# Patient Record
Sex: Female | Born: 1955 | Race: White | Hispanic: No | Marital: Married | Smoking: Never smoker
Health system: Southern US, Community
[De-identification: ages and names within clinical notes are randomized; demographics above are authoritative.]

## PROBLEM LIST (undated history)

## (undated) DIAGNOSIS — I209 Angina pectoris, unspecified: Secondary | ICD-10-CM

## (undated) DIAGNOSIS — K529 Noninfective gastroenteritis and colitis, unspecified: Secondary | ICD-10-CM

## (undated) DIAGNOSIS — R609 Edema, unspecified: Secondary | ICD-10-CM

## (undated) DIAGNOSIS — N390 Urinary tract infection, site not specified: Secondary | ICD-10-CM

## (undated) DIAGNOSIS — N3020 Other chronic cystitis without hematuria: Secondary | ICD-10-CM

## (undated) DIAGNOSIS — D649 Anemia, unspecified: Secondary | ICD-10-CM

## (undated) DIAGNOSIS — I639 Cerebral infarction, unspecified: Secondary | ICD-10-CM

## (undated) DIAGNOSIS — L631 Alopecia universalis: Secondary | ICD-10-CM

## (undated) DIAGNOSIS — J069 Acute upper respiratory infection, unspecified: Secondary | ICD-10-CM

## (undated) DIAGNOSIS — K219 Gastro-esophageal reflux disease without esophagitis: Secondary | ICD-10-CM

## (undated) DIAGNOSIS — F419 Anxiety disorder, unspecified: Secondary | ICD-10-CM

## (undated) DIAGNOSIS — Z5189 Encounter for other specified aftercare: Secondary | ICD-10-CM

## (undated) DIAGNOSIS — I8390 Asymptomatic varicose veins of unspecified lower extremity: Secondary | ICD-10-CM

## (undated) DIAGNOSIS — F32A Depression, unspecified: Secondary | ICD-10-CM

## (undated) DIAGNOSIS — M1990 Unspecified osteoarthritis, unspecified site: Secondary | ICD-10-CM

## (undated) DIAGNOSIS — Z9889 Other specified postprocedural states: Secondary | ICD-10-CM

## (undated) DIAGNOSIS — R51 Headache: Secondary | ICD-10-CM

## (undated) DIAGNOSIS — G459 Transient cerebral ischemic attack, unspecified: Secondary | ICD-10-CM

## (undated) DIAGNOSIS — R112 Nausea with vomiting, unspecified: Secondary | ICD-10-CM

## (undated) DIAGNOSIS — E7800 Pure hypercholesterolemia, unspecified: Secondary | ICD-10-CM

## (undated) DIAGNOSIS — F329 Major depressive disorder, single episode, unspecified: Secondary | ICD-10-CM

## (undated) HISTORY — PX: ABDOMINAL HYSTERECTOMY: SHX81

---

## 1977-07-25 DIAGNOSIS — IMO0001 Reserved for inherently not codable concepts without codable children: Secondary | ICD-10-CM

## 1977-07-25 DIAGNOSIS — Z5189 Encounter for other specified aftercare: Secondary | ICD-10-CM

## 1977-07-25 HISTORY — DX: Encounter for other specified aftercare: Z51.89

## 1977-07-25 HISTORY — DX: Reserved for inherently not codable concepts without codable children: IMO0001

## 2003-08-22 ENCOUNTER — Inpatient Hospital Stay (HOSPITAL_COMMUNITY): Admission: RE | Admit: 2003-08-22 | Discharge: 2003-08-26 | Payer: Self-pay | Admitting: Orthopedic Surgery

## 2003-09-01 ENCOUNTER — Encounter: Admission: RE | Admit: 2003-09-01 | Discharge: 2003-09-01 | Payer: Self-pay | Admitting: Family Medicine

## 2003-10-02 ENCOUNTER — Inpatient Hospital Stay (HOSPITAL_COMMUNITY): Admission: RE | Admit: 2003-10-02 | Discharge: 2003-10-04 | Payer: Self-pay | Admitting: Orthopedic Surgery

## 2003-11-14 ENCOUNTER — Ambulatory Visit (HOSPITAL_BASED_OUTPATIENT_CLINIC_OR_DEPARTMENT_OTHER): Admission: RE | Admit: 2003-11-14 | Discharge: 2003-11-14 | Payer: Self-pay | Admitting: Orthopedic Surgery

## 2003-11-14 ENCOUNTER — Ambulatory Visit: Admission: RE | Admit: 2003-11-14 | Discharge: 2003-11-14 | Payer: Self-pay | Admitting: Orthopedic Surgery

## 2003-12-08 ENCOUNTER — Ambulatory Visit (HOSPITAL_BASED_OUTPATIENT_CLINIC_OR_DEPARTMENT_OTHER): Admission: RE | Admit: 2003-12-08 | Discharge: 2003-12-08 | Payer: Self-pay | Admitting: Orthopedic Surgery

## 2004-07-12 ENCOUNTER — Ambulatory Visit: Payer: Self-pay | Admitting: Family Medicine

## 2004-07-25 HISTORY — PX: JOINT REPLACEMENT: SHX530

## 2006-04-19 ENCOUNTER — Inpatient Hospital Stay (HOSPITAL_COMMUNITY): Admission: RE | Admit: 2006-04-19 | Discharge: 2006-04-22 | Payer: Self-pay | Admitting: Orthopedic Surgery

## 2006-07-07 ENCOUNTER — Ambulatory Visit: Payer: Self-pay | Admitting: Family Medicine

## 2007-02-15 ENCOUNTER — Inpatient Hospital Stay: Payer: Self-pay | Admitting: Internal Medicine

## 2007-03-13 ENCOUNTER — Ambulatory Visit: Payer: Self-pay | Admitting: Family Medicine

## 2008-02-21 ENCOUNTER — Ambulatory Visit: Payer: Self-pay | Admitting: Family Medicine

## 2008-05-27 ENCOUNTER — Emergency Department: Payer: Self-pay | Admitting: Emergency Medicine

## 2009-04-17 ENCOUNTER — Ambulatory Visit: Payer: Self-pay | Admitting: General Practice

## 2009-04-23 ENCOUNTER — Ambulatory Visit: Payer: Self-pay | Admitting: General Surgery

## 2009-04-30 ENCOUNTER — Inpatient Hospital Stay: Payer: Self-pay | Admitting: General Surgery

## 2009-07-25 HISTORY — PX: CHOLECYSTECTOMY: SHX55

## 2010-04-02 ENCOUNTER — Ambulatory Visit
Admission: RE | Admit: 2010-04-02 | Discharge: 2010-04-02 | Payer: Self-pay | Source: Home / Self Care | Admitting: Orthopedic Surgery

## 2010-07-25 HISTORY — PX: JOINT REPLACEMENT: SHX530

## 2010-07-29 LAB — TYPE AND SCREEN
ABO/RH(D): A NEG
Antibody Screen: NEGATIVE

## 2010-07-29 LAB — DIFFERENTIAL
Basophils Absolute: 0 10*3/uL (ref 0.0–0.1)
Basophils Relative: 0 % (ref 0–1)
Eosinophils Absolute: 0.2 10*3/uL (ref 0.0–0.7)
Eosinophils Relative: 2 % (ref 0–5)
Lymphocytes Relative: 36 % (ref 12–46)
Lymphs Abs: 3.1 10*3/uL (ref 0.7–4.0)
Monocytes Absolute: 0.6 10*3/uL (ref 0.1–1.0)
Monocytes Relative: 7 % (ref 3–12)
Neutro Abs: 4.6 10*3/uL (ref 1.7–7.7)
Neutrophils Relative %: 54 % (ref 43–77)

## 2010-07-29 LAB — URINALYSIS, ROUTINE W REFLEX MICROSCOPIC
Bilirubin Urine: NEGATIVE
Hemoglobin, Urine: NEGATIVE
Ketones, ur: NEGATIVE mg/dL
Leukocytes, UA: NEGATIVE
Nitrite: POSITIVE — AB
Protein, ur: NEGATIVE mg/dL
Specific Gravity, Urine: 1.021 (ref 1.005–1.030)
Urine Glucose, Fasting: NEGATIVE mg/dL
Urobilinogen, UA: 0.2 mg/dL (ref 0.0–1.0)
pH: 7.5 (ref 5.0–8.0)

## 2010-07-29 LAB — COMPREHENSIVE METABOLIC PANEL
ALT: 16 U/L (ref 0–35)
AST: 20 U/L (ref 0–37)
Albumin: 4 g/dL (ref 3.5–5.2)
Alkaline Phosphatase: 104 U/L (ref 39–117)
BUN: 16 mg/dL (ref 6–23)
CO2: 27 mEq/L (ref 19–32)
Calcium: 9.3 mg/dL (ref 8.4–10.5)
Chloride: 105 mEq/L (ref 96–112)
Creatinine, Ser: 0.88 mg/dL (ref 0.4–1.2)
GFR calc Af Amer: >60 mL/min (ref >60)
GFR calc non Af Amer: >60 mL/min (ref >60)
Glucose, Bld: 77 mg/dL (ref 70–99)
Potassium: 3.1 mEq/L — ABNORMAL LOW (ref 3.5–5.1)
Sodium: 141 mEq/L (ref 135–145)
Total Bilirubin: 0.4 mg/dL (ref 0.3–1.2)
Total Protein: 7.3 g/dL (ref 6.0–8.3)

## 2010-07-29 LAB — CBC
HCT: 39.5 % (ref 36.0–46.0)
Hemoglobin: 13 g/dL (ref 12.0–15.0)
MCH: 30.9 pg (ref 26.0–34.0)
MCHC: 32.9 g/dL (ref 30.0–36.0)
MCV: 93.8 fL (ref 78.0–100.0)
Platelets: 304 10*3/uL (ref 150–400)
RBC: 4.21 MIL/uL (ref 3.87–5.11)
RDW: 14 % (ref 11.5–15.5)
WBC: 8.5 10*3/uL (ref 4.0–10.5)

## 2010-07-29 LAB — APTT: aPTT: 28 seconds (ref 24–37)

## 2010-07-29 LAB — URINE MICROSCOPIC-ADD ON

## 2010-07-29 LAB — PROTIME-INR
INR: 0.9 (ref 0.00–1.49)
Prothrombin Time: 12.4 seconds (ref 11.6–15.2)

## 2010-08-02 ENCOUNTER — Inpatient Hospital Stay (HOSPITAL_COMMUNITY)
Admission: RE | Admit: 2010-08-02 | Discharge: 2010-08-06 | Payer: Self-pay | Source: Home / Self Care | Attending: Orthopedic Surgery | Admitting: Orthopedic Surgery

## 2010-08-09 LAB — CBC
HCT: 34.1 % — ABNORMAL LOW (ref 36.0–46.0)
HCT: 29.8 % — ABNORMAL LOW (ref 36.0–46.0)
Hemoglobin: 11 g/dL — ABNORMAL LOW (ref 12.0–15.0)
Hemoglobin: 9.8 g/dL — ABNORMAL LOW (ref 12.0–15.0)
MCH: 30.2 pg (ref 26.0–34.0)
MCH: 31.1 pg (ref 26.0–34.0)
MCHC: 32.3 g/dL (ref 30.0–36.0)
MCHC: 32.9 g/dL (ref 30.0–36.0)
MCV: 93.7 fL (ref 78.0–100.0)
MCV: 94.6 fL (ref 78.0–100.0)
Platelets: 314 10*3/uL (ref 150–400)
Platelets: 261 10*3/uL (ref 150–400)
RBC: 3.64 MIL/uL — ABNORMAL LOW (ref 3.87–5.11)
RBC: 3.15 MIL/uL — ABNORMAL LOW (ref 3.87–5.11)
RDW: 13.7 % (ref 11.5–15.5)
RDW: 14 % (ref 11.5–15.5)
WBC: 11.3 10*3/uL — ABNORMAL HIGH (ref 4.0–10.5)
WBC: 9.2 10*3/uL (ref 4.0–10.5)

## 2010-08-09 LAB — BASIC METABOLIC PANEL
BUN: 9 mg/dL (ref 6–23)
BUN: 9 mg/dL (ref 6–23)
CO2: 32 mEq/L (ref 19–32)
CO2: 30 mEq/L (ref 19–32)
Calcium: 8.8 mg/dL (ref 8.4–10.5)
Calcium: 8.2 mg/dL — ABNORMAL LOW (ref 8.4–10.5)
Chloride: 98 mEq/L (ref 96–112)
Chloride: 101 mEq/L (ref 96–112)
Creatinine, Ser: 0.9 mg/dL (ref 0.4–1.2)
Creatinine, Ser: 1.07 mg/dL (ref 0.4–1.2)
GFR calc Af Amer: >60 mL/min (ref >60)
GFR calc Af Amer: >60 mL/min (ref >60)
GFR calc non Af Amer: >60 mL/min (ref >60)
GFR calc non Af Amer: 53 mL/min — ABNORMAL LOW (ref >60)
Glucose, Bld: 122 mg/dL — ABNORMAL HIGH (ref 70–99)
Glucose, Bld: 121 mg/dL — ABNORMAL HIGH (ref 70–99)
Potassium: 3.1 mEq/L — ABNORMAL LOW (ref 3.5–5.1)
Potassium: 3.2 mEq/L — ABNORMAL LOW (ref 3.5–5.1)
Sodium: 140 mEq/L (ref 135–145)
Sodium: 139 mEq/L (ref 135–145)

## 2010-08-09 LAB — PROTIME-INR
INR: 0.96 (ref 0.00–1.49)
INR: 1.46 (ref 0.00–1.49)
INR: 3.09 — ABNORMAL HIGH (ref 0.00–1.49)
INR: 3.45 — ABNORMAL HIGH (ref 0.00–1.49)
Prothrombin Time: 13 seconds (ref 11.6–15.2)
Prothrombin Time: 17.9 seconds — ABNORMAL HIGH (ref 11.6–15.2)
Prothrombin Time: 31.9 seconds — ABNORMAL HIGH (ref 11.6–15.2)
Prothrombin Time: 34.7 seconds — ABNORMAL HIGH (ref 11.6–15.2)

## 2010-08-13 NOTE — Op Note (Signed)
Donna Marsh, Donna Marsh           ACCOUNT NO.:  [id number]  MEDICAL RECORD NO.:  [id number]          PATIENT TYPE:  INP  LOCATION:  5032                         FACILITY:  [facility]  PHYSICIAN:  Harvie Junior, M.D.   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  PROCEDURES: 1. Right total knee replacement with a sigma system size 2 femur, size     2 tibia, 10-mm bridging bearing and a 32-mm all polyethylene     patella. 2. Computer-assisted total knee replacement, right knee.  SURGEON:  Harvie Junior, MD.  ASSISTANT:  [name], PA.  ANESTHESIA:  General.  BRIEF HISTORY:  Ms. Jester is a 55 year old female with a long history of having a significant complaints of pain in her right knee. She underwent knee arthroscopy which showed that she had sort of a severe grade 4 change on the medial femoral condyle.  We treated postoperative, but really not able to get a reasonable pain relief and because of continued complaints of pain in the right knee and failure of all conservative care including arthroscopy, activity modification, injection therapy, the patient was also taken to the operating room for total knee replacement.  Because of her young age and need for excellent alignment, the computer-assisted total knee replacement was considered and this was chosen to be used preoperatively.  PROCEDURE IN DETAILS:  The patient was brought to the operating room. After adequate anesthesia was obtained using general anesthetic, the patient was placed supine on the operating room table.  The right leg was then prepped and draped in the usual sterile fashion.  Following this, the leg was exsanguinated, the blood pressure tourniquet was inflated to 350  mmHg.  Following this, a midline incision was made, subcutaneous tissue  down to the level of the extensor mechanism and a medial parapatellar arthrotomy was undertaken.  Once this was done, the mediolateral meniscus were excised, anterior and posterior cruciates, retropatellar fat pad and the synovium off the anterior femur.  Once this was done, attention was turned towards placement with computer, 2 passes of the tibia, 2 pins in the femur.  This was followed by the registration process which adds about 30 minutes to the surgical procedure.  Once this was completed, the tibia was cut perpendicular to its long axis.  The femur was then cut perpendicular to the anatomic axis and spacer block was put in place.  Once this was completed, attention was turned towards placement of sizing the femur size to a 2- 1/2.  We went through this with a 2-1/2 block, sized the tibia size to a 2 and  10-mm bridging bearing was placed with a little bit tight in flexion I thought.  At that point I felt like I really could certainly downsize to a 2, so we kind a put 2 block back on the femur, cut it posteriorly, cut it on the chamfers and once that was completed, we size  trialed with a 2 which was a much better fit mediolateral, but also anterior and posterior gave [place] a little bit better around.  Once this was completed, the knee was put through a range of motion, felt to be stable in all directions.  The patella was then cut and the computer showed [place] perfect neutral long alignment.  Attention was turned to the patella where that was cut down to a level of 13 mm and a 32 poly was chosen.  We got to overhung just slightly and then the patella was a very narrow on one area and it was that the hole could only be drilled to about 10 mm down.  So at this point we only opened the final poly, we went ahead and took a couple of millimeters off of that 1 peg.  Once this was completed, attention was then turned  towards the removal of trials.  The knee was copiously and thoroughly lavaged and suctioned dry.  The tibia was then cemented into place, size 2 tibia, size 2 femur, 10-mm bridging bearing and a 32-mm all poly patella was cemented in place and following this a clamp was placed.  Cement was allowed to harden.  A trial 10 spacer was used.  We allowed the cement to harden while it was held in full extension.  The computer was checked at this point, perfect neutral long alignment, gap balance.  Computer module was removed at this point.  Cement was allowed to harden.  The tourniquet was let down.  All bleeding was controlled with electrocautery.  Trial poly was removed and the final poly was placed.  Range of motion, excellent stability range of motion.  At this point attention was turned towards the closure.  It was closed with 1 Vicryl running up and down, skin with 2-0 and 3-0 Monocryl subcuticular and skin stapler.  Medium Hemovac drain was placed.  Sterile compressive dressing was applied and the patient was taken to the recovery and was noted to be in satisfactory condition.  Estimated blood loss from procedure was less than 25 mL.   Harvie Junior, M.D.     Ranae Plumber  D:  08/02/2010  T:  08/03/2010  Job:  [id number]  Electronically Signed by Jodi Geralds M.D. on 08/12/2010 08:59:42 PM

## 2010-08-19 LAB — SURGICAL PCR SCREEN
MRSA, PCR: NEGATIVE
Staphylococcus aureus: POSITIVE — AB

## 2010-09-09 NOTE — Discharge Summary (Signed)
NAMENOBIE, ALLEYNE           ACCOUNT NO.:  [id number]  MEDICAL RECORD NO.:  [id number]          PATIENT TYPE:  INP  LOCATION:  5032                         FACILITY:  [facility]  PHYSICIAN:  Harvie Junior, M.D.   DATE OF BIRTH:  [birth year]  DATE OF ADMISSION:  08/02/2010 DATE OF DISCHARGE:  08/06/2010                              DISCHARGE SUMMARY   ADMITTING DIAGNOSES: 1. End-stage degenerative joint disease, right knee. 2. Hypertension. 3. History of chronic kidney infections. 4. History of depression and anxiety.  DISCHARGE DIAGNOSES: 1. End-stage degenerative joint disease, right knee. 2. Hypertension. 3. History of chronic kidney infections. 4. History of depression and anxiety. 5. Hypokalemia.  PROCEDURES IN HOSPITAL:  Right total knee arthroplasty, computer- assisted by Jodi Geralds, MD, August 02, 2010.  BRIEF HISTORY:  Cris is a 55 year old female who has a history of work injury to her right knee.  She had a right knee arthroscopy in September 2011, which showed marked severe degenerative changes, grade 4 particularly in the patellofemoral joint as well in the medial compartment.  She postoperatively had continued pain in her right knee with night pain and pain with ambulation.  She did not improve with exhaustive conservative treatment including injection therapy, modification activity, and oral medication.  Plain radiographs, weightbearing showed that she had bone-on-bone changes in the right knee consistent with end-stage degenerative joint disease.  Based upon her clinical and radiographic findings, she is felt to be a candidate for a right total knee replacement and is admitted for this.  PERTINENT LABORATORY STUDIES:  Chest x-ray on July 29, 2010, showed no acute cardiopulmonary disease.  The patient's hemoglobin on admission was 13.0, hematocrit 39.5, WBC 8.5.  on postop day #1, her hemoglobin was 11.0, postop day #2 hemoglobin was 9.8.  Platelet  count was in normal range at 304.  Indices were within normal range on admission. Protime on admission was 12.4 seconds and INR of 0.90 with a PTT of 28. On the date of discharge, her protime was 34.7 seconds with an INR of 3.45 on Coumadin therapy.  CMET on admission showed a slightly decreased potassium of 3.1 otherwise within normal range.  Her BMET was followed x2 days postop and was in normal range other than slightly decreased potassium of 3.1 and 3.2 on August 03, 2010, and August 04, 2010.  The GFR was greater than 60.  Urinalysis on admission was positive for nitrites and had just few bacteria.  HOSPITAL COURSE:  The patient was admitted to [facility] on August 02, 2010.  Preop, was given gentamicin 80 mg IV and Ancef 1g. She was brought to the operating room where she underwent right total knee replacement, computer-assisted, as well described in Dr. Luiz Blare' operative note.  Postoperatively, she was put on the PCA morphine pump for pain control.  IV fluids were instituted.  She was on a regular diet, was started on Coumadin for DVT prophylaxis.  CPM machine was used for right knee range of motion and physical therapy was ordered for walker ambulation, weightbearing as tolerated on the right.  On postoperative day #1, she had moderate right knee pain.  She was taking fluids and voiding without difficulty.  The Foley catheter, which was placed at time of surgery was removed.  She is out of bed to the chair. Vital signs were stable.  Her hemoglobin was 11.  Her potassium was 3.1. She was felt to need 20 mEq of potassium supplementation orally b.i.d. This was ordered.  We continued to follow her BMET while she was in the hospital.  She is continued on the IV fluids and the PCA morphine as well as Coumadin.  On postop day #2, she was overall doing relatively well.  She complained of right knee pain.  She was taking p.o. without difficulty and voiding without difficulty.   Her potassium was 3.2.  Her INR was 1.46.  Her right knee dressing was changed and her Hemovac drain was pulled.  Her PCA morphine pump was discontinued.  Her IV was converted to saline lock.  She on postop day #3 was overall making good progress, but still needed additional therapy for safety issues prior to going home.  Vital signs are stable.  She is afebrile.  She was continued on the oral potassium.  On postop day #4, she was doing well. She was without complaints.  She made good progress with physical therapy.  Her right knee wound was benign.  Her calf was soft, nontender.  INR was 3.45.  Her saline lock was discontinued.  She was discharged home in improved condition.  She was on a regular diet.  She will need home health physical therapy and a home health RN with a home CPM.  Her activity status to be weightbearing as tolerated on the right with a walker.  She also need a home CPM machine.  MEDICATIONS AT DISCHARGE: 1. Xanax 1 mg t.i.d. 2. Oxaprozin 600 mg b.i.d. 3. Venlafaxine XR 150 mg daily. 4. Triamterene/hydrochlorothiazide 37.5/25 mg daily. 5. Estrace 1 mg daily. 6. Over-the-counter potassium 1 tablet daily. 7. Percocet 5 mg 1-2 q.6 h. p.r.n. pain. 8. Robaxin 750 mg 1 every 8 hours as needed for spasm. 9. Coumadin as directed per pharmacy x1 month postop for DVT     prophylaxis.  She will follow up with Dr. Luiz Blare in 10-14 days in the office, sooner if any problems occur.     [name], P.A.   ______________________________ Harvie Junior, M.D.    JB/MEDQ  D:  09/01/2010  T:  09/02/2010  Job:  [id number]  Electronically Signed by [name] P.A. on 09/03/2010 04:22:49 PM Electronically Signed by Jodi Geralds M.D. on 09/09/2010 03:43:32 PM

## 2010-10-07 LAB — BASIC METABOLIC PANEL
BUN: 15 mg/dL (ref 6–23)
CO2: 31 mEq/L (ref 19–32)
Calcium: 9.3 mg/dL (ref 8.4–10.5)
Chloride: 99 mEq/L (ref 96–112)
Creatinine, Ser: 0.95 mg/dL (ref 0.4–1.2)
GFR calc Af Amer: >60 mL/min (ref >60)
GFR calc non Af Amer: >60 mL/min (ref >60)
Glucose, Bld: 86 mg/dL (ref 70–99)
Potassium: 3 mEq/L — ABNORMAL LOW (ref 3.5–5.1)
Sodium: 139 mEq/L (ref 135–145)

## 2010-12-10 NOTE — Discharge Summary (Signed)
Donna Marsh, Donna Marsh           ACCOUNT NO.:  [id number]   MEDICAL RECORD NO.:  [id number]          PATIENT TYPE:  INP   LOCATION:  5034                         FACILITY:  [facility]   PHYSICIAN:  [name], P.A.    DATE OF BIRTH:  1955/11/05   DATE OF ADMISSION:  04/19/2006  DATE OF DISCHARGE:  04/22/2006                                 DISCHARGE SUMMARY   ADMITTING DIAGNOSES:  1. Painful left knee status post total knee replacement January 2005.  2. 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  3. Hypertension.  4. Depression/anxiety.   DISCHARGE DIAGNOSES:  1. Painful left knee status post total knee replacement January 2007.  2. 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  3. Hypertension.  4. Depression/anxiety.  5. Hypokalemia.   PROCEDURES IN HOSPITAL:  Left total knee revision with revision of patellar  component, lateral retinacular release, and replacement of tibial  polyethylene component with extensive synovectomy left knee, Jodi Geralds,  April 19, 2006.   BRIEF HISTORY:  Mr. Divelbiss is a 55 year old patient who had a work injury  to her left knee.  She ultimately had a left total knee replacement done in  January 2005.  She did well after extensive physical therapy and recently  had onset of left knee pain and grinding.  She had a tilted patella on her  sunrise view and continued complaints of pain and swelling in her knee.  We  aspirated her knee which showed no sign of infection.  It was felt that she  had some patellar malalignment and possibly a loose prosthesis, and based  upon her clinical radiographic findings she was admitted for left total knee  revision.   PERTINENT LABORATORY STUDIES:  EKG on April 13, 2006 showed normal sinus  rhythm.  Postoperative x-ray of the left knee showed well-seated components  of a total knee prosthesis.  Hemoglobin on admission was 14.8, hematocrit  48.0, indices with normal limits.   On postop day number 1, her hemoglobin  was 11.6, on postoperative day number 2 was 10.1, postoperative day 3 was  10.6.  Her pro time was 12.5.  __________ on admission with INR 0.9.  On the  date of discharge, her INR was 2.4 on Coumadin.  On postop day number 1, her  potassium was 3.1, postop day 2 was 3.0, postop day 3 was 3.1, postop day 4  was 3.8.  Urinalysis on admission showed no abnormalities.  Cultures  intraoperatively from the left knee showed no growth at 3 days and no  anaerobes isolated.   HOSPITAL COURSE:  The patient underwent left total knee revision as well  described in Dr. Luiz Blare' operative note on April 19, 2006.  On postop  day number one, she had moderate knee pain, she was taking fluids without  difficulty, her Foley catheter postop surgery was intact.  She was on a PCA  morphine pump for pain control and IV fluids along with IV Ancef.  Her  hemoglobin was 11.6, potassium was decreased at 3.0, INR was 1.0.  She was  felt to have hypokalemia and oral potassium was added.  Her blood pressure  medications were held.   On postop day 2, she had still low potassium at 3.1, and she was given oral  potassium.  She had moderate knee pain.  Her hemoglobin was 10.1, her INR  was 1.6.  Her left knee wound was benign.  Her range of motion was 0 degrees  to 70 degrees.  Her PCA morphine pump was discontinued.  Her potassium was a  bit low, so we gave her a 500 mL bolus of IV fluid.  Foley catheter was  discontinued.  The Hemovac drain was pulled.  She was discontinued home on  postop day number three.  She was stating, I am ready to go home.  She was  taking p.o. fluid without difficulty, ambulating well.  Her vital signs, her  temperature was afebrile.  Her left knee dressing was clean and dry.  Hemoglobin was stable, and her potassium was stable at 3.8.  Her INR was  therapeutic at 2.4.  The IV was discontinued, and she was discharged home in  improved condition.  She  was given a prescription for Coumadin x1 month  postop for DVT prophylaxis, Percocet 5 mg p.r.n. for pain, and she was  discharged to ambulate weightbearing as tolerated on the left foot with  walker.  She will need home health physical therapy three times a week and  home health RN for pro times and Coumadin management, and home CPM x10 days.  She will follow up with Dr. Luiz Blare in the office in ten days.  We really  encouraged range of motion.  She can stop her oral potassium at this point.      [name], P.A.     JB/MEDQ  D:  05/31/2006  T:  06/01/2006  Job:  [id number]

## 2010-12-10 NOTE — Op Note (Signed)
NAMETYSHIA, FENTER           ACCOUNT NO.:  [id number]   MEDICAL RECORD NO.:  [id number]          PATIENT TYPE:  INP   LOCATION:  2550                         FACILITY:  [facility]   PHYSICIAN:  Harvie Junior, M.D.   DATE OF BIRTH:  06/01/1956   DATE OF PROCEDURE:  04/19/2006  DATE OF DISCHARGE:                                 OPERATIVE REPORT   PREOPERATIVE DIAGNOSIS:  Painful left total knee, status post total knee  replacement.   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   PRINCIPLE PROCEDURES:  1. Revision of patellar component.  2. Lateral retinacular release, open.  3. Exchange of the polyethylene liner.   SURGEON:  Harvie Junior, M.D.   ASSISTANT:  [name], P.A.   ANESTHESIA:  General.   BRIEF HISTORY:  Miss Burnstein is a 55 year old female with a long history  of having had a total knee several years ago.  She had a tremendous  difficulty getting any moving early on, with difficulty with flexion and  extension.  She had to be stress casted.  She had to be manipulated.  When  all these things were completed, the patient ultimately had improved motion  of about 5 to 90 degrees, and over a couple of years, she actually improved  that range of motion.  She did reasonably well up until about a year ago,  when she began having increasing pain.  It was unclear exactly why, because   Dictation ended at this point.      Harvie Junior, M.D.  Electronically Signed     JLG/MEDQ  D:  04/19/2006  T:  04/19/2006  Job:  [id number]

## 2010-12-10 NOTE — Op Note (Signed)
Donna Marsh           ACCOUNT NO.:  [id number]   MEDICAL RECORD NO.:  [id number]          PATIENT TYPE:  INP   LOCATION:  5284                         FACILITY:  [facility]   PHYSICIAN:  Harvie Junior, M.D.   DATE OF BIRTH:  April 28, 1956   DATE OF PROCEDURE:  04/19/2006  DATE OF DISCHARGE:                                 OPERATIVE REPORT   PREOPERATIVE DIAGNOSIS:  Painful left total knee, status post left total  knee replacement.   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   PRINCIPLE PROCEDURES:  1. Revision of patellar component.  2. Lateral retinacular release, open.  3. Revision of tibial component by way of polyethylene exchange.   SURGEON:  Harvie Junior, M.D.   ASSISTANT:  [name], P.A.   NOTE:  This is a repeat dictation; date of the repeat dictation would be  04/20/2006; date of the original dictation was 04/19/2006.   ANESTHESIA:  General.   BRIEF HISTORY:  Donna Marsh is a 55 year old female with a long history  of having had a total knee replacement several years ago.  She had  tremendous difficulty getting any movement early on, with difficulty in  flexion and extension.  She had to be stress casted, as well as manipulated.  When all these things were completed, the patient ultimately had improved  motion of about 5 to 90 degrees.  There were a couple of years she actually  improved her range of motion.  She did reasonably well up until about a year  ago, when she began having increasing pain.  It is unclear why.  We worked  her up thoroughly, including bone scan, which suggested there was a  possibility of loosening of her components.  At any rate, we talked about  treatment options and ultimately felt that an open synovectomy and revision  of patellar component may be appropriate, and she is brought to the  operating room for this procedure.  If we had found a loose  tibial  component, we felt like we may or may not revise the patella, depending on  its tracking situation intraoperatively.  At any rate, she is brought to the  operating room for this procedure.   PROCEDURE:  The patient was brought to the operating room, and after  adequate anesthesia was obtained with a general anesthetic, the patient was  placed supine on the operating table.  The left leg was then prepped and  draped in the usual sterile fashion.  Fluoro imaging had been used  preoperatively and we looked for any rock of the tibia; did not see any.  We  looked at the tracking of the patellar and it showed that it was certainly  tight on the lateral side.  At this point, the patient was prepped and  draped after fluoro imaging, and the leg was exsanguinated.  Blood pressure  tourniquet was inflated to 350 mmHg.  Following this, a midline incision was  made.  Subcutaneous tissues were dissected down to the level of the old  incision.  A  medial parapatellar arthrotomy was undertaken and the exposure  was made around the medial side.  We did a massive and aggressive  synovectomy, including what was an unbelievable amount of rind and scar  tissue around the patellar button, in particular, on the lateral side, where  she was very, very tight on that lateral side.  At any rate, once this was  completed, the components were identified, the tibia and the femur.  I put a  drift punch on them, looked at the interfaces, tried to wiggle them with the  hammers.  Nothing seemed to move these. We think they were really very well  aligned.  At that point, we turned to the patella and looked at its  tracking.  We actually closed the retinaculum with some towel clips, and I  could sneak my finger in there.  It was clear this was tracking in a lateral  direction and was losing contact on the medial side, and I think that is  where a lot of her pain was coming from.  At that point, we felt that   revision of the patellar button, to go to a bigger size, would be of  benefit; and we took out a 32-mm button, redrilled the holes, and then  ultimately did  clean-up cut and cemented a 35-mm all-poly patella in place.  The tibial component was revised by way of exchanging the poly, and an  aggressive synovectomy was performed around it.  At this point, we did begin  with the range of motion with some towel clips, and there still was liftoff  with no medial contact of the patellar button.  So, at this point, what we  thought was appropriate was to do a lateral retinacular release.  This was  performed within the knee, and once this was accomplished, now excellent  dead-on midline patellar tracking was accomplished.  At this point, there  was really minimal pain to range of motion.  There was no instability.  We  at this point did a closure of the medial retinaculum with a 1 Vicryl  running, locking suture, and the skin was closed with 0 and 2-0 Vicryl, and  a 3-0 Maxon pull-out suture.  Benzoin and Steri-Strips were applied.  A  sterile compressive dressing was applied, and the patient was taken to the  recovery room, where she was noted to be in satisfactory condition.  Estimated blood loss for the procedure was none.  The tourniquet was let  down during the case, and all bleeding was controlled with electrocautery.      Harvie Junior, M.D.  Electronically Signed     JLG/MEDQ  D:  04/20/2006  T:  04/20/2006  Job:  [id number]

## 2010-12-10 NOTE — Op Note (Signed)
NAME:  Donna Marsh, Donna (ROBIN)               ACCOUNT NO.:  [id number]   MEDICAL RECORD NO.:  [id number]                   PATIENT TYPE:  AMB   LOCATION:  DSC                                  FACILITY:  [facility]   PHYSICIAN:  Harvie Junior, M.D.                DATE OF BIRTH:  February 02, 1956   DATE OF PROCEDURE:  12/08/2003  DATE OF DISCHARGE:                                 OPERATIVE REPORT   PREOPERATIVE DIAGNOSES:  Stiff right knee, status post total knee  replacement, status post manipulation, status post manipulation and neck  casting.   POSTOPERATIVE DIAGNOSES:  Stiff right knee, status post total knee  replacement, status post manipulation, status post manipulation and neck  casting.   PROCEDURE:  1. Removal of cast, long-leg cylinder.  2. Manipulation of the knee under anesthesia.  3. Application of a JAS splint.   SURGEON:  Harvie Junior, M.D.   ASSISTANT:  [name], P.A.   ANESTHESIA:  General.   INDICATIONS FOR PROCEDURE:  The patient is a 55 year old female with a long  history of having a painful left knee.  She ultimately underwent a knee  replacement and had significant issues with mobility postoperatively because  of problems with pain and pain control.  She was not able to get her knee  bending.  Ultimately she became stiff and underwent a manipulation where she  was initially able to maintain her motion, but then was not able to.  She  had a range of motion of about 30-80, and we ultimately felt that we needed  to get her straight.  We casted her in a cylinder cast straight after  manipulation for three weeks and a few days, and brought her back today for  removal of her cast and manipulation.   DESCRIPTION OF PROCEDURE:  The patient is brought to the operating room.  After adequate anesthesia was obtained with general anesthetic, the patient  was placed supine on the operating room table.  The left leg was then  removed from the cylinder cast.  It easily  stayed straight.  There was no  need to manipulate straight.  Flexed to about 30, and then with gentle  manipulation were able to go to about 110.  There was no significant  breakage up of scar tissue.  It was just kind of a progressive range of  motion issue, and once this was done the knee could easily go through a  range of motion.  There was no tearing towards coming to full extension of  the knee, and really liked to stay in full extension quite well.  At that  point, the JAS splint was then pulled out of the bag and put on the leg, and  placed the leg into full extension, and this held it easily in extension.  She will be discharged home, to begin aggressive CPM range of motion, and we  will see her back in the office in one  week for a check of her situation.                                               Harvie Junior, M.D.   Ranae Plumber  D:  12/08/2003  T:  12/09/2003  Job:  [id number]

## 2010-12-10 NOTE — Op Note (Signed)
NAME:  Donna Marsh, Donna (ROBIN)               ACCOUNT NO.:  [id number]   MEDICAL RECORD NO.:  [id number]                   PATIENT TYPE:  INP   LOCATION:  5013                                 FACILITY:  [facility]   PHYSICIAN:  Harvie Junior, M.D.                DATE OF BIRTH:  Mar 11, 1956   DATE OF PROCEDURE:  10/01/2003  DATE OF DISCHARGE:  10/04/2003                                 OPERATIVE REPORT   PREOPERATIVE DIAGNOSIS:  Stiff total knee, status post total knee  replacement.   POSTOPERATIVE DIAGNOSIS:  Stiff total knee, status post total knee  replacement.   PRINCIPAL PROCEDURE:  Manipulation of left total knee under anesthesia.   SURGEON:  Harvie Junior, M.D.   SURGEON:  [name], P.A.   ANESTHESIA:  General.   BRIEF HISTORY:  A 55 year old female with a long history of having had  significant knee pain.  She had ultimately undergone knee arthroscopy,  followed by physical therapy and other issues.  She ended up with a stiff,  painful knee.  We ultimately underwent total knee replacement.  She did well  initially but began having significant stiffening following that.  Because  of continued complaints of stiffening, we ultimately brought her to the  operating room for manipulation under anesthesia.   PROCEDURE:  The patient was brought to the operating room and after an  adequate level of anesthesia was obtained with a general anesthetic, the  patient was positioned prone upon the operating table.  The left leg was  prepped and draped in the usual sterile fashion.  Following this the knee  was manipulated under anesthesia, easily coming into about 5 degrees shy of  full extension.  She then manipulated up into flexion and was able to get  120 degrees of flexion without significant problems.  There was breakage of  scar tissue in both extension and flexion, and we then put her into a full-  leg splint and take her to the recovery room to start CPM and be monitored  overnight.                                               Harvie Junior, M.D.    Ranae Plumber  D:  12/01/2003  T:  12/02/2003  Job:  [id number]

## 2010-12-10 NOTE — Op Note (Signed)
NAME:  Donna Marsh, Donna (ROBIN)               ACCOUNT NO.:  [id number]   MEDICAL RECORD NO.:  [id number]                   PATIENT TYPE:  INP   LOCATION:  5017                                 FACILITY:  [facility]   PHYSICIAN:  Harvie Junior, M.D.                DATE OF BIRTH:  Mar 06, 1956   DATE OF PROCEDURE:  08/22/2003  DATE OF DISCHARGE:                                 OPERATIVE REPORT   PREOPERATIVE DIAGNOSIS:  End stage degenerative joint disease, left knee.   POSTOPERATIVE DIAGNOSIS:  End stage degenerative joint disease, left knee.   PROCEDURE:  Left total knee replacement with Laural Benes and Laural Benes Sigma total  knee replacement system, 2.5 mm femur, size 2 tibia, a 12.5 mm bridging  bearing, and a 32 mm all poly patella.   SURGEON:  Harvie Junior, M.D.   ASSISTANT:  [name], P.A.   ANESTHESIA:  General.   BRIEF HISTORY:  She is a  55 year old female with a long history of having  had an injury at work ultimately going onto knee arthroscopy.  At the time  of arthroscopy, she was noted to have significant degenerative change on the  end of the femur and tibia.  She had a second arthroscopy and none of this  seemed to be satisfactory for controlling her pain.  Because of continued  complaints of pain and exposed bone, we talked about treatment options  including unicompartmental knee versus total knee replacement.  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  At this point, the patient was taken to  the operating room for total knee replacement.   DESCRIPTION OF PROCEDURE:  The patient was brought to the operating room and  after adequate anesthesia was obtained with a general anesthetic, the  patient was placed supine on the operating table.  The left knee was prepped  and draped in the usual sterile fashion.  Following Esmarch exsanguination,  the tourniquet inflated to 350 mmHg.  A midline  incision was made and the  subcutaneous tissue were dissected down to the level of the extensor  mechanism which was clearly identified and the medial parapatellar  arthrotomy was undertaken.  At this point, the patella was everted.  Medial  and lateral meniscectomies were performed as well as anterior and posterior  cruciate excisions and the tibia was cut perpendicular to its long axis with  a 0 degree slope.   The attention was turned to the distal femur where a pilot hole was drilled  and the 5 degree distal valgus cut was made and the spacer blocks were then  put in place.  An additional 2 mm were taken off the femur, at this point,  as well as 2 off the tibia, to allow the 12 mm shim to be put in for the  patient to come to full extension.  At this point, attention was turned to  the femoral size where the femur was sized and the anterior and posterior  cuts were made as well as the chamfers and then the box cut was made on the  femoral side.  The trial femur was put into place.  At this point, the  attention was turned to the tibia which was sized and then keeled and  drilled.  A size 2 was put in place and the trial component was put in  place.  Attention was turned to the patella which was originally sized for a  32, 8 mm of patella was taken off, and drilled for a 32 mm patella.  At this  point, the knee was put through a range of motion, excellent fit and fill  and range of motion was achieved.   Attention was turned towards the removal of the trial components.  This was  undertaken.  The knee was copiously irrigated with pulsatile lavage  irrigation and the knee was suctioned dry.  The final components were then  cemented into place.  A size 2.5 femur, size 2 tibia, 12.5 mm bridging  bearing, and 32 mm all poly patella.  The cement was allowed to dry and the  components were in place, the knee was put through a range of motion with  excellent range of motion and stability was  achieved.  A medium Hemovac  drain was put in place.  The medial parapatellar arthrotomy was closed with  a #1 Vicryl running suture.  The subcu was closed with 0 and 3-0 Vicryl and  the skin with 3-0 Maxon pull out suture.  Benzoin and Steri-Strips were  applied.  A sterile compression dressing was applied.  The patient was taken  to the recovery room where she was noted to be in satisfactory condition.  Estimated blood loss for the procedure was none.                                               Harvie Junior, M.D.    Ranae Plumber  D:  08/22/2003  T:  08/22/2003  Job:  [id number]

## 2010-12-10 NOTE — Discharge Summary (Signed)
NAME:  Donna Marsh, Donna Athene (ROBIN)               ACCOUNT NO.:  [id number]   MEDICAL RECORD NO.:  [id number]                   PATIENT TYPE:  INP   LOCATION:  5017                                 FACILITY:  [facility]   PHYSICIAN:  Harvie Junior, M.D.                DATE OF BIRTH:  04/06/1956   DATE OF ADMISSION:  08/22/2003  DATE OF DISCHARGE:  08/26/2003                                 DISCHARGE SUMMARY   ADMISSION DIAGNOSES:  1. End-stage degenerative joint disease, left knee.  2. History of chronic migraine headaches.  3. History of multiple kidney infections, chronic.  4. Alopecia.   DISCHARGE DIAGNOSES:  1. End-stage degenerative joint disease, left knee.  2. History of chronic migraine headaches.  3. History of multiple kidney infections, chronic.  4. Alopecia.  5. Hypokalemia.   PROCEDURES IN HOSPITAL:  Left total knee arthroplasty - Jodi Geralds, M.D. -  August 22, 2003.   BRIEF HISTORY:  Donna Marsh is a 55 year old female who injured her left  knee at work.  She ultimately saw an orthopedist in [place] who did a  left knee arthroscopy on her, which showed grade 4 degenerative joint  disease.  These areas were debrided, but she continued to have pain with  ambulation and night pain and was unable to get relief from her pain.  She  had some difficulty with range of motion after her arthroscopy.  X-rays of  the left knee of the weight-bearing variety showed bone-on-bone radiographic  changes.  Despite injection therapy, physical therapy, use of anti-  inflammatories and modification of her activities, she continued to have  night pain and difficulty ambulating secondary to her knee, and was felt to  be a candidate for a left total knee arthroplasty.  She was admitted for  this.   PERTINENT LABORATORY STUDIES:  EKG on admission showed normal sinus rhythm,  nonspecific ST and T-wave abnormality.  Hemoglobin on admission was 13.8,  hematocrit 39.8.  On postop day number  one, hemoglobin was 10.7, on postop  day number two, it was 10.7.  Protime on admission was 12.6 seconds with INR  of .9.  PTT was 29.  On the day of discharge her protime was 25.5 seconds  with INR of 3.4.  CMET on admission showed slightly decreased potassium at  3.4.  On postop day number one, her potassium was 2.6, postop day number two  it was 2.6, postop day number three it was 3.9.  Liver panel was within  normal limits.  Urinalysis showed no abnormalities.  Cultures from left knee  showed no growth in two days, no anaerobes were isolated.   HOSPITAL COURSE:  The patient underwent left total knee arthroplasty as well  described in Dr. Luiz Blare' operative note.  Surgical procedure went well  without any untoward complications.  Postoperatively the patient was placed  on a PCA morphine pump for pain control.  She was also put on Ancef 1 gram  IV  q.8h x5 doses, and she was started on Coumadin antithrombolytic therapy  per prophylaxis protocol.  CPM machine was also used for knee range of  motion.  On postop day number one she was without significant complaints.  Overall she seemed to have good relief of her pain.  She did have some  nausea.  She was taking fluids without difficulty.  Her vital signs were  stable and she was afebrile.  Her dressing was clean and dry.  Her Hemovac  was in place.  Her potassium was deceased and she was given some rounds of  IV potassium.  Her Foley catheter was discontinued.  On postop day number  two she had moderate knee pain.  Her T-Max was 101.5.  Her potassium  remained at 2.6.  She did have some urinary retention, which required cath.  Her PCA pump was discontinued.  Her IV was converted to a Hep-lock and some  more potassium was given through the IV.  She then was ultimately able to  void without difficulty, but a family practice teaching service consult was  obtained due to her hypokalemia and some alkalosis.  She was started on oral  potassium at  that point.  The patient was noted on postop day number three  to have 3rd degree flexion contracture and was somewhat hesitant to move her  knee fully straight because of the pain.  The patient was stable.  Her CPM  machine was increased and physical therapy was instructed to work  aggressively with knee range of motion and with good maintenance of pain  control.  Her potassium came up to a certainly acceptable level and  suggested continued monitoring of her potassium.  The patient continued to  progress without any significant complications.  On postop day number four  she was doing well, she stated she was ready to go home.  She is taking  fluids and voiding without difficulty.  Her vital signs are stable and she  is afebrile.  Her vascular status is intact distally.  Her knee range of  motion was 1 to 15 degrees and 85 degrees flexion.  Her INR was 3.4, with a  protime of 25.5.  Potassium was 3.5 and sodium was 136.   CONDITION ON DISCHARGE:  She was discharged home in improved condition.   DIET:  Regular.   FOLLOW UP:  She will be started on home health physical therapy three times  a week for knee range of motion.  She will also have a home CPM machine.  Her Coumadin will be used times one month postop for DVT prophylaxis.  She  will start ambulating and weight-bearing as tolerated on the left with a  walker.  She was given RX for [employer] p.r.n. pain, and will continue  on Coumadin times one month postop.  She will follow up with her medical  doctor in about three to four weeks to check her potassium.  She will follow  up with Dr. Luiz Blare in two weeks.  Call if any problems occur.      Donna Marsh, P.A.                       Harvie Junior, M.D.    Cordelia Pen  D:  10/31/2003  T:  11/01/2003  Job:  [id number]   cc:   Dortha Kern, Dr.  Dan Humphreys, [place]

## 2010-12-10 NOTE — Op Note (Signed)
NAME:  Donna Marsh, Donna (ROBIN)               ACCOUNT NO.:  [id number]   MEDICAL RECORD NO.:  [id number]                   PATIENT TYPE:  AMB   LOCATION:  DSC                                  FACILITY:  [facility]   PHYSICIAN:  Harvie Junior, M.D.                DATE OF BIRTH:  [birth year]   DATE OF PROCEDURE:  11/14/2003  DATE OF DISCHARGE:                                 OPERATIVE REPORT   PREOPERATIVE DIAGNOSIS:  Stiff total knee status post failed manipulation  with conservative care, status failed sympathetic blocks, status post failed  aggressive physical therapy with a motion of 30 to 70 degrees.   POSTOPERATIVE DIAGNOSIS:  Stiff total knee status post failed manipulation  with conservative care, status failed sympathetic blocks, status post failed  aggressive physical therapy with a motion of 30 to 70 degrees.   PROCEDURE:  1. Manipulation under anesthesia.  2. Application of long leg cylinder cast.   SURGEON:  Harvie Junior, M.D.   ASSISTANT:  [name], P.A.   ANESTHESIA:  General.   BRIEF HISTORY:  The patient is a 55 year old female with a long history of  having had knee replacement.  She ultimately has had significant trouble  with range of motion.  We ultimately did an admission with an epidural for  manipulation and got her easily from about 5 to 130 degrees.  She was not  able to maintain this after just a short period of time out of the hospital  and because of this, she was ultimately tried for sympathetic blocks.  This  did not work.  Ultimately, she reverted to a range of motion of 20 to 75  degrees and we felt that this was not satisfactory, particularly with  extension, so we brought her to the operating room for manipulation and  extension casting.   DESCRIPTION OF PROCEDURE:  The patient was brought to the operating room and  after adequate anesthesia was obtained with a general anesthetic, the  patient was placed on the operating table.  The left  leg was manipulated  into extension and was able to get to within 5 degrees of full extension.  The leg was manipulated into flexion and easily went to gravity to 90  without any hard end point and then with some slight manipulation was able  to get her to about 125 degrees.  At this point, the leg was then held in  extension and the cylinder cast was applied with an anterior splint with  molding around the femoral condyles and the malleolar area.  The leg was  held in extension and the cast nicely held the leg in extension.  The knee  was casted and the patient was taken to the recovery room where she was  noted to be in satisfactory condition.  Estimated blood loss was none.  Harvie Junior, M.D.    Ranae Plumber  D:  11/14/2003  T:  11/14/2003  Job:  [id number]

## 2010-12-10 NOTE — Discharge Summary (Signed)
NAME:  Donna Marsh, Donna Marsh (ROBIN)               ACCOUNT NO.:  [id number]   MEDICAL RECORD NO.:  [id number]                   PATIENT TYPE:  INP   LOCATION:  5013                                 FACILITY:  [facility]   PHYSICIAN:  Harvie Junior, M.D.                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   PROCEDURES IN HOSPITAL:  1. Closed manipulation, left knee - Jodi Geralds, M.D. - October 01, 2003.  2. Application of epidural Fentanyl catheter - anesthesia department.   BRIEF HISTORY:  Donna Marsh is a 55 year old female who has a history of a  left total knee replacement done on August 22, 2003, after a work injury to  her left knee.  Postoperatively she had decreased range of motion of her  knee despite aggressive physical therapy, use of a continuous passive motion  machine, dynamic splinting and good pain reduction measures using oral  analgesics.  Despite aggressive therapy, which was instituted immediately  after her knee replacement surgery, her range of motion was noted to be only  35 degrees to 90 degrees of flexion.  We felt that this was unacceptable and  the patient was admitted for manipulation of the left knee under general  anesthesia and placement of an epidural Fentanyl catheter postoperatively  for pain control so that we can maintain the range of motion gained at the  time of surgery.  She was admitted for this.  It is of note that plain  radiographs were unremarkable.   PERTINENT LABORATORY STUDIES:  Hemoglobin on admission was 12.2, hematocrit  36.6, indices within normal limits.  Protime was 12.9 seconds  and INR of  1.0.  CMET was entirely normal.   HOSPITAL COURSE:  The patient underwent left knee manipulation.  We were  able to get her knee to zero degrees extension and flexed her back to 120  degrees of flexion.  She was placed in a CPM machine immediately  postoperatively and an epidural Fentanyl catheter was placed at the time of  surgery.  On postop day number one the patient seemed to have good pain  relief.  She was able to wear her JAS splint, which is an dynamic extensive  splint and was easily getting her knee out to zero degrees.  Physical  therapy and occupational therapy was ordered for range of motion of the  knee.  On the afternoon of postop day number one the patient was sitting up  bed and working with physical therapy, started complaining of a headache and  decreased right lower extremity sensation and the patient wished to  discontinue her epidural.  Dr.  Edwards, the anesthesiologist went ahead and  discontinued the epidural.  The patient was then changed over to PCA  morphine pump.  On postop day number two she complained of knee pain.  She  stated her epidurals had been discontinued because of the headache which  resolved without further problems.  She was using the CPM and the JAS  splint.  She continued on the PCA morphine pump and we talked with her  physical therapist about pushing range of motion as much as the patient  could tolerate.  She was to wear the dynamic splint at all times except when  on the CPM and when walking.  Her Foley catheter that was placed at time of  surgery was discontinued.  On postop day number three the patient was doing  well regarding her pain.  She was still taking a significant amount of IV  morphine.  Her knee range of motion at this time was zero degrees to 90  degrees.  She was discharged home.  Her IV was discontinued.  She was given  RX for OxyContin 40 mg b.i.d., Percocet 5 mg for breakthrough pain.  She  will start physical  therapy in one day on outpatient basis.  She will use  home CPM and JAS splint to hold her knee into full extension.   CONDITION ON DISCHARGE:  Improved.   DIET ON DISCHARGE:  Regular diet.   ACTIVITY:  Status ambulating with weight-bearing as tolerated on the left  with a walker.   FOLLOW UP:  We will see her in five days in the office.      [name], P.A.                       Harvie Junior, M.D.    Cordelia Pen  D:  10/31/2003  T:  11/01/2003  Job:  [id number]

## 2011-06-29 ENCOUNTER — Encounter (HOSPITAL_COMMUNITY): Payer: Self-pay | Admitting: Pharmacy Technician

## 2011-06-30 ENCOUNTER — Encounter (HOSPITAL_COMMUNITY)
Admission: RE | Admit: 2011-06-30 | Discharge: 2011-06-30 | Disposition: A | Discharge status: Home / Self Care | Payer: Worker's Compensation | Source: Ambulatory Visit | Attending: Orthopedic Surgery | Admitting: Orthopedic Surgery

## 2011-06-30 ENCOUNTER — Encounter (HOSPITAL_COMMUNITY): Payer: Self-pay

## 2011-06-30 HISTORY — DX: Acute upper respiratory infection, unspecified: J06.9

## 2011-06-30 HISTORY — DX: Anxiety disorder, unspecified: F41.9

## 2011-06-30 HISTORY — DX: Major depressive disorder, single episode, unspecified: F32.9

## 2011-06-30 HISTORY — DX: Nausea with vomiting, unspecified: Z98.890

## 2011-06-30 HISTORY — DX: Alopecia universalis: L63.1

## 2011-06-30 HISTORY — DX: Other specified postprocedural states: R11.2

## 2011-06-30 HISTORY — DX: Gastro-esophageal reflux disease without esophagitis: K21.9

## 2011-06-30 HISTORY — DX: Asymptomatic varicose veins of unspecified lower extremity: I83.90

## 2011-06-30 HISTORY — DX: Unspecified osteoarthritis, unspecified site: M19.90

## 2011-06-30 HISTORY — DX: Edema, unspecified: R60.9

## 2011-06-30 HISTORY — DX: Encounter for other specified aftercare: Z51.89

## 2011-06-30 HISTORY — DX: Depression, unspecified: F32.A

## 2011-06-30 HISTORY — DX: Urinary tract infection, site not specified: N39.0

## 2011-06-30 HISTORY — DX: Pure hypercholesterolemia, unspecified: E78.00

## 2011-06-30 HISTORY — DX: Headache: R51

## 2011-06-30 LAB — URINALYSIS, ROUTINE W REFLEX MICROSCOPIC
Bilirubin Urine: NEGATIVE
Hgb urine dipstick: NEGATIVE
Ketones, ur: NEGATIVE mg/dL
Nitrite: NEGATIVE
Specific Gravity, Urine: 1.013 (ref 1.005–1.030)
pH: 6.5 (ref 5.0–8.0)

## 2011-06-30 LAB — BASIC METABOLIC PANEL WITH GFR
BUN: 16 mg/dL (ref 6–23)
CO2: 31 meq/L (ref 19–32)
Calcium: 10.1 mg/dL (ref 8.4–10.5)
Chloride: 100 meq/L (ref 96–112)
Creatinine, Ser: 1.16 mg/dL — ABNORMAL HIGH (ref 0.50–1.10)
GFR calc Af Amer: 60 mL/min — ABNORMAL LOW
GFR calc non Af Amer: 52 mL/min — ABNORMAL LOW
Glucose, Bld: 83 mg/dL (ref 70–99)
Potassium: 3.6 meq/L (ref 3.5–5.1)
Sodium: 138 meq/L (ref 135–145)

## 2011-06-30 LAB — CBC
Platelets: 338 10*3/uL (ref 150–400)
RBC: 4.2 MIL/uL (ref 3.87–5.11)
RDW: 14 % (ref 11.5–15.5)
WBC: 5.6 10*3/uL (ref 4.0–10.5)

## 2011-06-30 LAB — SURGICAL PCR SCREEN
MRSA, PCR: NEGATIVE
Staphylococcus aureus: NEGATIVE

## 2011-06-30 LAB — PROTIME-INR
INR: 0.98 (ref 0.00–1.49)
Prothrombin Time: 13.2 seconds (ref 11.6–15.2)

## 2011-06-30 LAB — APTT: aPTT: 38 seconds — ABNORMAL HIGH (ref 24–37)

## 2011-06-30 LAB — URINE MICROSCOPIC-ADD ON

## 2011-06-30 LAB — DIFFERENTIAL
Basophils Absolute: 0 10*3/uL (ref 0.0–0.1)
Eosinophils Absolute: 0.2 10*3/uL (ref 0.0–0.7)
Eosinophils Relative: 4 % (ref 0–5)
Lymphocytes Relative: 41 % (ref 12–46)
Lymphs Abs: 2.3 10*3/uL (ref 0.7–4.0)
Neutrophils Relative %: 49 % (ref 43–77)

## 2011-06-30 LAB — ABO/RH: ABO/RH(D): A NEG

## 2011-06-30 NOTE — Patient Instructions (Signed)
20 Donna Marsh  06/30/2011   Your procedure is scheduled on:  Monday 12/10 AT 2:30 PM  Report to [facility] at 12:30 PM.  Call this number if you have problems the morning of surgery: [phone]   Remember:   Do not eat food:After Midnight.  May have clear liquids:FROM MIDNIGHT UNTIL 8:30 AM THE DAY OF YOUR SURGERY--NOTHING TO DRINK AFTER 8:30 AM  Clear liquids include soda, tea, black coffee, apple or grape juice.  Take these medicines the morning of surgery with A SIP OF WATER: VENLAFAXINE, ALPRAZOLAM AND TAKE TRAMADOL IF NEEDED FOR PAIN   Do not wear jewelry, make-up or nail polish.  Do not wear lotions, powders, or perfumes. You may wear deodorant.  Do not shave 48 hours prior to surgery.  Do not bring valuables to the hospital.  Contacts, dentures or bridgework may not be worn into surgery.  Leave suitcase in the car. After surgery it may be brought to your room.  For patients admitted to the hospital, checkout time is 11:00 AM the day of discharge.   Patients discharged the day of surgery will not be allowed to drive home.    Special Instructions: CHG Shower Use Special Wash: 1/2 bottle night before surgery and 1/2 bottle morning of surgery.   Please read over the following fact sheets that you were given: Blood Transfusion Information and MRSA Information AND INCENTIVE SPIROMETER INSTRUCTIONS

## 2011-07-04 ENCOUNTER — Encounter (HOSPITAL_COMMUNITY): Payer: Self-pay | Admitting: Anesthesiology

## 2011-07-04 ENCOUNTER — Inpatient Hospital Stay (HOSPITAL_COMMUNITY)
Admission: RE | Admit: 2011-07-04 | Discharge: 2011-07-06 | DRG: 489 | Disposition: A | Discharge status: Home Health | Payer: Worker's Compensation | Source: Ambulatory Visit | Attending: Orthopedic Surgery | Admitting: Orthopedic Surgery

## 2011-07-04 ENCOUNTER — Inpatient Hospital Stay (HOSPITAL_COMMUNITY): Payer: Worker's Compensation | Admitting: Anesthesiology

## 2011-07-04 ENCOUNTER — Encounter (HOSPITAL_COMMUNITY): Payer: Self-pay

## 2011-07-04 ENCOUNTER — Encounter (HOSPITAL_COMMUNITY)
Admission: RE | Disposition: A | Discharge status: Home Health | Payer: Self-pay | Source: Ambulatory Visit | Attending: Orthopedic Surgery

## 2011-07-04 DIAGNOSIS — T8489XA Other specified complication of internal orthopedic prosthetic devices, implants and grafts, initial encounter: Principal | ICD-10-CM | POA: Diagnosis present

## 2011-07-04 DIAGNOSIS — M23302 Other meniscus derangements, unspecified lateral meniscus, unspecified knee: Secondary | ICD-10-CM | POA: Diagnosis present

## 2011-07-04 DIAGNOSIS — Y831 Surgical operation with implant of artificial internal device as the cause of abnormal reaction of the patient, or of later complication, without mention of misadventure at the time of the procedure: Secondary | ICD-10-CM | POA: Diagnosis present

## 2011-07-04 DIAGNOSIS — Z01812 Encounter for preprocedural laboratory examination: Secondary | ICD-10-CM

## 2011-07-04 DIAGNOSIS — Z96659 Presence of unspecified artificial knee joint: Secondary | ICD-10-CM

## 2011-07-04 DIAGNOSIS — M25569 Pain in unspecified knee: Secondary | ICD-10-CM | POA: Diagnosis present

## 2011-07-04 HISTORY — PX: TOTAL KNEE REVISION: SHX996

## 2011-07-04 LAB — TYPE AND SCREEN: Antibody Screen: NEGATIVE

## 2011-07-04 SURGERY — TOTAL KNEE REVISION
Anesthesia: General | Site: Knee | Laterality: Right | Wound class: Clean

## 2011-07-04 MED ORDER — HYDROCODONE-ACETAMINOPHEN 7.5-325 MG PO TABS
1.0000 | ORAL_TABLET | ORAL | Status: DC
  Administered 2011-07-04 – 2011-07-06 (×11): 2 via ORAL
  Filled 2011-07-04 (×11): qty 2

## 2011-07-04 MED ORDER — ACETAMINOPHEN 10 MG/ML IV SOLN
INTRAVENOUS | Status: DC | PRN
  Administered 2011-07-04: 1000 mg via INTRAVENOUS

## 2011-07-04 MED ORDER — AMITRIPTYLINE HCL 25 MG PO TABS
25.0000 mg | ORAL_TABLET | Freq: Every day | ORAL | Status: DC
  Administered 2011-07-04 – 2011-07-05 (×2): 25 mg via ORAL
  Filled 2011-07-04 (×3): qty 1

## 2011-07-04 MED ORDER — DIPHENHYDRAMINE HCL 25 MG PO CAPS: 25.0000 mg | ORAL_CAPSULE | Freq: Four times a day (QID) | ORAL | Status: DC | PRN

## 2011-07-04 MED ORDER — KETOROLAC TROMETHAMINE 30 MG/ML IJ SOLN
INTRAMUSCULAR | Status: DC | PRN
  Administered 2011-07-04: 30 mg

## 2011-07-04 MED ORDER — METOCLOPRAMIDE HCL 10 MG PO TABS: 5.0000 mg | ORAL_TABLET | Freq: Three times a day (TID) | ORAL | Status: DC | PRN

## 2011-07-04 MED ORDER — LACTATED RINGERS IV SOLN
INTRAVENOUS | Status: DC
  Administered 2011-07-04: 1000 mL via INTRAVENOUS
  Administered 2011-07-04 (×2): via INTRAVENOUS

## 2011-07-04 MED ORDER — CHLORHEXIDINE GLUCONATE 4 % EX LIQD: 60.0000 mL | Freq: Once | CUTANEOUS | Status: DC

## 2011-07-04 MED ORDER — METOCLOPRAMIDE HCL 5 MG/ML IJ SOLN: 5.0000 mg | Freq: Three times a day (TID) | INTRAMUSCULAR | Status: DC | PRN

## 2011-07-04 MED ORDER — LIDOCAINE HCL (CARDIAC) 20 MG/ML IV SOLN
INTRAVENOUS | Status: DC | PRN
  Administered 2011-07-04: 100 mg via INTRAVENOUS

## 2011-07-04 MED ORDER — VENLAFAXINE HCL ER 75 MG PO CP24
75.0000 mg | ORAL_CAPSULE | Freq: Every day | ORAL | Status: DC
  Administered 2011-07-05 – 2011-07-06 (×2): 75 mg via ORAL
  Filled 2011-07-04 (×2): qty 1

## 2011-07-04 MED ORDER — METHOCARBAMOL 100 MG/ML IJ SOLN
500.0000 mg | Freq: Four times a day (QID) | INTRAMUSCULAR | Status: DC | PRN
  Filled 2011-07-04: qty 5

## 2011-07-04 MED ORDER — HYDROMORPHONE HCL PF 1 MG/ML IJ SOLN
INTRAMUSCULAR | Status: DC | PRN
  Administered 2011-07-04 (×2): 1 mg via INTRAVENOUS

## 2011-07-04 MED ORDER — FENTANYL CITRATE 0.05 MG/ML IJ SOLN
INTRAMUSCULAR | Status: DC | PRN
  Administered 2011-07-04: 100 ug via INTRAVENOUS
  Administered 2011-07-04: 50 ug via INTRAVENOUS
  Administered 2011-07-04: 100 ug via INTRAVENOUS

## 2011-07-04 MED ORDER — RIVAROXABAN 10 MG PO TABS
10.0000 mg | ORAL_TABLET | ORAL | Status: DC
  Administered 2011-07-05 – 2011-07-06 (×2): 10 mg via ORAL
  Filled 2011-07-04 (×2): qty 1

## 2011-07-04 MED ORDER — BUPIVACAINE-EPINEPHRINE 0.25% -1:200000 IJ SOLN
INTRAMUSCULAR | Status: AC
  Filled 2011-07-04: qty 1

## 2011-07-04 MED ORDER — HYDROMORPHONE HCL PF 1 MG/ML IJ SOLN
INTRAMUSCULAR | Status: AC
  Filled 2011-07-04: qty 1

## 2011-07-04 MED ORDER — BISACODYL 5 MG PO TBEC: 5.0000 mg | DELAYED_RELEASE_TABLET | Freq: Every day | ORAL | Status: DC | PRN

## 2011-07-04 MED ORDER — PHENOL 1.4 % MT LIQD: 1.0000 | OROMUCOSAL | Status: DC | PRN

## 2011-07-04 MED ORDER — ALPRAZOLAM 1 MG PO TABS: 1.0000 mg | ORAL_TABLET | Freq: Three times a day (TID) | ORAL | Status: DC | PRN

## 2011-07-04 MED ORDER — MENTHOL 3 MG MT LOZG: 1.0000 | LOZENGE | OROMUCOSAL | Status: DC | PRN

## 2011-07-04 MED ORDER — HYDROMORPHONE HCL PF 1 MG/ML IJ SOLN
INTRAMUSCULAR | Status: AC
  Administered 2011-07-04: 1 mg via INTRAVENOUS
  Filled 2011-07-04: qty 1

## 2011-07-04 MED ORDER — ONDANSETRON HCL 4 MG/2ML IJ SOLN
INTRAMUSCULAR | Status: DC | PRN
  Administered 2011-07-04: 4 mg via INTRAVENOUS

## 2011-07-04 MED ORDER — FERROUS SULFATE 325 (65 FE) MG PO TABS
325.0000 mg | ORAL_TABLET | Freq: Three times a day (TID) | ORAL | Status: DC
  Administered 2011-07-05 – 2011-07-06 (×5): 325 mg via ORAL
  Filled 2011-07-04 (×7): qty 1

## 2011-07-04 MED ORDER — TRIAMTERENE-HCTZ 37.5-25 MG PO TABS
1.0000 | ORAL_TABLET | Freq: Every day | ORAL | Status: DC
  Administered 2011-07-05 – 2011-07-06 (×2): 1 via ORAL
  Filled 2011-07-04 (×2): qty 1

## 2011-07-04 MED ORDER — ACETAMINOPHEN 650 MG RE SUPP: 650.0000 mg | Freq: Four times a day (QID) | RECTAL | Status: DC | PRN

## 2011-07-04 MED ORDER — METHOCARBAMOL 500 MG PO TABS
500.0000 mg | ORAL_TABLET | Freq: Four times a day (QID) | ORAL | Status: DC | PRN
  Administered 2011-07-05 – 2011-07-06 (×5): 500 mg via ORAL
  Filled 2011-07-04 (×5): qty 1

## 2011-07-04 MED ORDER — ACETAMINOPHEN 325 MG PO TABS: 650.0000 mg | ORAL_TABLET | Freq: Four times a day (QID) | ORAL | Status: DC | PRN

## 2011-07-04 MED ORDER — SODIUM CHLORIDE 0.9 % IV SOLN
INTRAVENOUS | Status: DC
  Administered 2011-07-04: 22:00:00 via INTRAVENOUS
  Filled 2011-07-04 (×7): qty 1000

## 2011-07-04 MED ORDER — ALUM & MAG HYDROXIDE-SIMETH 200-200-20 MG/5ML PO SUSP: 30.0000 mL | ORAL | Status: DC | PRN

## 2011-07-04 MED ORDER — TRANEXAMIC ACID 100 MG/ML IV SOLN
15.0000 mg/kg | Freq: Once | INTRAVENOUS | Status: AC
  Administered 2011-07-04: 1069.5 mg via INTRAVENOUS
  Filled 2011-07-04: qty 10.7

## 2011-07-04 MED ORDER — FENTANYL CITRATE 0.05 MG/ML IJ SOLN
INTRAMUSCULAR | Status: AC
  Filled 2011-07-04: qty 2

## 2011-07-04 MED ORDER — ONDANSETRON HCL 4 MG/2ML IJ SOLN: 4.0000 mg | Freq: Four times a day (QID) | INTRAMUSCULAR | Status: DC | PRN

## 2011-07-04 MED ORDER — MIDAZOLAM HCL 5 MG/5ML IJ SOLN
INTRAMUSCULAR | Status: DC | PRN
  Administered 2011-07-04: 2 mg via INTRAVENOUS

## 2011-07-04 MED ORDER — POLYETHYLENE GLYCOL 3350 17 G PO PACK
17.0000 g | PACK | Freq: Two times a day (BID) | ORAL | Status: DC
  Administered 2011-07-04 – 2011-07-05 (×3): 17 g via ORAL
  Filled 2011-07-04 (×5): qty 1

## 2011-07-04 MED ORDER — PROMETHAZINE HCL 25 MG/ML IJ SOLN: 6.2500 mg | INTRAMUSCULAR | Status: DC | PRN

## 2011-07-04 MED ORDER — MIDAZOLAM HCL 2 MG/2ML IJ SOLN
1.0000 mg | INTRAMUSCULAR | Status: DC | PRN
  Administered 2011-07-04: 2 mg via INTRAVENOUS

## 2011-07-04 MED ORDER — SODIUM CHLORIDE 0.9 % IR SOLN
Status: DC | PRN
  Administered 2011-07-04: 1000 mL

## 2011-07-04 MED ORDER — SUCCINYLCHOLINE CHLORIDE 20 MG/ML IJ SOLN
INTRAMUSCULAR | Status: DC | PRN
  Administered 2011-07-04: 100 mg via INTRAVENOUS

## 2011-07-04 MED ORDER — DEXAMETHASONE SODIUM PHOSPHATE 10 MG/ML IJ SOLN
INTRAMUSCULAR | Status: DC | PRN
  Administered 2011-07-04: 10 mg via INTRAVENOUS

## 2011-07-04 MED ORDER — FLEET ENEMA 7-19 GM/118ML RE ENEM: 1.0000 | ENEMA | Freq: Once | RECTAL | Status: AC | PRN

## 2011-07-04 MED ORDER — DEXAMETHASONE SODIUM PHOSPHATE 10 MG/ML IJ SOLN
10.0000 mg | Freq: Once | INTRAMUSCULAR | Status: AC
  Administered 2011-07-05: 10 mg via INTRAVENOUS
  Filled 2011-07-04: qty 1

## 2011-07-04 MED ORDER — KETOROLAC TROMETHAMINE 30 MG/ML IJ SOLN
INTRAMUSCULAR | Status: AC
  Filled 2011-07-04: qty 1

## 2011-07-04 MED ORDER — CEFAZOLIN SODIUM 1-5 GM-% IV SOLN
1.0000 g | INTRAVENOUS | Status: AC
  Administered 2011-07-04: 1 g via INTRAVENOUS

## 2011-07-04 MED ORDER — ZOLPIDEM TARTRATE 5 MG PO TABS: 5.0000 mg | ORAL_TABLET | Freq: Every evening | ORAL | Status: DC | PRN

## 2011-07-04 MED ORDER — DOCUSATE SODIUM 100 MG PO CAPS
100.0000 mg | ORAL_CAPSULE | Freq: Two times a day (BID) | ORAL | Status: DC
  Administered 2011-07-04 – 2011-07-06 (×4): 100 mg via ORAL
  Filled 2011-07-04 (×5): qty 1

## 2011-07-04 MED ORDER — ROPIVACAINE HCL 5 MG/ML IJ SOLN
INTRAMUSCULAR | Status: AC
  Filled 2011-07-04: qty 30

## 2011-07-04 MED ORDER — CEFAZOLIN SODIUM 1-5 GM-% IV SOLN
1.0000 g | Freq: Four times a day (QID) | INTRAVENOUS | Status: AC
  Administered 2011-07-04 – 2011-07-05 (×3): 1 g via INTRAVENOUS
  Filled 2011-07-04 (×3): qty 50

## 2011-07-04 MED ORDER — HYDROMORPHONE HCL PF 1 MG/ML IJ SOLN
0.5000 mg | INTRAMUSCULAR | Status: DC | PRN
  Administered 2011-07-04 – 2011-07-05 (×3): 1 mg via INTRAVENOUS
  Filled 2011-07-04 (×2): qty 1

## 2011-07-04 MED ORDER — HYDROMORPHONE HCL PF 1 MG/ML IJ SOLN
0.2500 mg | INTRAMUSCULAR | Status: DC | PRN
  Administered 2011-07-04 (×2): 0.5 mg via INTRAVENOUS

## 2011-07-04 MED ORDER — ROPIVACAINE HCL 5 MG/ML IJ SOLN
INTRAMUSCULAR | Status: DC | PRN
  Administered 2011-07-04: 25 mL via EPIDURAL

## 2011-07-04 MED ORDER — ONDANSETRON HCL 4 MG PO TABS: 4.0000 mg | ORAL_TABLET | Freq: Four times a day (QID) | ORAL | Status: DC | PRN

## 2011-07-04 MED ORDER — MIDAZOLAM HCL 2 MG/2ML IJ SOLN
INTRAMUSCULAR | Status: AC
  Filled 2011-07-04: qty 2

## 2011-07-04 MED ORDER — BUPIVACAINE-EPINEPHRINE PF 0.25-1:200000 % IJ SOLN
INTRAMUSCULAR | Status: DC | PRN
  Administered 2011-07-04: 50 mL

## 2011-07-04 MED ORDER — PROPOFOL 10 MG/ML IV EMUL
INTRAVENOUS | Status: DC | PRN
  Administered 2011-07-04: 200 mg via INTRAVENOUS

## 2011-07-04 SURGICAL SUPPLY — 62 items
BAG ZIPLOCK 12X15 (MISCELLANEOUS) ×2 IMPLANT
BANDAGE ELASTIC 6 VELCRO ST LF (GAUZE/BANDAGES/DRESSINGS) ×2 IMPLANT
BANDAGE ESMARK 6X9 LF (GAUZE/BANDAGES/DRESSINGS) ×1 IMPLANT
BLADE SAW SGTL 13.0X1.19X90.0M (BLADE) ×2 IMPLANT
BLADE SAW SGTL 81X20 HD (BLADE) ×2 IMPLANT
BNDG ESMARK 6X9 LF (GAUZE/BANDAGES/DRESSINGS) ×2
BOWL SMART MIX CTS (DISPOSABLE) IMPLANT
CEMENT HV SMART SET (Cement) ×2 IMPLANT
CLOTH BEACON ORANGE TIMEOUT ST (SAFETY) ×2 IMPLANT
CUFF TOURN SGL QUICK 34 (TOURNIQUET CUFF) ×1
CUFF TRNQT CYL 34X4X40X1 (TOURNIQUET CUFF) ×1 IMPLANT
DERMABOND ADVANCED (GAUZE/BANDAGES/DRESSINGS) ×1
DERMABOND ADVANCED .7 DNX12 (GAUZE/BANDAGES/DRESSINGS) ×1 IMPLANT
DRAPE EXTREMITY T 121X128X90 (DRAPE) ×2 IMPLANT
DRAPE POUCH INSTRU U-SHP 10X18 (DRAPES) ×2 IMPLANT
DRAPE U-SHAPE 47X51 STRL (DRAPES) ×2 IMPLANT
DRSG ADAPTIC 3X8 NADH LF (GAUZE/BANDAGES/DRESSINGS) ×2 IMPLANT
DRSG AQUACEL AG ADV 3.5X10 (GAUZE/BANDAGES/DRESSINGS) ×2 IMPLANT
DRSG PAD ABDOMINAL 8X10 ST (GAUZE/BANDAGES/DRESSINGS) ×2 IMPLANT
DRSG TEGADERM 4X4.75 (GAUZE/BANDAGES/DRESSINGS) ×2 IMPLANT
DURAPREP 26ML APPLICATOR (WOUND CARE) ×2 IMPLANT
ELECT REM PT RETURN 9FT ADLT (ELECTROSURGICAL) ×2
ELECTRODE REM PT RTRN 9FT ADLT (ELECTROSURGICAL) ×1 IMPLANT
EVACUATOR 1/8 PVC DRAIN (DRAIN) ×2 IMPLANT
FACESHIELD LNG OPTICON STERILE (SAFETY) ×10 IMPLANT
GAUZE SPONGE 2X2 8PLY STRL LF (GAUZE/BANDAGES/DRESSINGS) ×1 IMPLANT
GLOVE BIOGEL PI IND STRL 7.5 (GLOVE) ×1 IMPLANT
GLOVE BIOGEL PI IND STRL 8 (GLOVE) ×2 IMPLANT
GLOVE BIOGEL PI INDICATOR 7.5 (GLOVE) ×1
GLOVE BIOGEL PI INDICATOR 8 (GLOVE) ×2
GLOVE ORTHO TXT STRL SZ7.5 (GLOVE) ×4 IMPLANT
GLOVE SURG ORTHO 8.0 STRL STRW (GLOVE) ×2 IMPLANT
GOWN STRL NON-REIN LRG LVL3 (GOWN DISPOSABLE) ×2 IMPLANT
HANDPIECE INTERPULSE COAX TIP (DISPOSABLE) ×1
IMMOBILIZER KNEE 20 (SOFTGOODS)
IMMOBILIZER KNEE 20 THIGH 36 (SOFTGOODS) IMPLANT
INSERT PFC SIG STB SZ 2 12.5MM (Knees) ×2 IMPLANT
KIT BASIN OR (CUSTOM PROCEDURE TRAY) ×2 IMPLANT
MANIFOLD NEPTUNE II (INSTRUMENTS) ×2 IMPLANT
NDL SAFETY ECLIPSE 18X1.5 (NEEDLE) ×1 IMPLANT
NEEDLE HYPO 18GX1.5 SHARP (NEEDLE) ×1
NS IRRIG 1000ML POUR BTL (IV SOLUTION) ×2 IMPLANT
PACK TOTAL JOINT (CUSTOM PROCEDURE TRAY) ×2 IMPLANT
PADDING CAST COTTON 6X4 STRL (CAST SUPPLIES) ×4 IMPLANT
PATELLA DOME PFC 38MM (Knees) ×2 IMPLANT
POSITIONER SURGICAL ARM (MISCELLANEOUS) ×2 IMPLANT
SET HNDPC FAN SPRY TIP SCT (DISPOSABLE) ×1 IMPLANT
SET PAD KNEE POSITIONER (MISCELLANEOUS) ×2 IMPLANT
SPONGE GAUZE 2X2 STER 10/PKG (GAUZE/BANDAGES/DRESSINGS) ×1
SPONGE GAUZE 4X4 12PLY (GAUZE/BANDAGES/DRESSINGS) ×4 IMPLANT
SPONGE LAP 18X18 X RAY DECT (DISPOSABLE) ×2 IMPLANT
STAPLER VISISTAT 35W (STAPLE) IMPLANT
SUCTION FRAZIER 12FR DISP (SUCTIONS) ×2 IMPLANT
SUT VIC AB 1 CT1 36 (SUTURE) ×6 IMPLANT
SUT VIC AB 2-0 CT1 27 (SUTURE) ×3
SUT VIC AB 2-0 CT1 TAPERPNT 27 (SUTURE) ×3 IMPLANT
SYR 50ML LL SCALE MARK (SYRINGE) ×2 IMPLANT
TOWEL OR 17X26 10 PK STRL BLUE (TOWEL DISPOSABLE) ×4 IMPLANT
TOWER CARTRIDGE SMART MIX (DISPOSABLE) ×2 IMPLANT
TRAY FOLEY CATH 14FRSI W/METER (CATHETERS) ×2 IMPLANT
WATER STERILE IRR 1500ML POUR (IV SOLUTION) ×2 IMPLANT
WRAP KNEE MAXI GEL POST OP (GAUZE/BANDAGES/DRESSINGS) ×2 IMPLANT

## 2011-07-04 NOTE — Anesthesia Postprocedure Evaluation (Signed)
  Anesthesia Post-op Note  Patient: Donna Marsh  Procedure(s) Performed:  TOTAL KNEE REVISION - Right Total Knee Revision with Scar Debridement/Patella Revision with Poly Exchange  Patient Location: PACU  Anesthesia Type: GA combined with regional for post-op pain  Level of Consciousness: oriented and sedated  Airway and Oxygen Therapy: Patient Spontanous Breathing and Patient connected to nasal cannula oxygen  Post-op Pain: mild  Post-op Assessment: Post-op Vital signs reviewed, Patient's Cardiovascular Status Stable, Respiratory Function Stable and Patent Airway  Post-op Vital Signs: stable  Complications: No apparent anesthesia complications

## 2011-07-04 NOTE — Interval H&P Note (Signed)
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 After consideration of risks, benefits and other options for treatment, the patient has consented to  Procedure(s): RIGHT TOTAL KNEE REVISION and scar debridement as a surgical intervention .  The patients' history has been reviewed, patient examined, no change in status, stable for surgery.  I have reviewed the patients' chart and labs.  Questions were answered to the patient's satisfaction.     Shelda Pal

## 2011-07-04 NOTE — Op Note (Signed)
[name], Marsh           ACCOUNT NO.:  [id number]  MEDICAL RECORD NO.:  [id number]  LOCATION:  [facility]                         FACILITY:  [facility]  PHYSICIAN:  Madlyn Frankel. Charlann Boxer, M.D.  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  PROCEDURE:  Right knee revision surgery with polyethylene exchange, changing from a size 2 x 10 mm insert to a 2 x 12.5 mm insert and revising the patella from the 32 to a 38 button with a recutting of the patella and these were DePuy components with a rotating platform system.  SURGEON:  Madlyn Frankel. Charlann Boxer, MD  ASSISTANT:  Lanney Gins, PA  ANESTHESIA:  Preoperative regional femoral nerve block plus general anesthetics.  SPECIMENS:  None.  COMPLICATION:  None.  DRAINS:  One Hemovac.  TOURNIQUET TIME:  25 minutes at 250 mmHg.  INDICATION FOR PROCEDURE:  Donna Marsh is a 55 year old female who presented to the office for opinion evaluation of her right total knee. She had mechanical popping and catching in her knee following the knee replacement just done 11 months prior.  Radiographically including a sunrise view, she was noted to have a significantly oblique patella, which tracked to the trochlea with a significantly thin lateral segment.  After reviewing with her, the persistence of her symptoms despite an excellent range of motion, she wished at this point to proceed with intervention.  We discussed with her the open scar debridement as well as the revision of the patella just to kind of better align it based on radiographic evaluation.  After reviewing the risks of persistent pain, recurrence of mechanical catching due to  __________, infection, DVT, component failure, need for future revision surgery, consent was obtained for benefit of pain relief.  PROCEDURE IN DETAIL:  The patient was brought to the operative theater. Once adequate anesthesia, preoperative antibiotics, Ancef administered, she was positioned supine with the right thigh tourniquet placed.  The right lower extremity was then prepped and draped in a sterile fashion with the right foot placed in DeMayo leg holder.  Time-out was performed identifying the patient, planned procedure, and extremity.  Leg was exsanguinated, tourniquet elevated to 250 mmHg.  The previous incision was then identified and utilized for the procedure.  Soft tissue planes created and median arthrotomy was made encountering a small amount of clear synovial fluid.  There was an abundant scar in the peripatellar region.  Initial portion of this case was performing a synovectomy along the medial gutter onto the suprapatellar pouch and then laterally along the quadriceps tendon and patella tendon and debriding the site to a stable level, removing a significant amount of scar in this region.  I evaluated the patella at this point with use of a caliper.  The thickness of the patella was about 21 mm with a 32 mm button.  However radiographically, it was easy to identify the thin segment laterally.  At this point, I removed the old  polyethylene insert, was noted to be a size 10 insert.  Based on examination under anesthesia, there is a little bit of hyperextension, so I bumped up to a size 12.5 mm.  A 12.5 trial insert was placed and the knee came to full extension.  At this point, I returned attention to the patella and using an ACL thin saw blade, I removed the patella.  I did this just underneath the polyethylene.  I then revised the cut removing some of the medial bones with the final resection down to about 11 or 12 mm.  There was retained cement on this  lateral side, which I chose to keep it intact and provide more of a support for the new patella.  I chose to upsize it to a 38 patellar button to restore some of the patellar height, but also to get better coverage for any potential contact through the trochlea.  Lug holes were drilled and lug trial was placed. The patella was noted to track through the trochlea without evidence of any tilting as it was preoperatively.  At this point, the trial components were removed.  The final components were opened and a single batch of nonantibiotic cement was prepared.  Once the cement was ready, the patella was cemented into position and held with a clamp.  Extruded cement was removed and the final 12.5 insert to match the 2 femur was inserted.  The tourniquet had been let down after 25 minutes.  There was no significant hemostasis required. The knee was irrigated with normal saline solution, pulse lavage.  The synovial capsule junction of the knee was injected with 0.25% Marcaine with epinephrine and 1 cc of Toradol, total of 61 cc.  Once the cement had fully cured, I confirmed there were no remaining disk fragments or any other debris in the knee, re-irrigated it with normal saline solution, placed a medium Hemovac drain deep.  The extensor mechanism was then reapproximated with #1 Vicryl with the knee in flexion.  The remaining wound was closed with 2-0 Vicryl and running 4-0 Monocryl.  The knee was cleaned, dried, and dressed sterilely using Dermabond and Aquacel dressing.  The patient was brought into the recovery room in stable condition with the drain site dressed separately and the knee wrapped in an Ace wrap.     Madlyn Frankel Charlann Boxer, M.D.     MDO/MEDQ  D:  07/04/2011  T:  07/04/2011  Job:  [id number]

## 2011-07-04 NOTE — Anesthesia Preprocedure Evaluation (Addendum)
Anesthesia Evaluation  Patient identified by MRN, date of birth, ID band Patient awake    Reviewed: Allergy & Precautions, H&P , NPO status , Patient's Chart, lab work & pertinent test results, reviewed documented beta blocker date and time   Airway Mallampati: II TM Distance: >3 FB     Dental  (+) Dental Advisory Given   Pulmonary neg pulmonary ROS,  clear to auscultation        Cardiovascular neg cardio ROS Regular Normal Denies cardiac symptoms   Neuro/Psych Negative Neurological ROS  Negative Psych ROS   GI/Hepatic negative GI ROS, Neg liver ROS,   Endo/Other  Negative Endocrine ROS  Renal/GU negative Renal ROS  Genitourinary negative   Musculoskeletal negative musculoskeletal ROS (+)   Abdominal   Peds negative pediatric ROS (+)  Hematology negative hematology ROS (+)   Anesthesia Other Findings Front cap   Reproductive/Obstetrics negative OB ROS                          Anesthesia Physical Anesthesia Plan  ASA: I  Anesthesia Plan: General   Post-op Pain Management:    Induction: Intravenous  Airway Management Planned: Oral ETT  Additional Equipment:   Intra-op Plan:   Post-operative Plan: Extubation in OR  Informed Consent: I have reviewed the patients History and Physical, chart, labs and discussed the procedure including the risks, benefits and alternatives for the proposed anesthesia with the patient or authorized representative who has indicated his/her understanding and acceptance.   Dental advisory given  Plan Discussed with: CRNA and Surgeon  Anesthesia Plan Comments:        Anesthesia Quick Evaluation

## 2011-07-04 NOTE — Plan of Care (Signed)
Problem: Consults Goal: Diagnosis- Total Joint Replacement Outcome: Completed/Met Date Met:  07/04/11 Rev rt total knee

## 2011-07-04 NOTE — Anesthesia Procedure Notes (Signed)
Anesthesia Regional Block:  Femoral nerve block  Pre-Anesthetic Checklist: ,, timeout performed, Correct Patient, Correct Site, Correct Laterality, Correct Procedure, Correct Position, site marked, Risks and benefits discussed, Surgical consent, Pre-op evaluation  Laterality: Right  Prep: chloraprep       Needles:  Injection technique: Single-shot  Needle Type: Echogenic Needle     Needle Length: 10cm 10 cm Needle Gauge: 21 G  Needle insertion depth: 5 cm   Additional Needles:  Procedures: ultrasound guided Femoral nerve block Narrative:  Start time: 07/04/2011 12:20 PM End time: 07/04/2011 12:30 PM  Performed by: Personally

## 2011-07-04 NOTE — H&P (Signed)
Donna Marsh is an 55 y.o. female.    Chief Complaint: right knee pain, S/P previous right TKA   HPI: Pt is a 55 y.o. female complaining of right knee pain for about 1 year. Pain had continually increased since the beginning. She had a right TKA in January of 2012, and has had pain since that time. She also complains of her knee cap popping in and out. X-rays in the clinic show Previous TKA. Pt has tried various conservative treatments which have failed to alleviate their symptoms. Various options are discussed with the patient. Risks, benefits and expectations were discussed with the patient. Patient understand the risks, benefits and expectations and wishes to proceed with surgery.   PCP:  Clayborn Bigness, MD  D/C Plans: Home with HHPT  Post-op Meds: Rx given for ASA, Robaxin, Celebrex, Iron, Colace and MiraLax  Tranexamic Acid: To be given  PMH: Past Medical History  Diagnosis Date  . Headache     HX OF MIGRAINES  . Anxiety   . Depression   . Hypercholesterolemia   . Varicose veins     LEFT LEG  . UTI (lower urinary tract infection)     FREQUENTLY--JUST FINISHED ANTIBIOTIC SAT 06/25/11 FOR UTI  . URI (upper respiratory infection)     URI IN OCT 2012--PT FEELS CLEAR NOW--DOES NOT USUALLY HAVE ANY PROBLEMS WITH URI  . Arthritis     RA AND OA .    Marland Kitchen Edema     HX OF SWELLING OF FEET, LEGS, HANDS AND FACE--TAKES TRIAMTERENE/HCTZ FOR PREVENTION--DOES NOT HAVE HYPERTENSION AND HAS BEEN TOLD B/P USUALLY LOW  . Alopecia universalis   . Blood transfusion 1979    WITH PREGNANCY  . GERD (gastroesophageal reflux disease)     OCCAS--OTC MED IF ANYTHING NEEDED  . PONV (postoperative nausea and vomiting)     PSH: Past Surgical History  Procedure Date  . Cholecystectomy 2011  . Joint replacement JAN 2006    LEFT TOTAL KNEE ARTHROPLASTY  A  . Joint replacement JAN 2012    RIGHT TOTAL KNEE ARTHROPLASTY  . Abdominal hysterectomy     Social History:  reports that she has  never smoked. She has never used smokeless tobacco. She reports that she drinks alcohol. She reports that she does not use illicit drugs.  Allergies:  No Known Allergies  Medications: No current facility-administered medications on file as of .   No current outpatient prescriptions on file as of .    ROS: Review of Systems  Constitutional: Negative.   HENT: Negative.   Eyes: Negative.   Respiratory: Negative.   Cardiovascular: Negative.   Gastrointestinal: Negative.   Genitourinary: Negative.   Musculoskeletal: Positive for joint pain (right knee).  Skin: Negative.   Neurological: Negative.   Endo/Heme/Allergies: Negative.   Psychiatric/Behavioral: Negative.      Physican Exam: Physical Exam  Constitutional: She is oriented to person, place, and time and well-developed, well-nourished, and in no distress.  HENT:  Head: Normocephalic and atraumatic.  Eyes: Pupils are equal, round, and reactive to light.  Neck: Neck supple. No JVD present. No tracheal deviation present. No thyromegaly present.  Cardiovascular: Normal rate and regular rhythm.  Exam reveals no gallop and no friction rub.   No murmur heard. Pulmonary/Chest: Effort normal and breath sounds normal. No stridor.  Abdominal: Soft. There is no tenderness. There is no guarding.  Musculoskeletal:       Right knee: She exhibits decreased range of motion and swelling. She exhibits no  effusion and no ecchymosis. tenderness found.  Lymphadenopathy:    She has no cervical adenopathy.  Neurological: She is alert and oriented to person, place, and time.  Skin: Skin is warm and dry. No rash noted. No erythema. No pallor.  Psychiatric: Affect normal.      Assessment/Plan Assessment: right knee pain, S/P previous right TKA  Plan: Patient will undergo a right total knee revision on 07/04/2011. Risks benefits and expectation were discussed with the patient. Patient understand risks, benefits and expectation and wishes to  proceed.   Anastasio Auerbach Leanord Thibeau   PAC  07/04/2011, 7:56 AM

## 2011-07-04 NOTE — Brief Op Note (Signed)
07/04/2011  2:19 PM  PATIENT:  Donna Marsh  55 y.o. female  PRE-OPERATIVE DIAGNOSIS:  Status Post Right Total Knee with Excessive Scar and Patella Clunk  POST-OPERATIVE DIAGNOSIS:  Status post right total knee with excessive scar tissue and patella clunk and patella malignment  PROCEDURE:  Procedure(s): TOTAL KNEE REVISION  SURGEON:  Surgeon(s): Shelda Pal  PHYSICIAN ASSISTANT: Lanney Gins, PA-C  ANESTHESIA:   regional and general  EBL:  Total I/O In: 1000 [I.V.:1000] Out: 200 [Urine:150; Blood:50]  BLOOD ADMINISTERED:none  DRAINS: (1 medium) Hemovact drain(s) in the right knee with  Suction Open   LOCAL MEDICATIONS USED:  BUPIVICAINE 60CC  SPECIMEN:  No Specimen  DISPOSITION OF SPECIMEN:  N/A  COUNTS:  YES  TOURNIQUET:   Total Tourniquet Time Documented: Thigh (Right) - 26 minutes  DICTATION: .Other Dictation: Dictation Number [id number]  PLAN OF CARE: Admit to inpatient   PATIENT DISPOSITION:  PACU - hemodynamically stable.   Delay start of Pharmacological VTE agent (>24hrs) due to surgical blood loss or risk of bleeding:  {YES/NO/NOT APPLICABLE:20182

## 2011-07-04 NOTE — Preoperative (Signed)
Beta Blockers   Reason not to administer Beta Blockers:Not Applicable 

## 2011-07-04 NOTE — Transfer of Care (Signed)
Immediate Anesthesia Transfer of Care Note  Patient: Donna Marsh  Procedure(s) Performed:  TOTAL KNEE REVISION - Right Total Knee Revision with Scar Debridement/Patella Revision with Poly Exchange  Patient Location: PACU  Anesthesia Type: GA combined with regional for post-op pain  Level of Consciousness: awake, alert  and oriented  Airway & Oxygen Therapy: Patient Spontanous Breathing and Patient connected to face mask oxygen  Post-op Assessment: Report given to PACU RN and Post -op Vital signs reviewed and stable  Post vital signs: Reviewed and stable  Complications: No apparent anesthesia complications

## 2011-07-05 LAB — CBC
HCT: 31.2 % — ABNORMAL LOW (ref 36.0–46.0)
Hemoglobin: 10.4 g/dL — ABNORMAL LOW (ref 12.0–15.0)
MCV: 90.7 fL (ref 78.0–100.0)
Platelets: 296 10*3/uL (ref 150–400)
RBC: 3.44 MIL/uL — ABNORMAL LOW (ref 3.87–5.11)
WBC: 10.3 10*3/uL (ref 4.0–10.5)

## 2011-07-05 LAB — BASIC METABOLIC PANEL
BUN: 16 mg/dL (ref 6–23)
CO2: 28 mEq/L (ref 19–32)
Chloride: 103 mEq/L (ref 96–112)
Creatinine, Ser: 0.99 mg/dL (ref 0.50–1.10)
Glucose, Bld: 147 mg/dL — ABNORMAL HIGH (ref 70–99)

## 2011-07-05 MED ORDER — METHOCARBAMOL 500 MG PO TABS: 500.0000 mg | ORAL_TABLET | Freq: Four times a day (QID) | ORAL | Status: AC | PRN

## 2011-07-05 MED ORDER — FERROUS SULFATE 325 (65 FE) MG PO TABS: 325.0000 mg | ORAL_TABLET | Freq: Three times a day (TID) | ORAL | Status: DC

## 2011-07-05 MED ORDER — POLYETHYLENE GLYCOL 3350 17 G PO PACK: 17.0000 g | PACK | Freq: Two times a day (BID) | ORAL | Status: AC

## 2011-07-05 MED ORDER — ASPIRIN EC 325 MG PO TBEC: 325.0000 mg | DELAYED_RELEASE_TABLET | Freq: Two times a day (BID) | ORAL | Status: AC

## 2011-07-05 MED ORDER — DIPHENHYDRAMINE HCL 25 MG PO CAPS: 25.0000 mg | ORAL_CAPSULE | Freq: Four times a day (QID) | ORAL | Status: AC | PRN

## 2011-07-05 MED ORDER — DSS 100 MG PO CAPS: 100.0000 mg | ORAL_CAPSULE | Freq: Two times a day (BID) | ORAL | Status: AC

## 2011-07-05 NOTE — Discharge Summary (Signed)
Physician Discharge Summary  Patient ID: Donna Marsh MRN: [id number] DOB/AGE: 55/12/1955 55 y.o.  Admit date: 07/04/2011 Discharge date: 07/06/2011  Procedures:  Procedure(s) (LRB): TOTAL KNEE REVISION (Right)  Attending Physician: Shelda Pal   Admission Diagnoses: Right knee pain, S/P previous right TKA     Discharge Diagnoses:  Active Problems:  S/P revision of right total knee Headache  Anxiety   Depression   Hypercholesterolemia  Alopecia universalis   GERD  HPI: Pt is a 55 y.o. female complaining of right knee pain for about 1 year. Pain had continually increased since the beginning. She had a right TKA in January of 2012, and has had pain since that time. She also complains of her knee cap popping in and out. X-rays in the clinic show Previous TKA. Pt has tried various conservative treatments which have failed to alleviate their symptoms. Various options are discussed with the patient. Risks, benefits and expectations were discussed with the patient. Patient understand the risks, benefits and expectations and wishes to proceed with surgery.   PCP: Clayborn Bigness, MD   Discharged Condition: good  Hospital Course:  Patient underwent the above stated procedure on 07/04/2011. Patient tolerated the procedure well and brought to the recovery room in good condition and subsequently to the floor.  POD #1 BP: 112/69 ; Pulse: 68 ; Temp: 98.2 F Pt's foley was removed, as well as the hemovac drain removed. IV was changed to a saline lock.Patient reports pain as mild. No events. Neurovascular intact, dorsiflexion/plantar flexion intact, incision: dressing C/D/I, no cellulitis present and compartment soft.  LABS  Basename  07/05/11 0400   HGB  10.4*   HCT  31.2*    POD #2  BP: 147/83 ; Pulse: 73 ; Temp: 98.1 F Patient reports pain as mild. No events. Feels ready to go home. Neurovascular intact, dorsiflexion/plantar flexion intact, incision: dressing C/D/I, no  cellulitis present and compartment soft.  LABS  Basename  07/06/11 0405    HGB  10.6*    HCT  32.3*     Discharge Exam: Extremities: Homans sign is negative, no sign of DVT, no edema, redness or tenderness in the calves or thighs and no ulcers, gangrene or trophic changes  Disposition: Home with Home Health PT  Discharge Orders    Future Orders Please Complete By Expires   Diet - low sodium heart healthy      Call MD / Call 911      Comments:   If you experience chest pain or shortness of breath, CALL 911 and be transported to the hospital emergency room.  If you develope a fever above 101 F, pus (white drainage) or increased drainage or redness at the wound, or calf pain, call your surgeon's office.   Discharge instructions      Comments:   Maintain surgical dressing for 8 days, then replace with gauze and tape. Keep the area dry and clean until follow up. Follow up in 2 weeks at [facility]. Call with any questions or concerns.     Constipation Prevention      Comments:   Drink plenty of fluids.  Prune juice may be helpful.  You may use a stool softener, such as Colace (over the counter) 100 mg twice a day.  Use MiraLax (over the counter) for constipation as needed.   Increase activity slowly as tolerated      Weight Bearing as taught in Physical Therapy      Comments:   Use a walker  or crutches as instructed.   Driving restrictions      Comments:   No driving for 4 weeks   TED hose      Comments:   Use stockings (TED hose) for 2 weeks on both leg(s).  You may remove them at night for sleeping.   Change dressing      Comments:   Maintain surgical dressing for 8 days, then change the dressing daily with sterile 4 x 4 inch gauze dressing and tape. Keep area dry and clean.      Current Discharge Medication List    START taking these medications   Details  aspirin EC 325 MG tablet Take 1 tablet (325 mg total) by mouth 2 (two) times daily. X 4 weeks Qty: 60  tablet, Refills: 0    diphenhydrAMINE (BENADRYL) 25 mg capsule Take 1 capsule (25 mg total) by mouth every 6 (six) hours as needed for itching, allergies or sleep.    docusate sodium 100 MG CAPS Take 100 mg by mouth 2 (two) times daily.    ferrous sulfate 325 (65 FE) MG tablet Take 1 tablet (325 mg total) by mouth 3 (three) times daily after meals.    HYDROcodone-acetaminophen (NORCO) 7.5-325 MG per tablet Take 1-2 tablets by mouth every 4 (four) hours. Qty: 120 tablet, Refills: 0    methocarbamol (ROBAXIN) 500 MG tablet Take 1 tablet (500 mg total) by mouth every 6 (six) hours as needed (muscle spasms).    polyethylene glycol (MIRALAX / GLYCOLAX) packet Take 17 g by mouth 2 (two) times daily. Qty: 14 each      CONTINUE these medications which have NOT CHANGED   Details  ALPRAZolam (XANAX) 1 MG tablet Take 1 mg by mouth 3 (three) times daily as needed. ANXIETY      amitriptyline (ELAVIL) 25 MG tablet Take 25 mg by mouth at bedtime.      triamterene-hydrochlorothiazide (MAXZIDE-25) 37.5-25 MG per tablet Take 1 tablet by mouth every morning.     venlafaxine (EFFEXOR-XR) 75 MG 24 hr capsule Take 75 mg by mouth every morning.        STOP taking these medications     traMADol (ULTRAM) 50 MG tablet Comments:  Reason for Stopping:          Signed: Anastasio Auerbach. Werner Labella   PAC  07/06/2011, 9:22 AM

## 2011-07-05 NOTE — Progress Notes (Signed)
Physical Therapy Treatment Patient Details Name: Donna Marsh MRN: [id number] DOB: Oct 21, 1955 Today's Date: 07/05/2011 [id number] Assessment/Plan  PT - Assessment/Plan PT Frequency: 7X/week Follow Up Recommendations: Home health PT Equipment Recommended: None recommended by PT PT Goals  Acute Rehab PT Goals PT Goal Formulation: With patient Time For Goal Achievement: 7 days Pt will go Supine/Side to Sit: Independently PT Goal: Supine/Side to Sit - Progress: Met Pt will go Sit to Supine/Side: Independently PT Goal: Sit to Supine/Side - Progress: Met Pt will go Sit to Stand: with modified independence PT Goal: Sit to Stand - Progress: Met Pt will go Stand to Sit: with modified independence PT Goal: Stand to Sit - Progress: Met Pt will Ambulate: >150 feet;with modified independence;with rolling walker PT Goal: Ambulate - Progress: Progressing toward goal Pt will Go Up / Down Stairs: 3-5 stairs PT Goal: Up/Down Stairs - Progress: Progressing toward goal Pt will Perform Home Exercise Program: with supervision, verbal cues required/provided PT Goal: Perform Home Exercise Program - Progress: Progressing toward goal  PT Treatment Precautions/Restrictions  Precautions Precautions: Knee Required Braces or Orthoses: Yes Restrictions Weight Bearing Restrictions: No Mobility (including Balance) Bed Mobility Bed Mobility: Yes Supine to Sit: 7: Independent Sit to Supine - Right: 7: Independent Transfers Transfers: Yes Sit to Stand: With upper extremity assist;5: Supervision Stand to Sit: 5: Supervision;To chair/3-in-1 Ambulation/Gait Ambulation/Gait: Yes Ambulation/Gait Assistance: 4: Min assist Ambulation Distance (Feet): 200 Feet Assistive device: Rolling walker Gait Pattern: Step-to pattern  Posture/Postural Control Posture/Postural Control: No significant limitations Exercise  End of Session PT - End of Session Equipment Utilized During Treatment:did not use  Right knee immobilizer Activity Tolerance: Patient tolerated treatment well, increase in pain. To get meds soon Patient left: with call bell in reach;in bed Nurse Communication: Mobility status for transfers General Behavior During Session: [facility] for tasks performed Cognition: [facility] for tasks performed  Rada Hay 07/05/2011, 5:24 PM

## 2011-07-05 NOTE — Progress Notes (Signed)
07/05/2011 Raynelle Bring BSN CCM [phone] CM spoke with patient who plans to go back to her home upon discharge. Patient lives in [place] and spouse will be caregiver. Pt already has RW . States she has no other DME needs. Has used [employer] in the past and wishes to use again if possible. CM spoke with worker's comp-Case managerKathy Hawkins who advised that [facility] [employer] is handling hh services -contact Number [phone] for contract negotiations. [employer] rep advised.

## 2011-07-05 NOTE — Progress Notes (Signed)
Physical Therapy Treatment Patient Details Name: Donna Marsh MRN: [id number] DOB: 18-Aug-1955 Today's Date: 07/05/2011 [id number] PT Assessment/Plan  PT - Assessment/Plan PT Frequency: 7X/week Follow Up Recommendations: Home health PT Equipment Recommended: None recommended by PT PT Goals  Acute Rehab PT Goals PT Goal Formulation: With patient Time For Goal Achievement: 7 days Pt will go Supine/Side to Sit: Independently PT Goal: Supine/Side to Sit - Progress: Progressing toward goal Pt will go Sit to Supine/Side: Independently PT Goal: Sit to Supine/Side - Progress: Progressing toward goal Pt will go Sit to Stand: with modified independence PT Goal: Sit to Stand - Progress: Progressing toward goal Pt will go Stand to Sit: with modified independence PT Goal: Stand to Sit - Progress: Progressing toward goal Pt will Ambulate: >150 feet;with modified independence;with rolling walker PT Goal: Ambulate - Progress: Progressing toward goal Pt will Go Up / Down Stairs: 3-5 stairs PT Goal: Up/Down Stairs - Progress: Progressing toward goal Pt will Perform Home Exercise Program: with supervision, verbal cues required/provided PT Goal: Perform Home Exercise Program - Progress: Progressing toward goal  PT Treatment Precautions/Restrictions  Precautions Precautions: Knee Required Braces or Orthoses: Yes Restrictions Weight Bearing Restrictions: No Mobility (including Balance) Bed Mobility Bed Mobility: Yes Supine to Sit: 5: Supervision Transfers Transfers: Yes Sit to Stand: 4: Min assist;With upper extremity assist Stand to Sit: 5: Supervision;To chair/3-in-1 Ambulation/Gait Ambulation/Gait: Yes Ambulation/Gait Assistance: 4: Min assist Ambulation Distance (Feet): 100 Feet Assistive device: Rolling walker Gait Pattern: Step-to pattern  Posture/Postural Control Posture/Postural Control: No significant limitations Exercise  Total Joint Exercises Quad Sets:  AROM;Right;10 reps;Supine Short Arc Quad: AROM;Right;10 reps;Supine Heel Slides: AROM;Right;10 reps;Supine Hip ABduction/ADduction: AROM;Right;10 reps;Supine Straight Leg Raises: AROM;Right;10 reps;Supine End of Session PT - End of Session Equipment Utilized During Treatment: Right knee immobilizer Activity Tolerance: Patient tolerated treatment well Patient left: in chair;with call bell in reach Nurse Communication: Mobility status for transfers General Behavior During Session: [facility] for tasks performed Cognition: [facility] for tasks performed  Rada Hay 07/05/2011, 5:18 PM

## 2011-07-05 NOTE — Progress Notes (Signed)
Subjective: 1 Day Post-Op Procedure(s) (LRB): TOTAL KNEE REVISION (Right)   Patient reports pain as mild. No events.  Objective:   VITALS:   Filed Vitals:   07/05/11 0510  BP: 112/69  Pulse: 68  Temp: 98.2 F (36.8 C)  Resp: 12    Neurovascular intact Dorsiflexion/Plantar flexion intact Incision: dressing C/D/I No cellulitis present Compartment soft  LABS  Basename 07/05/11 0400  HGB 10.4*  HCT 31.2*  WBC 10.3  PLT 296     Basename 07/05/11 0400  NA 138  K 3.9  BUN 16  CREATININE 0.99  GLUCOSE 147*    Assessment/Plan: 1 Day Post-Op Procedure(s) (LRB): TOTAL KNEE REVISION (Right)  HV drain removed Foley dc'ed IV changed to NSL Advance diet Up with therapy Plan for discharge tomorrow if continues to do well   Anastasio Auerbach. Charlii Yost   PAC  07/05/2011, 7:42 AM

## 2011-07-05 NOTE — Progress Notes (Signed)
OT Screen Order received, chart reviewed. Spoke briefly w/pt who stated that this was her 3rd knee sx & had no DME or OT needs @ this time. No f/u OT needed. Will sign off.  Garrel Ridgel, OTR/L  Pager [phone] 07/05/2011

## 2011-07-06 ENCOUNTER — Encounter (HOSPITAL_COMMUNITY): Payer: Self-pay | Admitting: Orthopedic Surgery

## 2011-07-06 LAB — BASIC METABOLIC PANEL
BUN: 21 mg/dL (ref 6–23)
CO2: 29 mEq/L (ref 19–32)
Chloride: 105 mEq/L (ref 96–112)
Creatinine, Ser: 0.97 mg/dL (ref 0.50–1.10)
GFR calc Af Amer: 75 mL/min — ABNORMAL LOW (ref >90)

## 2011-07-06 LAB — CBC
HCT: 32.3 % — ABNORMAL LOW (ref 36.0–46.0)
MCV: 91.8 fL (ref 78.0–100.0)
RDW: 14.3 % (ref 11.5–15.5)
WBC: 14.4 10*3/uL — ABNORMAL HIGH (ref 4.0–10.5)

## 2011-07-06 MED ORDER — HYDROCODONE-ACETAMINOPHEN 7.5-325 MG PO TABS: 1.0000 | ORAL_TABLET | ORAL | Status: AC

## 2011-07-06 NOTE — Progress Notes (Signed)
Subjective: 2 Days Post-Op Procedure(s) (LRB): TOTAL KNEE REVISION (Right)   Patient reports pain as mild. No events. Feels ready to go home.   Objective:   VITALS:   Filed Vitals:   07/06/11 0540  BP: 147/83  Pulse: 73  Temp: 98.1 F (36.7 C)  Resp: 20    Neurovascular intact Dorsiflexion/Plantar flexion intact Incision: dressing C/D/I No cellulitis present Compartment soft  LABS  Basename 07/06/11 0405 07/05/11 0400  HGB 10.6* 10.4*  HCT 32.3* 31.2*  WBC 14.4* 10.3  PLT 323 296     Basename 07/06/11 0405 07/05/11 0400  NA 142 138  K 4.0 3.9  BUN 21 16  CREATININE 0.97 0.99  GLUCOSE 104* 147*    Assessment/Plan: 2 Days Post-Op Procedure(s) (LRB): TOTAL KNEE REVISION (Right)   Up with therapy Discharge home with home health after PT   Anastasio Auerbach. Leul Narramore   PAC  07/06/2011, 9:17 AM

## 2011-07-06 NOTE — Progress Notes (Signed)
07/06/2011 Raynelle Bring BSN CCM [phone] Genevieve Norlander was authorized to provide hh services for patient. pt for discharge today with start of services tomorrow  07/05/2011 Raynelle Bring BSN CCM [phone] CM spoke with patient who plans to go back to her home upon discharge. Patient lives in [place] and spouse will be caregiver. Pt already has RW . States she has no other DME needs. Has used [facility] in the past and wishes to use again if possible. CM spoke with worker's comp-Case managerKathy Hawkins who advised that [facility] is handling hh services -contact Number [phone] for contract negotiations. [facility] rep advised.

## 2011-07-06 NOTE — Progress Notes (Signed)
Physical Therapy Treatment Patient [id number] Name: Donna Marsh MRN: [id number] DOB: [birth year] Today's Date: 07/06/2011 [id number] PT Assessment/Plan   PT Goals  Acute Rehab PT Goals Pt will go Supine/Side to Sit: Independently Pt will Ambulate: >150 feet;with modified independence;with rolling walker PT Goal: Ambulate - Progress: Met Pt will Go Up / Down Stairs: 3-5 stairs PT Goal: Up/Down Stairs - Progress: Met Pt will Perform Home Exercise Program: with supervision, verbal cues required/provided PT Goal: Perform Home Exercise Program - Progress: Met  PT Treatment Precautions/Restrictions  Precautions Precautions: Knee Required Braces or Orthoses: Yes Restrictions Weight Bearing Restrictions: No Mobility (including Balance) Bed Mobility Bed Mobility: Yes Supine to Sit: 7: Independent Sit to Supine - Right: 7: Independent Transfers Transfers: Yes Sit to Stand: 6: Modified independent (Device/Increase time) Stand to Sit: 6: Modified independent (Device/Increase time);To bed Ambulation/Gait Ambulation/Gait: Yes Ambulation/Gait Assistance: 6: Modified independent (Device/Increase time) Ambulation Distance (Feet): 200 Feet Assistive device: Rolling walker Gait Pattern: Step-through pattern    Exercise    End of Session PT - End of Session Equipment Utilized During Treatment: Right knee immobilizer Activity Tolerance: Patient tolerated treatment well Patient left: in bed General Behavior During Session: [facility] for tasks performed Cognition: [facility] for tasks performed  Rada Hay 07/06/2011, 1:12 PM

## 2012-02-25 ENCOUNTER — Emergency Department: Payer: Self-pay | Admitting: Emergency Medicine

## 2012-04-23 ENCOUNTER — Observation Stay: Payer: Self-pay | Admitting: Internal Medicine

## 2012-04-23 LAB — CBC
HCT: 36.4 % (ref 35.0–47.0)
HGB: 12.7 g/dL (ref 12.0–16.0)
MCHC: 34.9 g/dL (ref 32.0–36.0)
RBC: 4.02 10*6/uL (ref 3.80–5.20)

## 2012-04-23 LAB — BASIC METABOLIC PANEL
Anion Gap: 12 (ref 7–16)
Calcium, Total: 8.5 mg/dL (ref 8.5–10.1)
Creatinine: 1 mg/dL (ref 0.60–1.30)
EGFR (African American): >60
EGFR (Non-African Amer.): >60
Glucose: 105 mg/dL — ABNORMAL HIGH (ref 65–99)
Sodium: 140 mmol/L (ref 136–145)

## 2012-04-24 LAB — LIPID PANEL
Cholesterol: 196 mg/dL (ref 0–200)
Ldl Cholesterol, Calc: 98 mg/dL (ref 0–100)
Triglycerides: 251 mg/dL — ABNORMAL HIGH (ref 0–200)

## 2012-04-24 LAB — BASIC METABOLIC PANEL
Anion Gap: 11 (ref 7–16)
Calcium, Total: 8.3 mg/dL — ABNORMAL LOW (ref 8.5–10.1)
Glucose: 107 mg/dL — ABNORMAL HIGH (ref 65–99)
Osmolality: 292 (ref 275–301)
Sodium: 146 mmol/L — ABNORMAL HIGH (ref 136–145)

## 2012-04-24 LAB — MAGNESIUM: Magnesium: 1.9 mg/dL

## 2012-05-04 ENCOUNTER — Ambulatory Visit: Payer: Self-pay | Admitting: Cardiovascular Disease

## 2012-07-05 ENCOUNTER — Ambulatory Visit: Payer: Self-pay | Admitting: Physician Assistant

## 2012-07-16 ENCOUNTER — Ambulatory Visit: Payer: Self-pay | Admitting: Family Medicine

## 2012-07-25 HISTORY — PX: BACK SURGERY: SHX140

## 2012-08-02 ENCOUNTER — Ambulatory Visit: Payer: Self-pay | Admitting: Family Medicine

## 2012-10-10 ENCOUNTER — Ambulatory Visit (INDEPENDENT_AMBULATORY_CARE_PROVIDER_SITE_OTHER): Payer: No Typology Code available for payment source | Admitting: Radiology

## 2012-10-10 DIAGNOSIS — R404 Transient alteration of awareness: Secondary | ICD-10-CM

## 2012-10-25 ENCOUNTER — Telehealth: Payer: Self-pay

## 2012-10-25 NOTE — Telephone Encounter (Deleted)
Requesting EEG results

## 2012-10-25 NOTE — Telephone Encounter (Signed)
Called pt-gave results

## 2012-12-06 IMAGING — CR DG CHEST 1V PORT
1 series · 1 of 1 positions shown · non-contrast
Comparison: none

REASON FOR EXAM: cp
COMMENTS:

[portable]
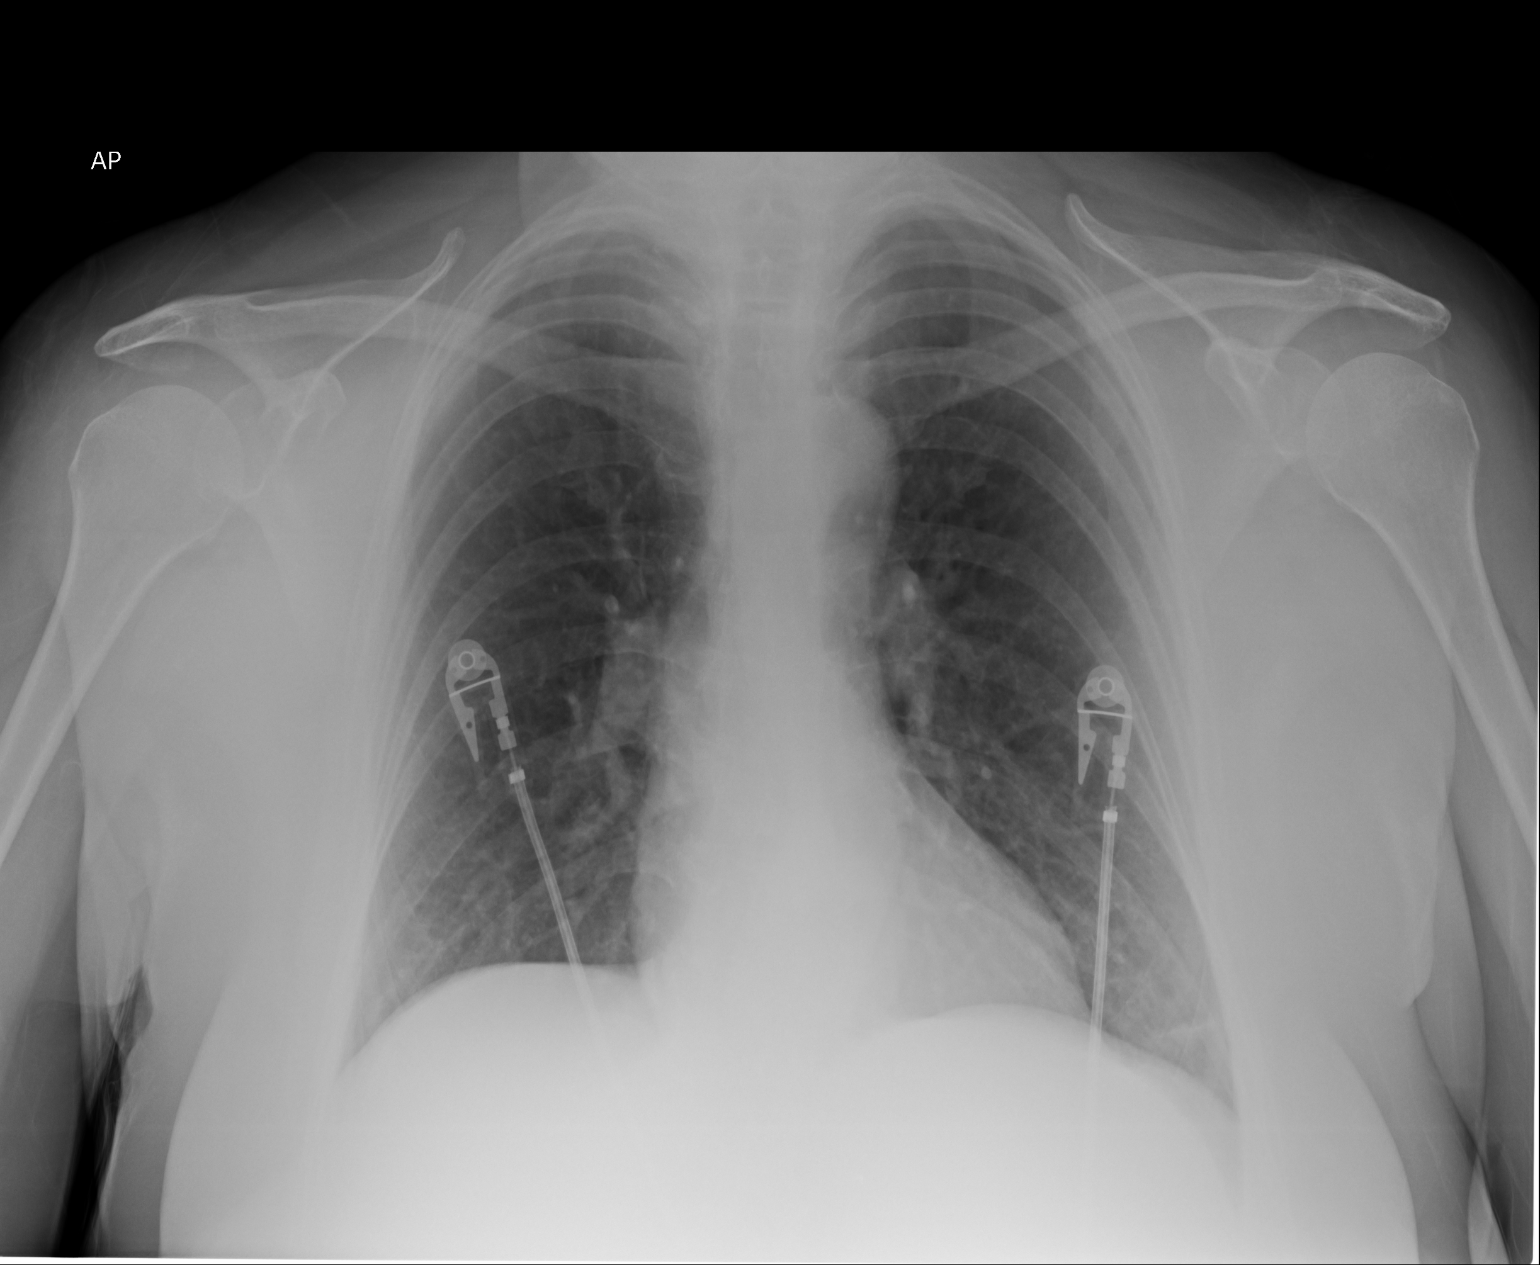

[1 of 1 positions shown; findings below may reference images not displayed]

PROCEDURE:     DXR - DXR PORTABLE CHEST SINGLE VIEW  - April 23, 2012  [DATE]

RESULT:     Comparison is made to study February 25, 2012.

The lungs are well-expanded and clear. The cardiac silhouette is normal in
size. Minimal atelectasis versus scarring is present in the left lateral
costophrenic gutter. The pulmonary vascularity is not engorged. There is no
pleural effusion or pneumothorax. The bony thorax exhibits no acute
abnormality.
IMPRESSION: There is no evidence of acute cardiopulmonary abnormality.
A followup PA and lateral chest x-ray would be of value.

[REDACTED]

## 2013-01-15 ENCOUNTER — Encounter: Payer: Self-pay | Admitting: Physical Medicine and Rehabilitation

## 2013-01-22 ENCOUNTER — Encounter: Payer: Self-pay | Admitting: Physical Medicine and Rehabilitation

## 2013-02-26 ENCOUNTER — Ambulatory Visit: Payer: Self-pay | Admitting: Orthopedic Surgery

## 2013-03-19 ENCOUNTER — Ambulatory Visit: Payer: Self-pay | Admitting: Family Medicine

## 2013-04-08 ENCOUNTER — Ambulatory Visit: Payer: No Typology Code available for payment source | Admitting: Diagnostic Neuroimaging

## 2014-02-19 ENCOUNTER — Ambulatory Visit: Payer: Self-pay | Admitting: Physical Medicine and Rehabilitation

## 2014-03-13 ENCOUNTER — Ambulatory Visit: Payer: Self-pay | Admitting: Physical Medicine and Rehabilitation

## 2014-05-05 ENCOUNTER — Ambulatory Visit: Payer: Self-pay | Admitting: Anesthesiology

## 2014-05-05 LAB — POTASSIUM: Potassium: 4.2 mmol/L (ref 3.5–5.1)

## 2014-05-09 ENCOUNTER — Ambulatory Visit: Payer: Self-pay | Admitting: Specialist

## 2014-07-07 ENCOUNTER — Encounter: Payer: Self-pay | Admitting: Physical Medicine and Rehabilitation

## 2014-07-25 ENCOUNTER — Encounter: Payer: Self-pay | Admitting: Physical Medicine and Rehabilitation

## 2014-11-11 NOTE — Discharge Summary (Signed)
PATIENT NAME:  Donna Marsh, Donna Marsh MR#:  [id number] DATE OF BIRTH:  09/10/1955  DATE OF ADMISSION:  04/23/2012 DATE OF DISCHARGE:  04/24/2012  ADMITTING PHYSICIAN: Dr. Imogene Burn  DISCHARGING PHYSICIAN: Dr. Enid Baas  PRIMARY CARE PHYSICIAN: Dr. Clayborn Bigness   CONSULTATIONS IN THE HOSPITAL: None.   DISCHARGE DIAGNOSES:  1. Musculoskeletal neck pain and headache.   2. Cardiomyopathy with congestive heart failure, systolic dysfunction, ejection fraction 30%, compensated now.  3. Hypertension.  4. Hypokalemia.  5. Depression and anxiety.  6. 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   DISCHARGE MEDICATIONS:  1. Effexor 75 mg p.o. daily.  2. Xanax 1 mg p.o. t.i.d. p.r.n.  3. Skelaxin 400 mg q.8 hours p.r.n. for neck pain.  4. Lisinopril 5 mg p.o. daily.  5. Aspirin 81 mg p.o. daily.  6. Patient advised to hold triamterene/HCTZ.   DISCHARGE DIET: Low sodium, low fat diet.   DISCHARGE ACTIVITY: As tolerated.    FOLLOW-UP INSTRUCTIONS:  1. Primary care physician follow up with Dr. Clayborn Bigness in two weeks.  2. Cardiology follow up with Dr. Adrian Blackwater in 1 to 2 weeks, appointment scheduled for 05/03/2012 at 2:00 p.m.   LABORATORY, DIAGNOSTIC AND RADIOLOGICAL DATA:  Sodium 146, potassium 3.3 that was replaced, chloride 107, bicarbonate 28, BUN 15, creatinine 0.94, glucose 107, calcium 8.3.   WBC 8.1, hemoglobin 12.7, hematocrit 36.4, platelet count 301. Troponin was negative x2 while in the hospital. Hemoglobin A1c 4.9. LDL 98, HDL 48, triglycerides 251, total cholesterol 196. Lexiscan Myoview showing no evidence of ischemia, however, does have severe LV dysfunction, ejection fraction calculated to be around 30%. No perfusion defect seen.   BRIEF HOSPITAL COURSE: Ms. Wilcher is a 59 year old Caucasian female with history of depression, anxiety, migraines, hyperlipidemia who presents to the hospital secondary to chest pain. Her main complaint was she had neck pain  on the right side going to her right shoulder and she had some left shoulder blade pain too. Denies any diaphoresis, palpitations, orthopnea, or dyspnea. She does have family history of heart disease so she was admitted.  1. Chest pain. Could be stable angina versus musculoskeletal. Her Myoview is negative for any active ischemia but she does have severe cardiomyopathy which is newly diagnosed according to her. Her ejection fraction is 30%. Not sure if this is ischemic or nonischemic. She will need a cardiac catheterization as an outpatient. She is scheduled to see Dr. Adrian Blackwater in the office on 05/03/2012 and the results of the stress test and the cardiomyopathy have been explained to patient's husband prior to discharge. Because of the cardiomyopathy she is being discharged on aspirin and lisinopril. Her blood pressure she is low normal and she is not being started on any beta blocker at this time. Medications can be again adjusted in the office. Her triamterene/HCTZ is being stopped at this time because of hypotension and she will be on lisinopril.  2. Her other home medications for depression, anxiety are being continued. Course has been otherwise uneventful in the hospital.   DISCHARGE CONDITION: Stable.   DISCHARGE DISPOSITION: Home.   TIME SPENT ON DISCHARGE: 45 minutes.   ____________________________ Enid Baas, MD rk:cms D: 04/24/2012 15:09:25 ET T: 04/25/2012 10:59:41 ET  JOB#: [id number] cc: Burley Saver, MD Laurier Nancy, MD Enid Baas MD ELECTRONICALLY SIGNED 04/26/2012 14:57

## 2014-11-11 NOTE — H&P (Signed)
PATIENT NAME:  Donna Marsh, Donna Marsh MR#:  [id number] DATE OF BIRTH:  [birth year]  DATE OF ADMISSION:  04/23/2012  CHIEF COMPLAINT: Chest pain today.   HISTORY OF PRESENT ILLNESS: 59 year old Caucasian female with history of hyperlipidemia, depression, migraine headache, lower extremity edema presented to ED with chest pain today. 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 She said she was clammy, sweaty and nauseous but denies any palpitation, orthopnea, or nocturnal dyspnea. Patient has mild shortness of breath but no cough, sputum, or hemoptysis. No fever or chills. Patient said she has stress from work.   PAST MEDICAL HISTORY:  1. Hyperlipidemia. 2. Depression. 3. Migraine headache. 4. Lower extremity edema. 5. Pyelonephritis.   PAST SURGICAL HISTORY:  1. Hysterectomy.  2. Bladder tack. 3. Right knee surgery.   FAMILY HISTORY: Father had a heart attack at around 25 with triple bypass and mother had ovarian cancer.   SOCIAL HISTORY: No smoking, alcohol drinking or illicit drugs.   ALLERGIES: No.   MEDICATIONS:  1. Xanax 1 mg p.o. t.i.d. p.r.n.  2. Tramadol 50 mg p.o. t.i.d. p.r.n.  3. Amitriptyline 50 mg p.o. at bedtime.  4. Triam/HCTZ 37.5/25 mg p.o. daily.  5. Effexor XR 75 mg p.o. daily.   REVIEW OF SYSTEMS: CONSTITUTIONAL: Patient denies any fever, chills. No headache or dizziness. No weakness. EYES: No double vision, blurred vision. ENT: No postnasal drip, epistaxis, slurred speech, or dysphagia. RESPIRATORY: No cough, sputum, shortness of breath, or hemoptysis. CARDIOVASCULAR: Positive for chest pain. No palpitations, orthopnea, or nocturnal dyspnea. No leg edema. GASTROINTESTINAL: No abdominal pain, vomiting, or diarrhea. No melena or bloody stool but has nausea. GENITOURINARY: No dysuria, hematuria, or incontinence. HEMATOLOGY: No easy bruising, bleeding.  NEUROLOGY: No syncope, loss of consciousness or seizure. PSYCH: No anxiety or depression. ENDOCRINE: No polyuria or polydipsia.   PHYSICAL EXAMINATION:  VITAL SIGNS: Temperature 98.1, blood pressure 126/77, pulse 93, respirations 18, oxygen saturation 98% on room air.   GENERAL: Patient is alert, awake, oriented in no acute distress.   HEENT: Pupils round, equal, reactive to light, accommodation. Moist oral mucosa. Clear oropharynx.   NECK: Supple. No JVD or carotid bruit. No lymphadenopathy. No thyromegaly.   CARDIOVASCULAR: S1, S2 regular rate, rhythm. No murmurs, gallops.   PULMONARY: Bilateral air entry. No wheezing, rales. No use of accessory muscles to breathe.   ABDOMEN: Soft. No distention or tenderness. No organomegaly. Bowel sounds present.   EXTREMITIES: No edema, clubbing, or cyanosis. No calf tenderness. Strong bilateral pedal pulses.   NEUROLOGY: Alert and oriented x3. No focal deficit. Power 5/5. Sensation intact.   LABORATORY, DIAGNOSTIC AND RADIOLOGICAL DATA: Glucose 105, BUN 15, creatinine 1, sodium 140, potassium 3.0, chloride 101, bicarbonate 27. CBC normal. Troponin less than 0.02. EKG showed normal sinus rhythm at 85 beats per minute.   IMPRESSION:  1. Chest pain, rule out ACS.  2. Hypokalemia.  3. Hyperlipidemia.   PLAN OF TREATMENT:  1. The patient will be placed for observation. Will continue tele monitor. Give aspirin 325 mg p.o. daily. Start Zocor 40 mg p.o. at bedtime. Will give low-dose Lopressor, lisinopril and follow up troponin level, lipid panel and hemoglobin A1c.  2. We will get a stress test.  3. For hypokalemia will give potassium supplement and follow-up BMP and magnesium level.  4. GI and deep vein thrombosis prophylaxis.   Discussed the patient's situation, the plan of treatment  with patient.   TIME SPENT: About 50 minutes.   ____________________________ Shaune Pollack, MD qc:cms D: 04/23/2012 18:44:39 ET T: 04/24/2012 05:44:37  ET JOB#: [id number]  cc: Shaune Pollack, MD, <Dictator> Shaune Pollack MD ELECTRONICALLY SIGNED 04/25/2012 15:06

## 2014-11-15 NOTE — Op Note (Signed)
PATIENT NAME:  Donna Marsh, Donna Marsh MR#:  [id number] DATE OF BIRTH:  01/13/1956  DATE OF PROCEDURE:  05/09/2014  PREOPERATIVE DIAGNOSES:  1.  Severe degenerative arthritis, base of left thumb.  2.  Dorsal carpal boss, left hand. 3.  A1 pulley ganglion cyst, left thumb.   POSTOPERATIVE DIAGNOSES:  1.  Severe degenerative arthritis, base of left thumb.  2.  Dorsal carpal boss, left hand. 3.  A1 pulley ganglion cyst, left thumb.   PROCEDURES: 1.  Excisional trapezium arthroplasty, base of left thumb.  2.  Excision of dorsal carpal boss, left hand.  3.  Aspiration of volar A1 pulley cyst, left thumb.   SURGEON: Myra Rude, M.D.   ANESTHESIA: General.   COMPLICATIONS: None.   TOURNIQUET TIME: Approximately 75 minutes.   DESCRIPTION OF PROCEDURE: After adequate induction of general anesthesia, 2 grams of Ancef were given intravenously. The left upper extremity is thoroughly prepped with alcohol and ChloraPrep and draped in standard sterile fashion. The extremity is wrapped out with the Esmarch bandage and pneumatic tourniquet elevated to 250 mmHg. First, the cyst on the volar A1 pulley of the thumb was carefully palpated and then is aspirated with an 18-gauge needle. At the close of the procedure, the cyst was gone and no further treatment was felt necessary. Attention was then turned to a mass on the dorsum of the left hand, in the midline at the base of the third metacarpal. Transverse incision is made and the dissection carried down under loupe magnification. Tendons are retracted to each side. The extensor retinaculum is incised horizontally. The mass is completely dissected out and this is seen to be a carpal boss. The rongeur is used to completely resect the mass down to level tissue. The wound is thoroughly irrigated multiple times. Extensor tendon retinaculum is repaired with 5-0 Vicryl. The skin is closed with a running subcuticular 3-0 Prolene. Attention is then turned to the base  of the left thumb. Longitudinal incision is made dorsally over the carpometacarpal joint. The dissection is carried down with retraction of the tendons and cutaneous nerves to each side. The capsule is bluntly dissected out. Careful longitudinal incision is then made in the capsule and this is reflected to each side and preserved for later repair. The trapezium is completely dissected out on the periphery. The central core of the trapezium is removed with the TPS bur. The remainder of the trapezium is removed with the rongeur. The wound is thoroughly irrigated multiple times. Careful palpation demonstrates no residual bone present. Three small Gelfoam strips were then folded and bent into the form of a trapezium and sewn in this position using 4-0 Mersilene. The Gelfoam block is then securely sutured to the base of the wound through the tendon at the base. Dorsal capsule is then carefully repaired with 4-0 Mersilene. One subcutaneous 5-0 Vicryl suture is placed and the incision is closed with a 3-0 Prolene suture. Soft bulky dressing is applied along with a fiberglass splint. The patient is returned to the recovery room in satisfactory condition having tolerated the procedure quite well.  ____________________________ Clare Gandy, MD ces:sb D: 05/09/2014 09:40:08 ET T: 05/09/2014 09:48:05 ET JOB#: [id number]  cc: Clare Gandy, MD, <Dictator> Clare Gandy MD ELECTRONICALLY SIGNED 05/17/2014 14:59

## 2014-12-30 ENCOUNTER — Other Ambulatory Visit: Payer: Self-pay | Admitting: Orthopedic Surgery

## 2015-01-06 ENCOUNTER — Encounter (HOSPITAL_COMMUNITY): Payer: Self-pay

## 2015-01-06 ENCOUNTER — Encounter (HOSPITAL_COMMUNITY)
Admission: RE | Admit: 2015-01-06 | Discharge: 2015-01-06 | Disposition: A | Discharge status: Home / Self Care | Payer: Worker's Compensation | Source: Ambulatory Visit | Attending: Orthopedic Surgery | Admitting: Orthopedic Surgery

## 2015-01-06 ENCOUNTER — Ambulatory Visit (HOSPITAL_COMMUNITY): Admission: RE | Admit: 2015-01-06 | Payer: Worker's Compensation | Source: Ambulatory Visit

## 2015-01-06 DIAGNOSIS — Z01818 Encounter for other preprocedural examination: Secondary | ICD-10-CM

## 2015-01-06 HISTORY — DX: Anemia, unspecified: D64.9

## 2015-01-06 HISTORY — DX: Angina pectoris, unspecified: I20.9

## 2015-01-06 LAB — COMPREHENSIVE METABOLIC PANEL
ALT: 18 U/L (ref 14–54)
ANION GAP: 9 (ref 5–15)
AST: 24 U/L (ref 15–41)
Albumin: 4.4 g/dL (ref 3.5–5.0)
Alkaline Phosphatase: 101 U/L (ref 38–126)
BUN: 22 mg/dL — ABNORMAL HIGH (ref 6–20)
CALCIUM: 9.8 mg/dL (ref 8.9–10.3)
CHLORIDE: 103 mmol/L (ref 101–111)
CO2: 26 mmol/L (ref 22–32)
CREATININE: 1.3 mg/dL — AB (ref 0.44–1.00)
GFR calc Af Amer: 51 mL/min — ABNORMAL LOW (ref >60)
GFR, EST NON AFRICAN AMERICAN: 44 mL/min — AB (ref >60)
GLUCOSE: 77 mg/dL (ref 65–99)
Potassium: 3.6 mmol/L (ref 3.5–5.1)
Sodium: 138 mmol/L (ref 135–145)
Total Bilirubin: 0.5 mg/dL (ref 0.3–1.2)
Total Protein: 8 g/dL (ref 6.5–8.1)

## 2015-01-06 LAB — CBC WITH DIFFERENTIAL/PLATELET
BASOS PCT: 0 % (ref 0–1)
Basophils Absolute: 0 10*3/uL (ref 0.0–0.1)
Eosinophils Absolute: 0.3 10*3/uL (ref 0.0–0.7)
Eosinophils Relative: 5 % (ref 0–5)
HCT: 42.6 % (ref 36.0–46.0)
HEMOGLOBIN: 13.9 g/dL (ref 12.0–15.0)
Lymphocytes Relative: 26 % (ref 12–46)
Lymphs Abs: 1.8 10*3/uL (ref 0.7–4.0)
MCH: 29.3 pg (ref 26.0–34.0)
MCHC: 32.6 g/dL (ref 30.0–36.0)
MCV: 89.7 fL (ref 78.0–100.0)
MONOS PCT: 6 % (ref 3–12)
Monocytes Absolute: 0.4 10*3/uL (ref 0.1–1.0)
NEUTROS ABS: 4.4 10*3/uL (ref 1.7–7.7)
NEUTROS PCT: 63 % (ref 43–77)
PLATELETS: 323 10*3/uL (ref 150–400)
RBC: 4.75 MIL/uL (ref 3.87–5.11)
RDW: 13.9 % (ref 11.5–15.5)
WBC: 6.9 10*3/uL (ref 4.0–10.5)

## 2015-01-06 LAB — PROTIME-INR
INR: 1.06 (ref 0.00–1.49)
Prothrombin Time: 14 seconds (ref 11.6–15.2)

## 2015-01-06 LAB — APTT: aPTT: 31 seconds (ref 24–37)

## 2015-01-06 LAB — TYPE AND SCREEN
ABO/RH(D): A NEG
ANTIBODY SCREEN: NEGATIVE

## 2015-01-06 LAB — SURGICAL PCR SCREEN
MRSA, PCR: NEGATIVE
STAPHYLOCOCCUS AUREUS: POSITIVE — AB

## 2015-01-06 NOTE — Pre-Procedure Instructions (Signed)
   Donna Marsh 01/06/2015      [id number] [place], [place] - [address] [place] [address] Phone: [phone] Fax: [phone]    Your procedure is scheduled on Thursday January 08, 2015 at 12:28 PM.  Report to [facility] Admitting at 9:30 A.M.  Call this number if you have problems the morning of surgery:  [phone]   Remember:  Do not eat food or drink liquids after midnight.  Take these medicines the morning of surgery with A SIP OF WATER Tylenol #3, alprazalam (Xanax), baclofen (Lioresal), lansoprazole (Prevacid), pantoprazole (Protonix), vanlafaxine (Effexor-XR).   Do not wear jewelry, make-up or nail polish.  Do not wear lotions, powders, or perfumes.    Do not shave 48 hours prior to surgery.    Do not bring valuables to the hospital.  [facility] is not responsible for any belongings or valuables.  Contacts, dentures or bridgework may not be worn into surgery.  Leave your suitcase in the car.  After surgery it may be brought to your room.  For patients admitted to the hospital, discharge time will be determined by your treatment team.  Patients discharged the day of surgery will not be allowed to drive home.    Please read over the following fact sheets that you were given. Pain Booklet, Coughing and Deep Breathing, Blood Transfusion Information, MRSA Information and Surgical Site Infection Prevention

## 2015-01-06 NOTE — Progress Notes (Signed)
   01/06/15 1033  OBSTRUCTIVE SLEEP APNEA  Have you ever been diagnosed with sleep apnea through a sleep study? No  Do you snore loudly (loud enough to be heard through closed doors)?  1  Do you often feel tired, fatigued, or sleepy during the daytime? 1  Has anyone observed you stop breathing during your sleep? 0  Do you have, or are you being treated for high blood pressure? 0  BMI more than 35 kg/m2? 0  Age over 59 years old? 1  Neck circumference greater than 40 cm/16 inches? 0 (15)  Gender: 0  Obstructive Sleep Apnea Score 3

## 2015-01-06 NOTE — Progress Notes (Addendum)
I called a prescription for Mupirocin ointment to [facility], [place], I reported that this is a Surveyor, quantity comp prescription.

## 2015-01-07 MED ORDER — CEFAZOLIN SODIUM-DEXTROSE 2-3 GM-% IV SOLR
2.0000 g | INTRAVENOUS | Status: AC
  Administered 2015-01-08: 2 g via INTRAVENOUS
  Filled 2015-01-07: qty 50

## 2015-01-07 NOTE — H&P (Signed)
PREOPERATIVE H&P  Chief Complaint: Bilateral leg pain, low back pain  HPI: Donna Marsh is a 59 y.o. female who presents with ongoing pain in the bilateral legs and low back  MRI reveals DDD, facet ypertrophy, and stenosis L4-S1  Patient has failed multiple forms of conservative care and continues to have pain (see office notes for additional details regarding the patient's full course of treatment)  Past Medical History  Diagnosis Date  . Headache(784.0)     HX OF MIGRAINES  . Anxiety   . Depression   . Hypercholesterolemia   . Varicose veins     LEFT LEG  . UTI (lower urinary tract infection)     FREQUENTLY--JUST FINISHED ANTIBIOTIC SAT 06/25/11 FOR UTI  . URI (upper respiratory infection)     URI IN OCT 2012--PT FEELS CLEAR NOW--DOES NOT USUALLY HAVE ANY PROBLEMS WITH URI  . Arthritis     RA AND OA .    Marland Kitchen Edema     HX OF SWELLING OF FEET, LEGS, HANDS AND FACE--TAKES TRIAMTERENE/HCTZ FOR PREVENTION--DOES NOT HAVE HYPERTENSION AND HAS BEEN TOLD B/P USUALLY LOW  . Alopecia universalis   . Blood transfusion 1979    WITH PREGNANCY  . GERD (gastroesophageal reflux disease)     OCCAS--OTC MED IF ANYTHING NEEDED  . PONV (postoperative nausea and vomiting)   . Anemia     in 1979  . Anginal pain     hx of CP in 2013, went to the hospital, everything was negative   Past Surgical History  Procedure Laterality Date  . Cholecystectomy  2011  . Joint replacement  JAN 2006    LEFT TOTAL KNEE ARTHROPLASTY  A  . Joint replacement  JAN 2012    RIGHT TOTAL KNEE ARTHROPLASTY  . Abdominal hysterectomy    . Total knee revision  07/04/2011    Procedure: bilateral TOTAL KNEE REVISION;  Surgeon: Shelda Pal;  Location: [facility];  Service: Orthopedics;  Laterality: Right;  Right Total Knee Revision with Scar Debridement/Patella Revision with Poly Exchange   History   Social History  . Marital Status: Married    Spouse Name: N/A  . Number of Children: N/A  . Years of  Education: N/A   Social History Main Topics  . Smoking status: Never Smoker   . Smokeless tobacco: Never Used  . Alcohol Use: Yes     Comment: RARELY  . Drug Use: No  . Sexual Activity: Not on file   Other Topics Concern  . Not on file   Social History Narrative   No family history on file. No Known Allergies Prior to Admission medications   Medication Sig Start Date End Date Taking? Authorizing Provider  acetaminophen-codeine (TYLENOL #3) 300-30 MG per tablet Take 1 tablet by mouth every 6 (six) hours as needed for moderate pain.   Yes Historical Provider, MD  ALPRAZolam Prudy Feeler) 1 MG tablet Take 1 mg by mouth 3 (three) times daily.    Yes Historical Provider, MD  baclofen (LIORESAL) 10 MG tablet Take 10 mg by mouth 2 (two) times daily.   Yes Historical Provider, MD  doxepin (SINEQUAN) 25 MG capsule Take 100 mg by mouth at bedtime.   Yes Historical Provider, MD  lansoprazole (PREVACID) 30 MG capsule Take 30 mg by mouth 2 (two) times daily.   Yes Historical Provider, MD  lubiprostone (AMITIZA) 24 MCG capsule Take 24 mcg by mouth 2 (two) times daily with a meal.   Yes Historical  Provider, MD  pantoprazole (PROTONIX) 40 MG tablet Take 40 mg by mouth daily.   Yes Historical Provider, MD  spironolactone (ALDACTONE) 50 MG tablet Take 150 mg by mouth daily.   Yes Historical Provider, MD  venlafaxine (EFFEXOR-XR) 75 MG 24 hr capsule Take 75 mg by mouth every morning.     Yes Historical Provider, MD     All other systems have been reviewed and were otherwise negative with the exception of those mentioned in the HPI and as above.  Physical Exam: There were no vitals filed for this visit.  General: Alert, no acute distress Cardiovascular: No pedal edema Respiratory: No cyanosis, no use of accessory musculature Skin: No lesions in the area of chief complaint Neurologic: Sensation intact distally Psychiatric: Patient is competent for consent with normal mood and affect Lymphatic: No  axillary or cervical lymphadenopathy   Assessment/Plan: Bilateral leg pain, low back pain Plan for Procedure(s): POSTERIOR LUMBAR FUSION 2 LEVEL   Emilee Hero, MD 01/07/2015 2:37 PM

## 2015-01-08 ENCOUNTER — Inpatient Hospital Stay (HOSPITAL_COMMUNITY): Payer: Worker's Compensation | Admitting: Certified Registered"

## 2015-01-08 ENCOUNTER — Inpatient Hospital Stay (HOSPITAL_COMMUNITY)
Admission: RE | Admit: 2015-01-08 | Discharge: 2015-01-10 | DRG: 460 | Disposition: A | Discharge status: Home / Self Care | Payer: Worker's Compensation | Source: Ambulatory Visit | Attending: Orthopedic Surgery | Admitting: Orthopedic Surgery

## 2015-01-08 ENCOUNTER — Inpatient Hospital Stay (HOSPITAL_COMMUNITY): Payer: Worker's Compensation

## 2015-01-08 ENCOUNTER — Encounter (HOSPITAL_COMMUNITY)
Admission: RE | Disposition: A | Discharge status: Home / Self Care | Payer: Worker's Compensation | Source: Ambulatory Visit | Attending: Orthopedic Surgery

## 2015-01-08 ENCOUNTER — Encounter (HOSPITAL_COMMUNITY): Payer: Self-pay | Admitting: Certified Registered"

## 2015-01-08 DIAGNOSIS — Z419 Encounter for procedure for purposes other than remedying health state, unspecified: Secondary | ICD-10-CM

## 2015-01-08 DIAGNOSIS — E780 Pure hypercholesterolemia: Secondary | ICD-10-CM | POA: Diagnosis present

## 2015-01-08 DIAGNOSIS — M4806 Spinal stenosis, lumbar region: Secondary | ICD-10-CM | POA: Diagnosis present

## 2015-01-08 DIAGNOSIS — M4800 Spinal stenosis, site unspecified: Secondary | ICD-10-CM | POA: Diagnosis present

## 2015-01-08 DIAGNOSIS — Z96653 Presence of artificial knee joint, bilateral: Secondary | ICD-10-CM | POA: Diagnosis present

## 2015-01-08 DIAGNOSIS — K219 Gastro-esophageal reflux disease without esophagitis: Secondary | ICD-10-CM | POA: Diagnosis present

## 2015-01-08 DIAGNOSIS — F329 Major depressive disorder, single episode, unspecified: Secondary | ICD-10-CM | POA: Diagnosis present

## 2015-01-08 DIAGNOSIS — F419 Anxiety disorder, unspecified: Secondary | ICD-10-CM | POA: Diagnosis present

## 2015-01-08 DIAGNOSIS — M5116 Intervertebral disc disorders with radiculopathy, lumbar region: Secondary | ICD-10-CM | POA: Diagnosis present

## 2015-01-08 LAB — URINALYSIS W MICROSCOPIC (NOT AT ARMC)
Bilirubin Urine: NEGATIVE
Glucose, UA: NEGATIVE mg/dL
HGB URINE DIPSTICK: NEGATIVE
Ketones, ur: NEGATIVE mg/dL
Nitrite: POSITIVE — AB
PH: 5 (ref 5.0–8.0)
Protein, ur: NEGATIVE mg/dL
SPECIFIC GRAVITY, URINE: 1.014 (ref 1.005–1.030)
UROBILINOGEN UA: 0.2 mg/dL (ref 0.0–1.0)

## 2015-01-08 SURGERY — POSTERIOR LUMBAR FUSION 2 LEVEL
Anesthesia: General | Site: Spine Lumbar | Laterality: Left

## 2015-01-08 MED ORDER — ONDANSETRON HCL 4 MG/2ML IJ SOLN
INTRAMUSCULAR | Status: AC
  Filled 2015-01-08: qty 4

## 2015-01-08 MED ORDER — SPIRONOLACTONE 100 MG PO TABS
150.0000 mg | ORAL_TABLET | Freq: Every day | ORAL | Status: DC
  Administered 2015-01-08 – 2015-01-10 (×3): 150 mg via ORAL
  Filled 2015-01-08 (×3): qty 1

## 2015-01-08 MED ORDER — PROPOFOL INFUSION 10 MG/ML OPTIME
INTRAVENOUS | Status: DC | PRN
  Administered 2015-01-08: 25 ug/kg/min via INTRAVENOUS
  Administered 2015-01-08: 50 ug/kg/min via INTRAVENOUS

## 2015-01-08 MED ORDER — PROMETHAZINE HCL 25 MG/ML IJ SOLN: 6.2500 mg | INTRAMUSCULAR | Status: DC | PRN

## 2015-01-08 MED ORDER — SUCCINYLCHOLINE CHLORIDE 20 MG/ML IJ SOLN
INTRAMUSCULAR | Status: DC | PRN
  Administered 2015-01-08: 100 mg via INTRAVENOUS

## 2015-01-08 MED ORDER — MORPHINE SULFATE 2 MG/ML IJ SOLN
1.0000 mg | INTRAMUSCULAR | Status: DC | PRN
  Administered 2015-01-08 – 2015-01-10 (×9): 2 mg via INTRAVENOUS
  Filled 2015-01-08 (×8): qty 1

## 2015-01-08 MED ORDER — FLEET ENEMA 7-19 GM/118ML RE ENEM: 1.0000 | ENEMA | Freq: Once | RECTAL | Status: AC | PRN

## 2015-01-08 MED ORDER — PROPOFOL 10 MG/ML IV BOLUS
INTRAVENOUS | Status: AC
  Filled 2015-01-08: qty 20

## 2015-01-08 MED ORDER — SUFENTANIL CITRATE 50 MCG/ML IV SOLN
INTRAVENOUS | Status: DC | PRN
  Administered 2015-01-08 (×5): 10 ug via INTRAVENOUS

## 2015-01-08 MED ORDER — ACETAMINOPHEN 650 MG RE SUPP: 650.0000 mg | RECTAL | Status: DC | PRN

## 2015-01-08 MED ORDER — SODIUM CHLORIDE 0.9 % IJ SOLN: 3.0000 mL | INTRAMUSCULAR | Status: DC | PRN

## 2015-01-08 MED ORDER — ONDANSETRON HCL 4 MG/2ML IJ SOLN
INTRAMUSCULAR | Status: DC | PRN
  Administered 2015-01-08: 4 mg via INTRAVENOUS

## 2015-01-08 MED ORDER — DIAZEPAM 5 MG PO TABS
5.0000 mg | ORAL_TABLET | Freq: Four times a day (QID) | ORAL | Status: DC | PRN
  Administered 2015-01-08 – 2015-01-09 (×3): 5 mg via ORAL
  Filled 2015-01-08 (×2): qty 1

## 2015-01-08 MED ORDER — DEXAMETHASONE SODIUM PHOSPHATE 10 MG/ML IJ SOLN
INTRAMUSCULAR | Status: AC
  Filled 2015-01-08: qty 1

## 2015-01-08 MED ORDER — HYDROMORPHONE HCL 1 MG/ML IJ SOLN
INTRAMUSCULAR | Status: AC
  Filled 2015-01-08: qty 1

## 2015-01-08 MED ORDER — MENTHOL 3 MG MT LOZG: 1.0000 | LOZENGE | OROMUCOSAL | Status: DC | PRN

## 2015-01-08 MED ORDER — ALUM & MAG HYDROXIDE-SIMETH 200-200-20 MG/5ML PO SUSP: 30.0000 mL | Freq: Four times a day (QID) | ORAL | Status: DC | PRN

## 2015-01-08 MED ORDER — FENTANYL CITRATE (PF) 250 MCG/5ML IJ SOLN
INTRAMUSCULAR | Status: AC
  Filled 2015-01-08: qty 5

## 2015-01-08 MED ORDER — DOXEPIN HCL 100 MG PO CAPS
100.0000 mg | ORAL_CAPSULE | Freq: Every day | ORAL | Status: DC
  Administered 2015-01-08 – 2015-01-09 (×2): 100 mg via ORAL
  Filled 2015-01-08 (×3): qty 1

## 2015-01-08 MED ORDER — VENLAFAXINE HCL ER 75 MG PO CP24
75.0000 mg | ORAL_CAPSULE | ORAL | Status: DC
  Administered 2015-01-09 – 2015-01-10 (×2): 75 mg via ORAL
  Filled 2015-01-08 (×2): qty 1

## 2015-01-08 MED ORDER — DEXAMETHASONE SODIUM PHOSPHATE 10 MG/ML IJ SOLN
INTRAMUSCULAR | Status: DC | PRN
  Administered 2015-01-08: 10 mg via INTRAVENOUS

## 2015-01-08 MED ORDER — LIDOCAINE HCL (CARDIAC) 20 MG/ML IV SOLN
INTRAVENOUS | Status: DC | PRN
  Administered 2015-01-08: 80 mg via INTRAVENOUS

## 2015-01-08 MED ORDER — VECURONIUM BROMIDE 10 MG IV SOLR
INTRAVENOUS | Status: DC | PRN
  Administered 2015-01-08: 1 mg via INTRAVENOUS
  Administered 2015-01-08: 5 mg via INTRAVENOUS

## 2015-01-08 MED ORDER — DIAZEPAM 5 MG PO TABS
ORAL_TABLET | ORAL | Status: AC
  Filled 2015-01-08: qty 1

## 2015-01-08 MED ORDER — FENTANYL CITRATE (PF) 250 MCG/5ML IJ SOLN
INTRAMUSCULAR | Status: DC | PRN
  Administered 2015-01-08: 50 ug via INTRAVENOUS

## 2015-01-08 MED ORDER — ACETAMINOPHEN 325 MG PO TABS: 650.0000 mg | ORAL_TABLET | ORAL | Status: DC | PRN

## 2015-01-08 MED ORDER — DIPHENHYDRAMINE HCL 50 MG/ML IJ SOLN
INTRAMUSCULAR | Status: DC | PRN
  Administered 2015-01-08: 10 mg via INTRAVENOUS

## 2015-01-08 MED ORDER — LACTATED RINGERS IV SOLN
INTRAVENOUS | Status: DC
  Administered 2015-01-08 (×3): via INTRAVENOUS

## 2015-01-08 MED ORDER — CEFAZOLIN SODIUM 1-5 GM-% IV SOLN
1.0000 g | Freq: Three times a day (TID) | INTRAVENOUS | Status: AC
  Administered 2015-01-08 – 2015-01-09 (×2): 1 g via INTRAVENOUS
  Filled 2015-01-08 (×2): qty 50

## 2015-01-08 MED ORDER — HEMOSTATIC AGENTS (NO CHARGE) OPTIME
TOPICAL | Status: DC | PRN
  Administered 2015-01-08: 1 via TOPICAL

## 2015-01-08 MED ORDER — METHYLENE BLUE 1 % INJ SOLN
INTRAMUSCULAR | Status: AC
  Filled 2015-01-08: qty 10

## 2015-01-08 MED ORDER — GLYCOPYRROLATE 0.2 MG/ML IJ SOLN
INTRAMUSCULAR | Status: DC | PRN
  Administered 2015-01-08: 0.2 mg via INTRAVENOUS

## 2015-01-08 MED ORDER — SODIUM CHLORIDE 0.9 % IV SOLN
INTRAVENOUS | Status: DC
  Administered 2015-01-09 – 2015-01-10 (×2): via INTRAVENOUS

## 2015-01-08 MED ORDER — PHENOL 1.4 % MT LIQD: 1.0000 | OROMUCOSAL | Status: DC | PRN

## 2015-01-08 MED ORDER — ZOLPIDEM TARTRATE 5 MG PO TABS: 5.0000 mg | ORAL_TABLET | Freq: Every evening | ORAL | Status: DC | PRN

## 2015-01-08 MED ORDER — VECURONIUM BROMIDE 10 MG IV SOLR
INTRAVENOUS | Status: AC
  Filled 2015-01-08: qty 10

## 2015-01-08 MED ORDER — HYDROCODONE-ACETAMINOPHEN 5-325 MG PO TABS: 1.0000 | ORAL_TABLET | ORAL | Status: DC | PRN

## 2015-01-08 MED ORDER — SENNOSIDES-DOCUSATE SODIUM 8.6-50 MG PO TABS: 1.0000 | ORAL_TABLET | Freq: Every evening | ORAL | Status: DC | PRN

## 2015-01-08 MED ORDER — PANTOPRAZOLE SODIUM 40 MG PO TBEC
40.0000 mg | DELAYED_RELEASE_TABLET | Freq: Every day | ORAL | Status: DC
Start: 2015-01-09 — End: 2015-01-10
  Administered 2015-01-09 – 2015-01-10 (×2): 40 mg via ORAL
  Filled 2015-01-08 (×2): qty 1

## 2015-01-08 MED ORDER — MIDAZOLAM HCL 2 MG/2ML IJ SOLN
INTRAMUSCULAR | Status: AC
  Filled 2015-01-08: qty 2

## 2015-01-08 MED ORDER — PROPOFOL 10 MG/ML IV BOLUS
INTRAVENOUS | Status: DC | PRN
  Administered 2015-01-08: 160 mg via INTRAVENOUS

## 2015-01-08 MED ORDER — OXYCODONE-ACETAMINOPHEN 5-325 MG PO TABS
1.0000 | ORAL_TABLET | ORAL | Status: DC | PRN
  Administered 2015-01-08 – 2015-01-10 (×8): 2 via ORAL
  Filled 2015-01-08 (×8): qty 2

## 2015-01-08 MED ORDER — NEOSTIGMINE METHYLSULFATE 10 MG/10ML IV SOLN
INTRAVENOUS | Status: DC | PRN
  Administered 2015-01-08: 2 mg via INTRAVENOUS

## 2015-01-08 MED ORDER — LUBIPROSTONE 24 MCG PO CAPS
24.0000 ug | ORAL_CAPSULE | Freq: Two times a day (BID) | ORAL | Status: DC
  Administered 2015-01-09 – 2015-01-10 (×3): 24 ug via ORAL
  Filled 2015-01-08 (×5): qty 1

## 2015-01-08 MED ORDER — DIPHENHYDRAMINE HCL 50 MG/ML IJ SOLN
INTRAMUSCULAR | Status: AC
  Filled 2015-01-08: qty 1

## 2015-01-08 MED ORDER — SODIUM CHLORIDE 0.9 % IJ SOLN
3.0000 mL | Freq: Two times a day (BID) | INTRAMUSCULAR | Status: DC
Start: 2015-01-08 — End: 2015-01-10
  Administered 2015-01-09: 3 mL via INTRAVENOUS

## 2015-01-08 MED ORDER — MIDAZOLAM HCL 5 MG/5ML IJ SOLN
INTRAMUSCULAR | Status: DC | PRN
  Administered 2015-01-08: 2 mg via INTRAVENOUS

## 2015-01-08 MED ORDER — BUPIVACAINE-EPINEPHRINE 0.25% -1:200000 IJ SOLN
INTRAMUSCULAR | Status: DC | PRN
  Administered 2015-01-08: 5 mL
  Administered 2015-01-08: 20 mL

## 2015-01-08 MED ORDER — ONDANSETRON HCL 4 MG/2ML IJ SOLN: 4.0000 mg | INTRAMUSCULAR | Status: DC | PRN

## 2015-01-08 MED ORDER — SUFENTANIL CITRATE 50 MCG/ML IV SOLN
INTRAVENOUS | Status: AC
  Filled 2015-01-08: qty 1

## 2015-01-08 MED ORDER — DOCUSATE SODIUM 100 MG PO CAPS
100.0000 mg | ORAL_CAPSULE | Freq: Two times a day (BID) | ORAL | Status: DC
  Administered 2015-01-08 – 2015-01-10 (×4): 100 mg via ORAL
  Filled 2015-01-08 (×4): qty 1

## 2015-01-08 MED ORDER — METHYLENE BLUE 1 % INJ SOLN
INTRAMUSCULAR | Status: DC | PRN
  Administered 2015-01-08: .3 mL via INTRADERMAL

## 2015-01-08 MED ORDER — 0.9 % SODIUM CHLORIDE (POUR BTL) OPTIME
TOPICAL | Status: DC | PRN
  Administered 2015-01-08 (×4): 1000 mL

## 2015-01-08 MED ORDER — HYDROMORPHONE HCL 1 MG/ML IJ SOLN
0.2500 mg | INTRAMUSCULAR | Status: DC | PRN
  Administered 2015-01-08 (×4): 0.5 mg via INTRAVENOUS

## 2015-01-08 MED ORDER — LIDOCAINE HCL (CARDIAC) 20 MG/ML IV SOLN
INTRAVENOUS | Status: AC
  Filled 2015-01-08: qty 5

## 2015-01-08 MED ORDER — STERILE WATER FOR INJECTION IJ SOLN
INTRAMUSCULAR | Status: AC
  Filled 2015-01-08: qty 10

## 2015-01-08 MED ORDER — BUPIVACAINE-EPINEPHRINE (PF) 0.25% -1:200000 IJ SOLN
INTRAMUSCULAR | Status: AC
  Filled 2015-01-08: qty 30

## 2015-01-08 MED ORDER — POVIDONE-IODINE 7.5 % EX SOLN
Freq: Once | CUTANEOUS | Status: DC
  Filled 2015-01-08: qty 118

## 2015-01-08 MED ORDER — ALPRAZOLAM 0.5 MG PO TABS
1.0000 mg | ORAL_TABLET | Freq: Three times a day (TID) | ORAL | Status: DC
  Administered 2015-01-08 – 2015-01-10 (×5): 1 mg via ORAL
  Filled 2015-01-08 (×5): qty 2

## 2015-01-08 MED ORDER — BISACODYL 5 MG PO TBEC: 5.0000 mg | DELAYED_RELEASE_TABLET | Freq: Every day | ORAL | Status: DC | PRN

## 2015-01-08 MED ORDER — THROMBIN 20000 UNITS EX SOLR
CUTANEOUS | Status: AC
  Filled 2015-01-08: qty 20000

## 2015-01-08 MED ORDER — THROMBIN 20000 UNITS EX KIT
PACK | CUTANEOUS | Status: DC | PRN
  Administered 2015-01-08: 20000 [IU] via TOPICAL

## 2015-01-08 MED ORDER — MORPHINE SULFATE 2 MG/ML IJ SOLN
INTRAMUSCULAR | Status: AC
  Filled 2015-01-08: qty 1

## 2015-01-08 MED ORDER — SODIUM CHLORIDE 0.9 % IV SOLN
250.0000 mL | INTRAVENOUS | Status: DC
Start: 2015-01-08 — End: 2015-01-10
  Administered 2015-01-08: 250 mL via INTRAVENOUS

## 2015-01-08 SURGICAL SUPPLY — 88 items
BENZOIN TINCTURE PRP APPL 2/3 (GAUZE/BANDAGES/DRESSINGS) ×2 IMPLANT
BLADE SURG 15 STRL LF DISP TIS (BLADE) ×1 IMPLANT
BLADE SURG 15 STRL SS (BLADE) ×1
BLADE SURG ROTATE 9660 (MISCELLANEOUS) IMPLANT
BUR PRESCISION 1.7 ELITE (BURR) ×2 IMPLANT
BUR ROUND PRECISION 4.0 (BURR) IMPLANT
BUR SABER RD CUTTING 3.0 (BURR) ×2 IMPLANT
CAGE BULLET CONCORDE 9X9X27 (Cage) ×2 IMPLANT
CAGE CONCORDE BULLET 9X7X27 (Cage) ×2 IMPLANT
CARTRIDGE OIL MAESTRO DRILL (MISCELLANEOUS) ×1 IMPLANT
CONT SPEC STER OR (MISCELLANEOUS) IMPLANT
COVER MAYO STAND STRL (DRAPES) ×6 IMPLANT
COVER SURGICAL LIGHT HANDLE (MISCELLANEOUS) ×2 IMPLANT
DIFFUSER DRILL AIR PNEUMATIC (MISCELLANEOUS) ×2 IMPLANT
DRAIN CHANNEL 15F RND FF W/TCR (WOUND CARE) IMPLANT
DRAPE C-ARM 42X72 X-RAY (DRAPES) ×2 IMPLANT
DRAPE C-ARMOR (DRAPES) IMPLANT
DRAPE ORTHO SPLIT 77X108 STRL (DRAPES)
DRAPE POUCH INSTRU U-SHP 10X18 (DRAPES) ×2 IMPLANT
DRAPE SURG 17X23 STRL (DRAPES) ×8 IMPLANT
DRAPE SURG ORHT 6 SPLT 77X108 (DRAPES) IMPLANT
DRSG MEPILEX BORDER 4X12 (GAUZE/BANDAGES/DRESSINGS) IMPLANT
DRSG MEPILEX BORDER 4X8 (GAUZE/BANDAGES/DRESSINGS) IMPLANT
DURAPREP 26ML APPLICATOR (WOUND CARE) ×2 IMPLANT
ELECT BLADE 4.0 EZ CLEAN MEGAD (MISCELLANEOUS) ×2
ELECT CAUTERY BLADE 6.4 (BLADE) ×2 IMPLANT
ELECT REM PT RETURN 9FT ADLT (ELECTROSURGICAL) ×2
ELECTRODE BLDE 4.0 EZ CLN MEGD (MISCELLANEOUS) ×1 IMPLANT
ELECTRODE REM PT RTRN 9FT ADLT (ELECTROSURGICAL) ×1 IMPLANT
EVACUATOR SILICONE 100CC (DRAIN) IMPLANT
GAUZE SPONGE 4X4 12PLY STRL (GAUZE/BANDAGES/DRESSINGS) ×2 IMPLANT
GAUZE SPONGE 4X4 16PLY XRAY LF (GAUZE/BANDAGES/DRESSINGS) IMPLANT
GLOVE BIO SURGEON STRL SZ7 (GLOVE) ×4 IMPLANT
GLOVE BIO SURGEON STRL SZ8 (GLOVE) ×2 IMPLANT
GLOVE BIOGEL PI IND STRL 7.0 (GLOVE) ×1 IMPLANT
GLOVE BIOGEL PI IND STRL 8 (GLOVE) ×1 IMPLANT
GLOVE BIOGEL PI INDICATOR 7.0 (GLOVE) ×1
GLOVE BIOGEL PI INDICATOR 8 (GLOVE) ×1
GOWN STRL REUS W/ TWL LRG LVL3 (GOWN DISPOSABLE) ×2 IMPLANT
GOWN STRL REUS W/ TWL XL LVL3 (GOWN DISPOSABLE) ×1 IMPLANT
GOWN STRL REUS W/TWL LRG LVL3 (GOWN DISPOSABLE) ×2
GOWN STRL REUS W/TWL XL LVL3 (GOWN DISPOSABLE) ×1
IV CATH 14GX2 1/4 (CATHETERS) ×2 IMPLANT
KIT BASIN OR (CUSTOM PROCEDURE TRAY) ×2 IMPLANT
KIT POSITION SURG JACKSON T1 (MISCELLANEOUS) ×2 IMPLANT
KIT ROOM TURNOVER OR (KITS) ×2 IMPLANT
MARKER SKIN DUAL TIP RULER LAB (MISCELLANEOUS) ×2 IMPLANT
MILL MEDIUM DISP (BLADE) ×2 IMPLANT
MIX DBX 10CC 35% BONE (Bone Implant) ×2 IMPLANT
NEEDLE 22X1 1/2 (OR ONLY) (NEEDLE) ×2 IMPLANT
NEEDLE BONE MARROW 8GX6 FENEST (NEEDLE) IMPLANT
NEEDLE HYPO 25GX1X1/2 BEV (NEEDLE) ×2 IMPLANT
NEEDLE SPNL 18GX3.5 QUINCKE PK (NEEDLE) ×4 IMPLANT
NEURO MONITORING STIM (LABOR (TRAVEL & OVERTIME)) ×2 IMPLANT
NS IRRIG 1000ML POUR BTL (IV SOLUTION) ×8 IMPLANT
OIL CARTRIDGE MAESTRO DRILL (MISCELLANEOUS) ×2
PACK LAMINECTOMY ORTHO (CUSTOM PROCEDURE TRAY) ×2 IMPLANT
PACK UNIVERSAL I (CUSTOM PROCEDURE TRAY) ×2 IMPLANT
PAD ARMBOARD 7.5X6 YLW CONV (MISCELLANEOUS) ×4 IMPLANT
PATTIES SURGICAL .5 X1 (DISPOSABLE) ×2 IMPLANT
PATTIES SURGICAL .5 X3 (DISPOSABLE) IMPLANT
PATTIES SURGICAL .5X1.5 (GAUZE/BANDAGES/DRESSINGS) IMPLANT
PATTIES SURGICAL .75X.75 (GAUZE/BANDAGES/DRESSINGS) IMPLANT
ROD EXPEDIUM PER BENT 65MM (Rod) ×4 IMPLANT
SCREW CORTICAL VIPER 7X40MM (Screw) ×4 IMPLANT
SCREW SET SINGLE INNER (Screw) ×12 IMPLANT
SCREW VIPER CORT FIX 6X35 (Screw) ×6 IMPLANT
SCREW VIPER CORTICAL FIX 6X40 (Screw) ×2 IMPLANT
SPONGE INTESTINAL PEANUT (DISPOSABLE) IMPLANT
SPONGE LAP 4X18 X RAY DECT (DISPOSABLE) ×2 IMPLANT
SPONGE SURGIFOAM ABS GEL 100 (HEMOSTASIS) ×2 IMPLANT
STRIP CLOSURE SKIN 1/2X4 (GAUZE/BANDAGES/DRESSINGS) ×2 IMPLANT
SURGIFLO TRUKIT (HEMOSTASIS) IMPLANT
SUT MNCRL AB 4-0 PS2 18 (SUTURE) ×2 IMPLANT
SUT PROLENE 6 0 C 1 24 (SUTURE) IMPLANT
SUT VIC AB 0 CT1 18XCR BRD 8 (SUTURE) ×1 IMPLANT
SUT VIC AB 0 CT1 8-18 (SUTURE) ×1
SUT VIC AB 1 CT1 18XCR BRD 8 (SUTURE) ×1 IMPLANT
SUT VIC AB 1 CT1 8-18 (SUTURE) ×1
SUT VIC AB 2-0 CT2 18 VCP726D (SUTURE) ×2 IMPLANT
SYR 20CC LL (SYRINGE) ×2 IMPLANT
SYR BULB IRRIGATION 50ML (SYRINGE) ×2 IMPLANT
SYR CONTROL 10ML LL (SYRINGE) ×2 IMPLANT
SYR TB 1ML LUER SLIP (SYRINGE) ×2 IMPLANT
TOWEL OR 17X24 6PK STRL BLUE (TOWEL DISPOSABLE) ×2 IMPLANT
TOWEL OR 17X26 10 PK STRL BLUE (TOWEL DISPOSABLE) ×2 IMPLANT
TRAY FOLEY CATH 16FRSI W/METER (SET/KITS/TRAYS/PACK) ×2 IMPLANT
YANKAUER SUCT BULB TIP NO VENT (SUCTIONS) ×2 IMPLANT

## 2015-01-08 NOTE — Anesthesia Preprocedure Evaluation (Addendum)
Anesthesia Evaluation  Patient identified by MRN, date of birth, ID band Patient awake    Reviewed: Allergy & Precautions, NPO status , Patient's Chart, lab work & pertinent test results  History of Anesthesia Complications (+) PONV  Airway Mallampati: II  TM Distance: >3 FB Neck ROM: Full    Dental   Pulmonary neg pulmonary ROS,  breath sounds clear to auscultation        Cardiovascular + angina Rhythm:Regular Rate:Normal     Neuro/Psych    GI/Hepatic Neg liver ROS, GERD-  ,  Endo/Other  negative endocrine ROS  Renal/GU negative Renal ROS     Musculoskeletal   Abdominal   Peds  Hematology   Anesthesia Other Findings   Reproductive/Obstetrics                             Anesthesia Physical Anesthesia Plan  ASA: II  Anesthesia Plan: General   Post-op Pain Management:    Induction: Intravenous  Airway Management Planned: Oral ETT  Additional Equipment:   Intra-op Plan:   Post-operative Plan: Extubation in OR  Informed Consent: I have reviewed the patients History and Physical, chart, labs and discussed the procedure including the risks, benefits and alternatives for the proposed anesthesia with the patient or authorized representative who has indicated his/her understanding and acceptance.   Dental advisory given  Plan Discussed with: CRNA, Anesthesiologist and Surgeon  Anesthesia Plan Comments:        Anesthesia Quick Evaluation

## 2015-01-08 NOTE — Anesthesia Procedure Notes (Signed)
Procedure Name: Intubation Date/Time: 01/08/2015 12:50 PM Performed by: Charm Barges, Kyesha Balla R Pre-anesthesia Checklist: Patient identified, Emergency Drugs available, Suction available, Patient being monitored and Timeout performed Patient Re-evaluated:Patient Re-evaluated prior to inductionOxygen Delivery Method: Circle system utilized Preoxygenation: Pre-oxygenation with 100% oxygen Intubation Type: IV induction Ventilation: Mask ventilation without difficulty Laryngoscope Size: 3 and Mac Grade View: Grade II Tube type: Oral Tube size: 7.5 mm Number of attempts: 1 Airway Equipment and Method: Stylet Placement Confirmation: ETT inserted through vocal cords under direct vision,  positive ETCO2 and breath sounds checked- equal and bilateral Secured at: 21 cm Tube secured with: Tape Dental Injury: Teeth and Oropharynx as per pre-operative assessment

## 2015-01-08 NOTE — Transfer of Care (Signed)
Immediate Anesthesia Transfer of Care Note  Patient: KEYMORA GRILLOT  Procedure(s) Performed: Procedure(s) with comments: POSTERIOR LUMBAR FUSION 2 LEVEL (Left) - Left sided lumbar 4-5, lumbar 5-sacrum 1 Transforaminal lumbar interbody fusion with instrumentation and allograft  Patient Location: PACU  Anesthesia Type:General  Level of Consciousness: awake, alert , oriented and patient cooperative  Airway & Oxygen Therapy: Patient Spontanous Breathing and Patient connected to nasal cannula oxygen  Post-op Assessment: Report given to RN, Post -op Vital signs reviewed and stable, Patient moving all extremities and Patient moving all extremities X 4  Post vital signs: Reviewed and stable  Last Vitals:  Filed Vitals:   01/08/15 0953  BP: 96/58  Pulse: 91  Temp: 36.9 C  Resp: 18    Complications: No apparent anesthesia complications

## 2015-01-09 MED ORDER — OXYCODONE HCL ER 10 MG PO T12A: 20.0000 mg | EXTENDED_RELEASE_TABLET | Freq: Two times a day (BID) | ORAL | Status: DC

## 2015-01-09 MED FILL — Thrombin For Soln 20000 Unit: CUTANEOUS | Qty: 1 | Status: AC

## 2015-01-09 NOTE — Progress Notes (Signed)
    Patient doing well Patient denies leg pain + minimal LBP   Physical Exam: Filed Vitals:   01/09/15 0512  BP: 87/52  Pulse: 76  Temp: 98.2 F (36.8 C)  Resp: 15    Patient looks very comfortable Dressing in place NVI  POD #1 s/p L4-S1 decompression and fusion, doing very well  - up with PT/OT, encourage ambulation - Percocet for pain, Valium for muscle spasms - likely d/c home Saturday

## 2015-01-09 NOTE — Evaluation (Signed)
Physical Therapy Evaluation Patient Details Name: Donna Marsh MRN: [id number] DOB: 01/24/1956 Today's Date: 01/09/2015   History of Present Illness  Patient is a 59 y/o female s/p L4-S1 decompression and fusion. PMH includes anxiety, depression, alopecia universalis.  Clinical Impression  Patient presents with pain and mild balance deficits s/p back surgery. Education provided on back precautions and how to donn TLSO with hip component. Will need to educate family on how to assist with donning/doffing brace for home. Tolerated ambulation with Min guard assist for safety. Will plan for stair negotiation next session to prepare pt for home. Will continue to follow to maximize independence and mobility.     Follow Up Recommendations Supervision - Intermittent;No PT follow up    Equipment Recommendations  None recommended by PT    Recommendations for Other Services       Precautions / Restrictions Precautions Precautions: Back Precaution Booklet Issued: No Precaution Comments: Reviewed precautions.  Required Braces or Orthoses: Spinal Brace Spinal Brace: Thoracolumbosacral orthotic;Applied in sitting position (or standing position. On at all times OOB except night time trips to bathroom.) Restrictions Weight Bearing Restrictions: No      Mobility  Bed Mobility Overal bed mobility: Needs Assistance Bed Mobility: Supine to Sit     Supine to sit: Supervision;HOB elevated     General bed mobility comments: Pt sat straight up prior to therapist's cues to perform log roll technique.   Transfers Overall transfer level: Needs assistance Equipment used: Rolling walker (2 wheeled);None Transfers: Sit to/from Stand Sit to Stand: Min guard         General transfer comment: Min guard for safety. Stood from [employer], from toilet x1. Transferred to chair.   Ambulation/Gait Ambulation/Gait assistance: Min guard Ambulation Distance (Feet): 100 Feet Assistive device: Rolling  walker (2 wheeled) Gait Pattern/deviations: Step-through pattern;Decreased stride length   Gait velocity interpretation: Below normal speed for age/gender General Gait Details: Slow, mildly unsteady gait with cues to keep UEs on RW. Ambulated within room without RW.   Stairs            Wheelchair Mobility    Modified Rankin (Stroke Patients Only)       Balance Overall balance assessment: Needs assistance Sitting-balance support: Feet supported;No upper extremity supported Sitting balance-Leahy Scale: Good Sitting balance - Comments: ABle to adjust socks by bringing LEs onto knee without difficulty. Donned TLSO in sitting/standing position.    Standing balance support: During functional activity Standing balance-Leahy Scale: Fair                               Pertinent Vitals/Pain Pain Assessment: 0-10 Pain Score: 9  Pain Location: back- surgical site Pain Descriptors / Indicators: Sore;Aching Pain Intervention(s): Monitored during session;Repositioned    Home Living Family/patient expects to be discharged to:: Private residence Living Arrangements: Spouse/significant other Available Help at Discharge: Family;Available PRN/intermittently Type of Home: House Home Access: Stairs to enter Entrance Stairs-Rails: None Entrance Stairs-Number of Steps: 3 Home Layout: One level Home Equipment: Cane - single point Additional Comments: Can borrow a RW if needed.    Prior Function Level of Independence: Independent               Hand Dominance        Extremity/Trunk Assessment   Upper Extremity Assessment: Defer to OT evaluation           Lower Extremity Assessment: Generalized weakness  Communication   Communication: No difficulties  Cognition Arousal/Alertness: Awake/alert Behavior During Therapy: WFL for tasks assessed/performed Overall Cognitive Status: Within Functional Limits for tasks assessed                       General Comments General comments (skin integrity, edema, etc.): Husband present in room towards end of PT evaluation.    Exercises        Assessment/Plan    PT Assessment Patient needs continued PT services  PT Diagnosis Difficulty walking;Generalized weakness;Acute pain   PT Problem List Decreased strength;Pain;Decreased balance;Decreased mobility;Decreased knowledge of precautions;Decreased knowledge of use of DME  PT Treatment Interventions Balance training;Gait training;Functional mobility training;Therapeutic activities;Therapeutic exercise;Patient/family education;Stair training   PT Goals (Current goals can be found in the Care Plan section) Acute Rehab PT Goals Patient Stated Goal: to go home  PT Goal Formulation: With patient Time For Goal Achievement: 01/23/15 Potential to Achieve Goals: Good    Frequency Min 5X/week   Barriers to discharge        Co-evaluation               End of Session Equipment Utilized During Treatment: Gait belt;Back brace Activity Tolerance: Patient tolerated treatment well Patient left: in chair;with call bell/phone within reach;with family/visitor present Nurse Communication: Mobility status         Time: 1107-1130 PT Time Calculation (min) (ACUTE ONLY): 23 min   Charges:   PT Evaluation $Initial PT Evaluation Tier I: 1 Procedure PT Treatments $Gait Training: 8-22 mins   PT G Codes:        Tra Wilemon A Aveena Bari 01/09/2015, 12:06 PM  Mylo Red, PT, DPT [phone]

## 2015-01-09 NOTE — Evaluation (Signed)
Occupational Therapy Evaluation and Discharge Patient Details Name: Donna Marsh MRN: [id number] DOB: 02-20-1956 Today's Date: 01/09/2015    History of Present Illness Patient is a 59 y/o female s/p L4-S1 decompression and fusion. PMH includes anxiety, depression, alopecia universalis.   Clinical Impression   This 59 yo female admitted and underwent above presents to acute OT with all education completed, we will D/C from acute OT.    Follow Up Recommendations  No OT follow up    Equipment Recommendations  3 in 1 bedside comode       Precautions / Restrictions Precautions Precautions: Back Precaution Booklet Issued: No Precaution Comments: Reviewed precautions.  Required Braces or Orthoses: Spinal Brace Spinal Brace: Thoracolumbosacral orthotic;Applied in sitting position Restrictions Weight Bearing Restrictions: No      Mobility Bed Mobility     General bed mobility comments: Pt up in recliner upon my arrival  Transfers Overall transfer level: Needs assistance Equipment used: None Transfers: Sit to/from Stand Sit to Stand: Supervision            Balance Overall balance assessment: Needs assistance Sitting-balance support: Feet supported;No upper extremity supported Sitting balance-Leahy Scale: Good Sitting balance - Comments: ABle to adjust socks by bringing LEs onto knee without difficulty. Donned TLSO in sitting/standing position.    Standing balance support: During functional activity Standing balance-Leahy Scale: Fair                              ADL                                         General ADL Comments: Pt's husband and son will A her with LB ADLs as she needs it. I had husband and son doff and donn pt's TLSO with leg component while she was standing by only removing straps on the right hand side. I discussed with her using wet wipes for back peri care and two cups for brushing teeth (one to rinse and one to  spit so she does not have to bend over the sink). I went over how she should side step into the tub.     Vision Additional Comments: No change from baseline          Pertinent Vitals/Pain Pain Assessment: 0-10 Pain Score: 6 Pain Location: back- surgical site Pain Descriptors / Indicators: Sore;Aching Pain Intervention(s): Monitored during session;Repositioned     Hand Dominance  right   Extremity/Trunk Assessment Upper Extremity Assessment Upper Extremity Assessment: Overall WFL for tasks assessed   Lower Extremity Assessment Lower Extremity Assessment: Generalized weakness       Communication Communication Communication: No difficulties   Cognition Arousal/Alertness: Awake/alert Behavior During Therapy: WFL for tasks assessed/performed Overall Cognitive Status: Within Functional Limits for tasks assessed                                Home Living Family/patient expects to be discharged to:: Private residence Living Arrangements: Spouse/significant other Available Help at Discharge: Family;Available PRN/intermittently Type of Home: House Home Access: Stairs to enter [employer] of Steps: 3 Entrance Stairs-Rails: None Home Layout: One level               Home Equipment: Cane - single point   Additional Comments: Can borrow a RW  if needed.      Prior Functioning/Environment Level of Independence: Independent             OT Diagnosis: Generalized weakness;Acute pain         OT Goals(Current goals can be found in the care plan section) Acute Rehab OT Goals Patient Stated Goal: to go home   OT Frequency:                End of Session Equipment Utilized During Treatment: Back brace  Activity Tolerance: Patient tolerated treatment well Patient left: in chair;with call bell/phone within reach   Time: 1135-1221 OT Time Calculation (min): 46 min Charges:  OT General Charges $OT Visit: 1 Procedure OT  Evaluation $Initial OT Evaluation Tier I: 1 Procedure OT Treatments $Self Care/Home Management : 23-37 mins  Evette Georges [phone] 01/09/2015, 3:38 PM

## 2015-01-09 NOTE — Op Note (Signed)
NAMEBRENNLEY, CURTICE           ACCOUNT NO.:  [id number]  MEDICAL RECORD NO.:  [id number]  LOCATION:  5N07C                        FACILITY:  [facility]  PHYSICIAN:  Estill Bamberg, MD      DATE OF BIRTH:  Oct 23, 1955  DATE OF PROCEDURE:  01/08/2015                              OPERATIVE REPORT   PREOPERATIVE DIAGNOSIS: 1. Spinal stenosis L4-5, L5-S1. 2. Bilateral leg pain. 3. Axial low back pain secondary to facet arthrosis L4-5, L5-S1.  POSTOPERATIVE DIAGNOSIS: 1. Spinal stenosis L4-5, L5-S1. 2. Bilateral leg pain. 3. Axial low back pain secondary to facet arthrosis L4-5, L5-S1.  PROCEDURE: 1. Left-sided transforaminal lumbar interbody fusion L4-5, L5-S1. 2. Right-sided posterolateral fusion L4-5, L5-S1. 3. Lumbar decompression with bilateral partial facetectomy L4-L5, L5-     S1, requiring more bone and soft tissue removal than that of which     is required for the fusion portion of the procedure. 4. Placement of posterior segmental instrumentation L4, L5, S1 (6-mm,     7-mm cortical pedicle screws). 5. Insertion of interbody device x2 (7 x 28 mm intervertebral cage at     L5-S1, 9 x 27 mm intervertebral cage at L4-5). 6. Use of local autograft. 7. Use of morselized allograft. 8. Intraoperative use of fluoroscopy.  SURGEON:  Estill Bamberg, MD  ASSISTANT:  Jason Coop, PA-C.  ANESTHESIA:  General endotracheal anesthesia.  COMPLICATIONS:  None.  DISPOSITION:  Stable.  ESTIMATED BLOOD LOSS:  200 mL.  INDICATIONS FOR SURGERY:  Briefly, Ms. Donna Marsh is a very pleasant 35- year-old female, who was injured at work on February 04, 2014.  She did initially present to me on September 01, 2014, with low back pain in addition to bilateral leg pain.  We did go forward with multiple conservative treatment options, which did include facet injections at L4- 5 and L5-S1.  She did get excellent temporary relief with her facet injections, however, was deemed to not be a candidate for  a radiofrequency ablation procedure.  She did also have spinal stenosis and bilateral leg pain, however, her symptoms did not improve with epidural injections.  Given her ongoing pain, we did discuss proceeding with the procedure reflected above.  The patient was fully aware of the risks and limitations of the procedure, including the possibility of lack of resolution of her symptoms.  OPERATIVE DETAILS:  On January 08, 2015, the patient was brought to surgery and general endotracheal anesthesia was administered.  The patient was placed prone on a well-padded flat Jackson bed with a spinal frame. Antibiotics were given.  All bony prominences were meticulously padded. The back was prepped and draped and a time-out was performed.  I then made a 6-cm midline incision.  The fascia was incised in the midline. The paraspinal musculature was bluntly swept laterally.  The laminae of L4, L5, and S1 were identified and subperiosteally exposed.  Using anatomic landmarks in addition to AP and lateral fluoroscopy, I did use a 1.7-mm bur to define the entry points of the L4 and L5 and S1 pedicles.  I did use a medial to lateral cortical trajectory technique. I did tap up to a 7-mm tap.  On the right side, the L4-5 and L5-S1 facet  joints were decorticated, as was the posterolateral gutter.  I then placed 6 x 35 mm screws on the right at L4 and L5 and a 7 x 40 mm screw on the right at S1.  A 65-mm rod was secured into the Tulip heads of the screws.  The caps were placed and distraction was applied across the L4- 5 and L5-S1 intervertebral spaces.  The caps were then provisionally tightened.  On the left side, I did place bone wax in the cannulated pedicles.  I then proceeded with a central and bilateral lateral recess decompression starting at L5-S1, and carrying the decompression up to the L4-5 level.  There was moderate stenosis noted bilaterally.  I was, however, able to thoroughly remove the nerve  compression with the decompression.  I was very pleased with the decompression that I was able to accomplish.  Then on the left side, I performed a full facetectomy.  With assistant holding medial retraction of the traversing left S1 nerve, I did use a 15-blade knife to perform an annulotomy.  I then performed a standard diskectomy.  After adequate preparation of the endplates, the intervertebral space was packed with DBX mix in addition to autograft from the decompression.  A 7 x 27 mm spacer was also packed with autograft and allograft and tamped into position in the intervertebral space.  I was pleased with the press-fit of the implant. I then turned my attention to the L4-5 level.  Again, a full facetectomy was performed, and again, the traversing L5 nerve was medially retracted.  I then performed an annulotomy and again, a thorough intervertebral diskectomy was performed.  After adequate preparation of the endplates, the intervertebral space was packed with autograft and allograft, as was a 9 mm x 27 mm intervertebral spacer.  The intervertebral spacer was then tamped into position.  Of note, I did copiously irrigate the wound with approximately 2 L of normal saline prior to placing the bone graft.  The posterior elements on the right and along the posterolateral gutter on the right, I did also pack autograft and allograft to help aid in the success of the fusion.  On the left side, 6-mm screws were placed at L4 and at L5, and a 7-mm screw was placed at S1.  Again, a 65-mm rod was secured into the Tulip heads of the screws.  Distraction was then discontinued on the contralateral right side.  All caps were then tightened and a final locking procedure was performed.  I was very pleased with the final AP and lateral fluoroscopic images.  All bleeding was adequately controlled at the termination of the procedure.  I then closed the fascia using #1 Vicryl. The subcutaneous layer was  closed using 2-0 Vicryl, and the skin was closed using 3-0 Monocryl.  Benzoin and Steri-Strips were applied followed by sterile dressing.  All instrument counts were correct at the termination of the procedure.  Of note, Jason Coop, was my assistant throughout the surgery, and did aid in retraction, suctioning, and closure.     Estill Bamberg, MD     MD/MEDQ  D:  01/08/2015  T:  01/09/2015  Job:  [id number]  cc:   Creig Hines, Dr.

## 2015-01-09 NOTE — Care Management (Signed)
Utilization review completed by [place] Brogden N. Chamberlain Steinborn, RN BSN 

## 2015-01-09 NOTE — Progress Notes (Signed)
Patient decided that she would like a 3N1 which was recommended by OT. Contacted workers comp case manager Wylie Hail and faxed order and op note to [phone]. Received confirmation, Olegario Messier stated that she would tey to get approval from the adjustor and have the 3N1 delivered to patient's home.

## 2015-01-09 NOTE — Care Management Note (Signed)
Case Management Note  Patient Details  Name: Donna Marsh MRN: [id number] Date of Birth: [birth year]  Subjective/Objective:         S/p L4-S1 decompression and fusion           Action/Plan: PT/OT evals- no follow up therapy or equipment needs identified. Spoke with patient, workers comp Sports coach is Donna Marsh [phone]. Will contact Ms Donna Marsh if any d/c needs identified.    Expected Discharge Date:                  Expected Discharge Plan:  Home/Self Care  In-House Referral:  NA  Discharge planning Services  CM Consult  Post Acute Care Choice:  NA Choice offered to:  NA  DME Arranged:    DME Agency:     HH Arranged:    HH Agency:     Status of Service:  Completed, signed off  Medicare Important Message Given:    Date Medicare IM Given:    Medicare IM give by:    Date Additional Medicare IM Given:    Additional Medicare Important Message give by:     If discussed at Long Length of Stay Meetings, dates discussed:    Additional Comments:  Donna Becton, RN 01/09/2015, 1:27 PM

## 2015-01-10 NOTE — Progress Notes (Signed)
Physical Therapy Treatment Patient Details Name: Donna Marsh MRN: [id number] DOB: Aug 06, 1955 Today's Date: 01/10/2015    History of Present Illness Patient is a 59 y/o female s/p L4-S1 decompression and fusion. PMH includes anxiety, depression, alopecia universalis.    PT Comments    Pt progressing well with mobility.  Increased ambulation distance & completed stair training this session.  Patient safe to D/C from a mobility standpoint based on progression towards goals set on PT eval.    Follow Up Recommendations  Supervision - Intermittent;No PT follow up     Equipment Recommendations  None recommended by PT    Recommendations for Other Services       Precautions / Restrictions Precautions Precautions: Back Precaution Comments: Reviewed precautions.  Required Braces or Orthoses: Spinal Brace Spinal Brace: Thoracolumbosacral orthotic;Applied in sitting position (leg component) Restrictions Weight Bearing Restrictions: No    Mobility  Bed Mobility Overal bed mobility: Needs Assistance Bed Mobility: Rolling;Sidelying to Sit Rolling: Supervision Sidelying to sit: Min assist       General bed mobility comments: cues to reinforce back precautions.  (A) to lift shoulders/trunk to sitting upright.   Transfers Overall transfer level: Modified independent Equipment used: Rolling walker (2 wheeled) Transfers: Sit to/from Stand              Ambulation/Gait Ambulation/Gait assistance: Min guard Ambulation Distance (Feet): 200 Feet Assistive device: Rolling walker (2 wheeled) Gait Pattern/deviations: Step-through pattern;Decreased stride length Gait velocity: decreased   General Gait Details: slow gait but steady.  Guarding for safety due to pt reports not feeling as steady due to just receiving pain medication   Stairs Stairs: Yes Stairs assistance: Min guard Stair Management: Two rails;Step to pattern;Forwards Number of Stairs: 2 (3x's) General stair  comments: cues for sequencing & technique  Wheelchair Mobility    Modified Rankin (Stroke Patients Only)       Balance                                    Cognition Arousal/Alertness: Awake/alert Behavior During Therapy: WFL for tasks assessed/performed Overall Cognitive Status: Within Functional Limits for tasks assessed                      Exercises      General Comments        Pertinent Vitals/Pain Pain Assessment: 0-10 Pain Score: 3  Pain Location: surgical site Pain Descriptors / Indicators: Discomfort Pain Intervention(s): Monitored during session;Premedicated before session;Repositioned    Home Living                      Prior Function            PT Goals (current goals can now be found in the care plan section) Acute Rehab PT Goals Patient Stated Goal: to go home  PT Goal Formulation: With patient Time For Goal Achievement: 01/23/15 Potential to Achieve Goals: Good Progress towards PT goals: Progressing toward goals    Frequency  Min 5X/week    PT Plan Current plan remains appropriate    Co-evaluation             End of Session Equipment Utilized During Treatment: Back brace Activity Tolerance: Patient tolerated treatment well Patient left: in chair;with call bell/phone within reach     Time: 1006-1020 PT Time Calculation (min) (ACUTE ONLY): 14 min  Charges:  $Gait Training:  8-22 mins                    G Codes:      Lara Mulch 01/10/2015, 11:57 AM   Verdell Face, PTA [phone] 01/10/2015

## 2015-01-10 NOTE — Progress Notes (Signed)
Subjective: 2 Days Post-Op Procedure(s) (LRB): POSTERIOR LUMBAR FUSION 2 LEVEL (Left) Patient reports pain as moderate. Denies leg pain. Taking by mouth without difficulty, voiding well. Positive flatus. Progressing with physical therapy.    Objective: Vital signs in last 24 hours: Temp:  [98.3 F (36.8 C)-98.7 F (37.1 C)] 98.4 F (36.9 C) (06/18 0517) Pulse Rate:  [74-99] 99 (06/18 0517) Resp:  [16-17] 16 (06/18 0517) BP: (91-98)/(56-62) 91/56 mmHg (06/18 0517) SpO2:  [95 %-98 %] 95 % (06/18 0517)  Intake/Output from previous day: 06/17 0701 - 06/18 0700 In: 1048.8 [P.O.:840; I.V.:208.8] Out: 850 [Urine:850] Intake/Output this shift:    Lumbar spine exam: Lumbar dressing clean and dry. No swelling or redness. Bilateral lower extremities have good sensation and good motor strength. Bilateral calves are soft and nontender.  Assessment/Plan: 2 Days Post-Op Procedure(s) (LRB): POSTERIOR LUMBAR FUSION 2 LEVEL (Left) Plan: Discharge home. She will need follow-up with Dr. Yevette Edwards  in 10 days. Prescriptions given. She has good understanding on how to wear the TLSO brace.   Abdulkareem Badolato G 01/10/2015, 8:45 AM

## 2015-01-12 MED FILL — Heparin Sodium (Porcine) Inj 1000 Unit/ML: INTRAMUSCULAR | Qty: 30 | Status: AC

## 2015-01-12 MED FILL — Sodium Chloride IV Soln 0.9%: INTRAVENOUS | Qty: 3000 | Status: AC

## 2015-01-20 NOTE — Anesthesia Postprocedure Evaluation (Signed)
Anesthesia Post Note  Patient: Donna Marsh  Procedure(s) Performed: Procedure(s) (LRB): POSTERIOR LUMBAR FUSION 2 LEVEL (Left)  Anesthesia type: General  Patient location: PACU  Post pain: Pain level controlled and Adequate analgesia  Post assessment: Post-op Vital signs reviewed, Patient's Cardiovascular Status Stable, Respiratory Function Stable, Patent Airway and Pain level controlled  Last Vitals:  Filed Vitals:   01/10/15 0517  BP: 91/56  Pulse: 99  Temp: 36.9 C  Resp: 16    Post vital signs: Reviewed and stable  Level of consciousness: awake, alert  and oriented  Complications: No apparent anesthesia complications

## 2015-02-18 NOTE — Discharge Summary (Signed)
Patient ID: Donna Marsh MRN: [id number] DOB/AGE: 59-May-1957 59 y.o.  Admit date: 01/08/2015 Discharge date: 01/10/2015  Admission Diagnoses:  Active Problems:  Spinal stenosis   Discharge Diagnoses:  Same  Past Medical History  Diagnosis Date  . Headache(784.0)     HX OF MIGRAINES  . Anxiety   . Depression   . Hypercholesterolemia   . Varicose veins     LEFT LEG  . UTI (lower urinary tract infection)     FREQUENTLY--JUST FINISHED ANTIBIOTIC SAT 06/25/11 FOR UTI  . URI (upper respiratory infection)     URI IN OCT 2012--PT FEELS CLEAR NOW--DOES NOT USUALLY HAVE ANY PROBLEMS WITH URI  . Arthritis     RA AND OA .   Marland Kitchen Edema     HX OF SWELLING OF FEET, LEGS, HANDS AND FACE--TAKES TRIAMTERENE/HCTZ FOR PREVENTION--DOES NOT HAVE HYPERTENSION AND HAS BEEN TOLD B/P USUALLY LOW  . Alopecia universalis   . Blood transfusion 1979    WITH PREGNANCY  . GERD (gastroesophageal reflux disease)     OCCAS--OTC MED IF ANYTHING NEEDED  . PONV (postoperative nausea and vomiting)   . Anemia     in 1979  . Anginal pain     hx of CP in 2013, went to the hospital, everything was negative    Surgeries: Procedure(s): POSTERIOR LUMBAR FUSION 2 LEVEL L4-S1 on 01/08/2015  Consultants:  None  Discharged Condition: Improved  Hospital Course: Donna Marsh is an 59 y.o. female who was admitted 01/08/2015 for operative treatment of radiculopathy. Patient has severe unremitting pain that affects sleep, daily activities, and work/hobbies. After pre-op clearance the patient was taken to the operating room on 01/08/2015 and underwent Procedure(s): POSTERIOR LUMBAR FUSION 2 LEVEL L4-S1.   Patient was given perioperative antibiotics:  Anti-infectives    Start   Dose/Rate Route Frequency Ordered Stop   01/08/15 1930  ceFAZolin (ANCEF) IVPB 1 g/50 mL premix    1 g 100 mL/hr over 30  Minutes Intravenous Every 8 hours 01/08/15 1859 01/09/15 0544   01/08/15 1115  ceFAZolin (ANCEF) IVPB 2 g/50 mL premix    2 g 100 mL/hr over 30 Minutes Intravenous To ShortStay Surgical 01/07/15 1346 01/08/15 1245       Patient was given sequential compression devices, early ambulation to prevent DVT.  Patient benefited maximally from hospital stay and there were no complications.   Recent vital signs: BP 91/56 mmHg  Pulse 99  Temp(Src) 98.4 F (36.9 C) (Oral)  Resp 16  Ht 5\' 2"  (1.575 m)  Wt 71.578 kg (157 lb 12.8 oz)  BMI 28.85 kg/m2  SpO2 95%   Discharge Medications:    Medication List    TAKE these medications       ALPRAZolam 1 MG tablet  Commonly known as: XANAX  Take 1 mg by mouth 3 (three) times daily.     doxepin 25 MG capsule  Commonly known as: SINEQUAN  Take 100 mg by mouth at bedtime.     lansoprazole 30 MG capsule  Commonly known as: PREVACID  Take 30 mg by mouth 2 (two) times daily.     lubiprostone 24 MCG capsule  Commonly known as: AMITIZA  Take 24 mcg by mouth 2 (two) times daily with a meal.     pantoprazole 40 MG tablet  Commonly known as: PROTONIX  Take 40 mg by mouth daily.     spironolactone 50 MG tablet  Commonly known as: ALDACTONE  Take 150 mg by  mouth daily.     venlafaxine XR 75 MG 24 hr capsule  Commonly known as: EFFEXOR-XR  Take 75 mg by mouth every morning.        Diagnostic Studies:    Imaging Results    No results found.    Disposition: 01-Home or Self Care   POD #2 s/p L4-S1 decompression and fusion, doing very well  - up with PT/OT, encourage ambulation - Percocet for pain, Valium for muscle spasms -Written scripts for pain signed and in chart -D/C instructions sheet printed and in chart -D/C today  -F/U in office 2 weeks   Signed: Georga Bora 02/18/2015, 2:24 PM

## 2015-02-18 NOTE — Progress Notes (Deleted)
Patient ID: Donna Marsh MRN: [id number] DOB/AGE: 11/09/1955 59 y.o.  Admit date: 01/08/2015 Discharge date: 01/10/2015  Admission Diagnoses:  Active Problems:   Spinal stenosis   Discharge Diagnoses:  Same  Past Medical History  Diagnosis Date  . Headache(784.0)     HX OF MIGRAINES  . Anxiety   . Depression   . Hypercholesterolemia   . Varicose veins     LEFT LEG  . UTI (lower urinary tract infection)     FREQUENTLY--JUST FINISHED ANTIBIOTIC SAT 06/25/11 FOR UTI  . URI (upper respiratory infection)     URI IN OCT 2012--PT FEELS CLEAR NOW--DOES NOT USUALLY HAVE ANY PROBLEMS WITH URI  . Arthritis     RA AND OA .    Marland Kitchen Edema     HX OF SWELLING OF FEET, LEGS, HANDS AND FACE--TAKES TRIAMTERENE/HCTZ FOR PREVENTION--DOES NOT HAVE HYPERTENSION AND HAS BEEN TOLD B/P USUALLY LOW  . Alopecia universalis   . Blood transfusion 1979    WITH PREGNANCY  . GERD (gastroesophageal reflux disease)     OCCAS--OTC MED IF ANYTHING NEEDED  . PONV (postoperative nausea and vomiting)   . Anemia     in 1979  . Anginal pain     hx of CP in 2013, went to the hospital, everything was negative    Surgeries: Procedure(s): POSTERIOR LUMBAR FUSION 2 LEVEL L4-S1 on 01/08/2015   Consultants:  None  Discharged Condition: Improved  Hospital Course: SHIELA BRUNS is an 59 y.o. female who was admitted 01/08/2015 for operative treatment of radiculopathy. Patient has severe unremitting pain that affects sleep, daily activities, and work/hobbies. After pre-op clearance the patient was taken to the operating room on 01/08/2015 and underwent  Procedure(s): POSTERIOR LUMBAR FUSION 2 LEVEL L4-S1.    Patient was given perioperative antibiotics:  Anti-infectives    Start     Dose/Rate Route Frequency Ordered Stop   01/08/15 1930  ceFAZolin (ANCEF) IVPB 1 g/50 mL premix     1 g 100 mL/hr over 30 Minutes Intravenous Every 8 hours 01/08/15 1859 01/09/15 0544   01/08/15 1115  ceFAZolin (ANCEF)  IVPB 2 g/50 mL premix     2 g 100 mL/hr over 30 Minutes Intravenous To ShortStay Surgical 01/07/15 1346 01/08/15 1245       Patient was given sequential compression devices, early ambulation to prevent DVT.  Patient benefited maximally from hospital stay and there were no complications.    Recent vital signs: BP 91/56 mmHg  Pulse 99  Temp(Src) 98.4 F (36.9 C) (Oral)  Resp 16  Ht 5\' 2"  (1.575 m)  Wt 71.578 kg (157 lb 12.8 oz)  BMI 28.85 kg/m2  SpO2 95%   Discharge Medications:     Medication List    TAKE these medications        ALPRAZolam 1 MG tablet  Commonly known as:  XANAX  Take 1 mg by mouth 3 (three) times daily.     doxepin 25 MG capsule  Commonly known as:  SINEQUAN  Take 100 mg by mouth at bedtime.     lansoprazole 30 MG capsule  Commonly known as:  PREVACID  Take 30 mg by mouth 2 (two) times daily.     lubiprostone 24 MCG capsule  Commonly known as:  AMITIZA  Take 24 mcg by mouth 2 (two) times daily with a meal.     pantoprazole 40 MG tablet  Commonly known as:  PROTONIX  Take 40 mg by mouth daily.  spironolactone 50 MG tablet  Commonly known as:  ALDACTONE  Take 150 mg by mouth daily.     venlafaxine XR 75 MG 24 hr capsule  Commonly known as:  EFFEXOR-XR  Take 75 mg by mouth every morning.        Diagnostic Studies: No results found.  Disposition: 01-Home or Self Care   POD #2 s/p L4-S1 decompression and fusion, doing very well  - up with PT/OT, encourage ambulation - Percocet for pain, Valium for muscle spasms -Written scripts for pain signed and in chart -D/C instructions sheet printed and in chart -D/C today  -F/U in office 2 weeks   Signed: Georga Bora 02/18/2015, 2:24 PM

## 2015-08-23 IMAGING — CR DG LUMBAR SPINE 2-3V
2 series · 2 of 2 positions shown · non-contrast
Comparison: MRI 02/19/2014

CLINICAL DATA: L4-5/ L5-S1 PLIF LMP.

EXAM:
LUMBAR SPINE - 2-3 VIEW

[lateral (1 of 2)]
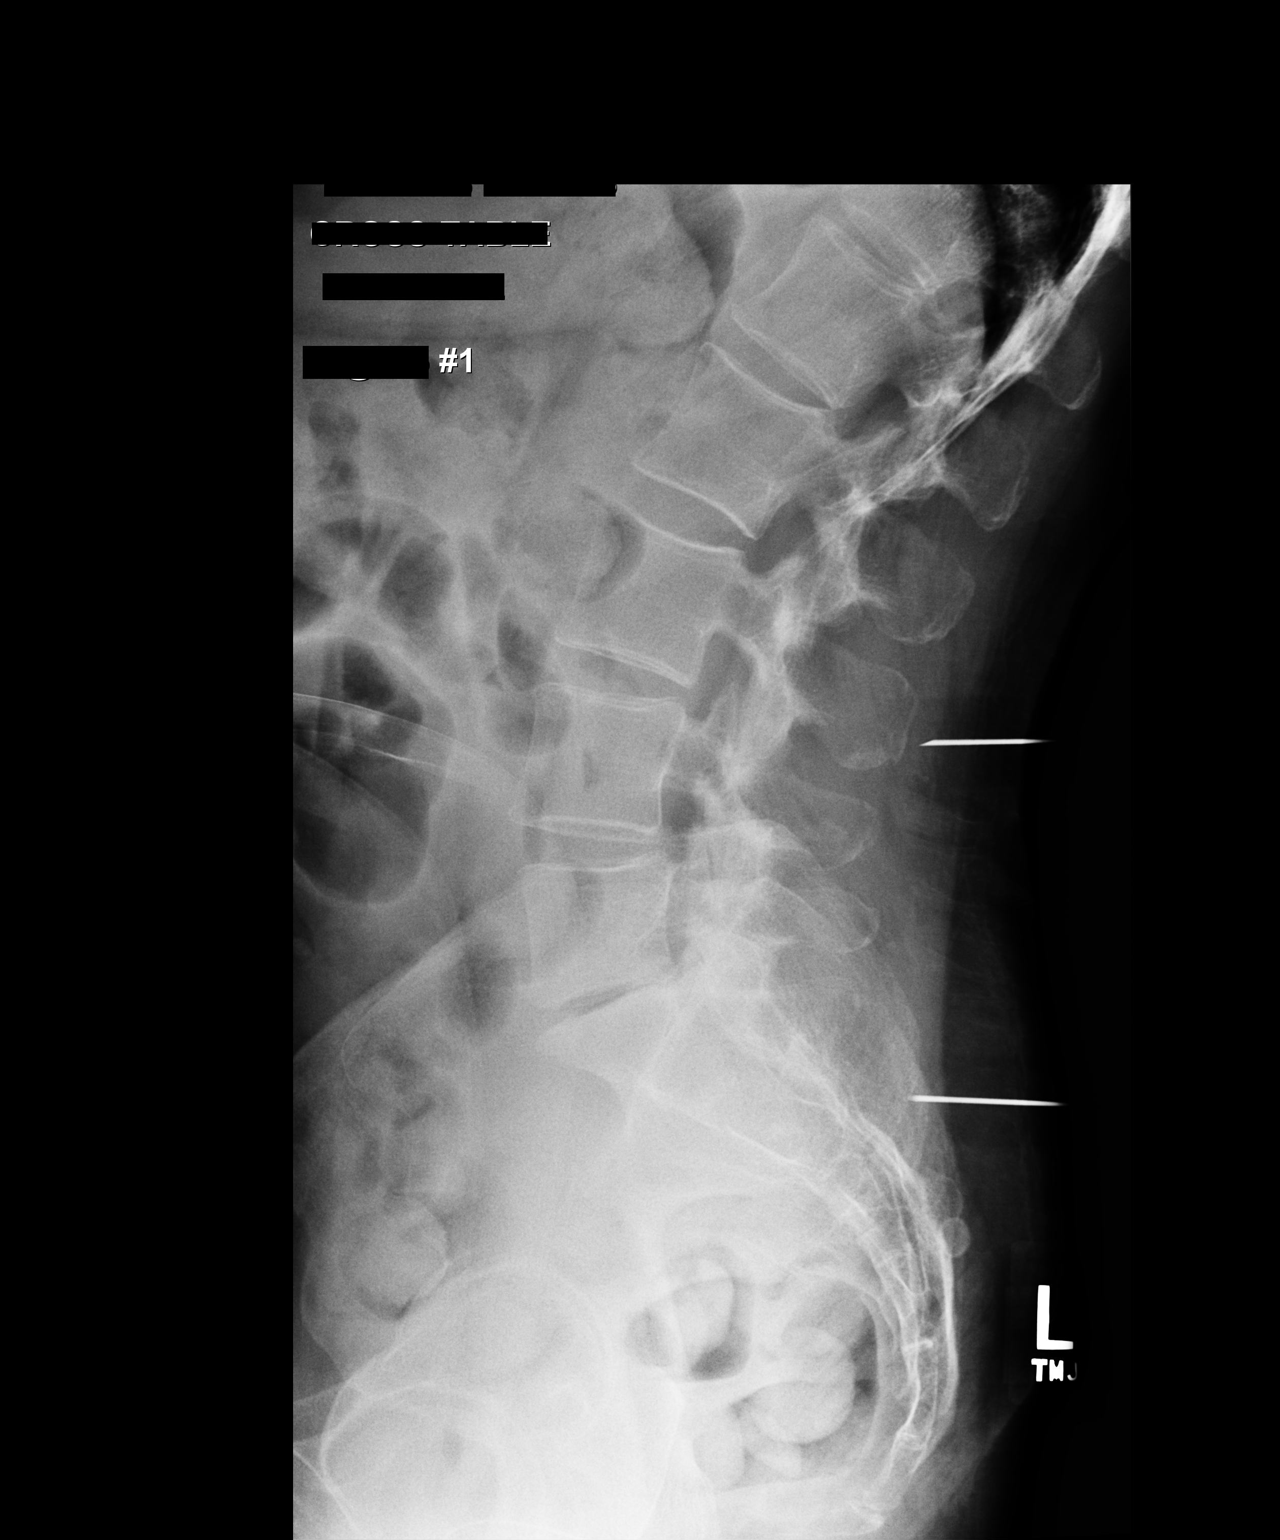

[lateral (2 of 2)]
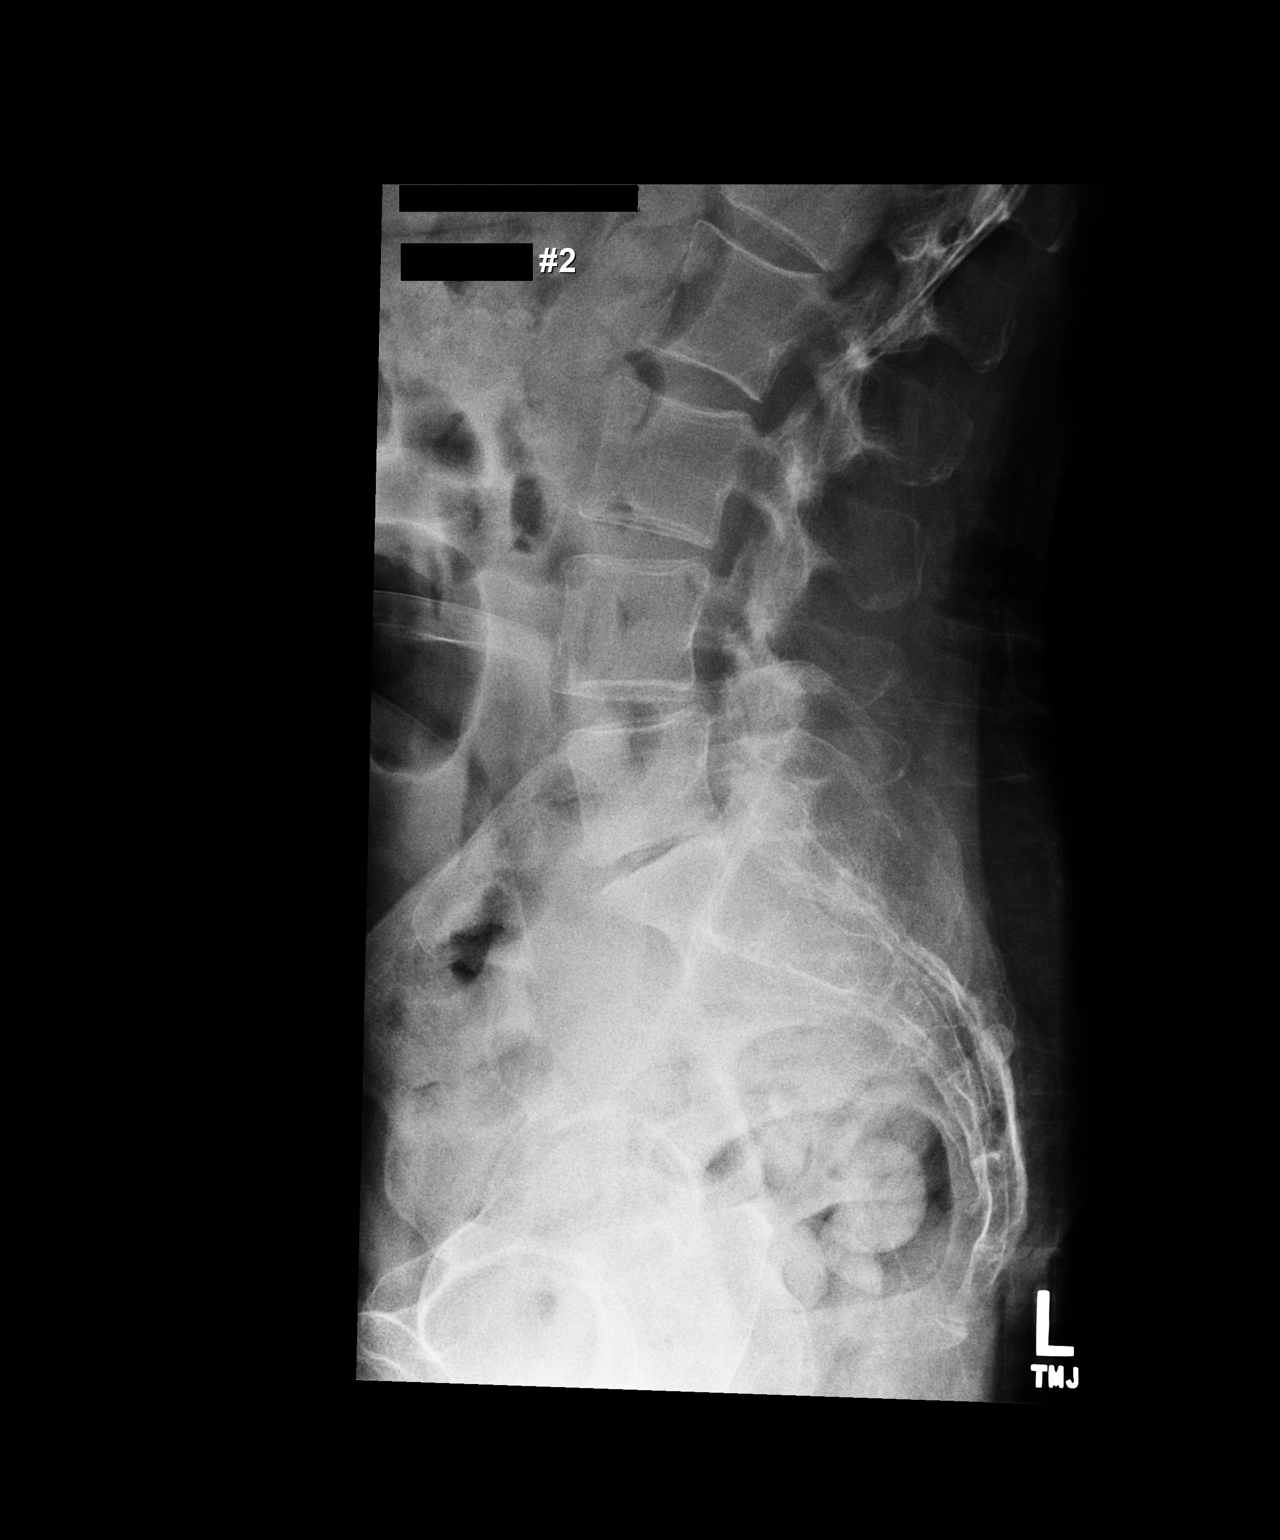

[2 of 2 positions shown; findings below may reference images not displayed]

FINDINGS: Vertebral body heights are within normal. Very subtle grade 1
anterolisthesis of L4 on L5 likely due to the facet arthropathy.
There is mild spondylosis present. There is moderate disc space
narrowing at the L5-S1 level and to a lesser extent at the L4-5
level. Surgical instruments are present with tip at the level of the
L3 spinous process and S2 level. Recommend correlation with findings
at the time of the procedure.
IMPRESSION: Mild spondylosis with facet arthropathy over the lower lumbar spine.
Disc disease at the L5-S1 level greater than the L4-5 level.
Surgical instrument with tip at the level of the L3 spinous process
and S2 level.

## 2016-11-28 ENCOUNTER — Observation Stay
Admit: 2016-11-28 | Discharge: 2016-11-28 | Disposition: A | Discharge status: Home / Self Care | Payer: Managed Care, Other (non HMO) | Attending: Internal Medicine | Admitting: Internal Medicine

## 2016-11-28 ENCOUNTER — Emergency Department: Payer: Managed Care, Other (non HMO)

## 2016-11-28 ENCOUNTER — Observation Stay
Admission: EM | Admit: 2016-11-28 | Discharge: 2016-11-29 | Disposition: A | Discharge status: Home / Self Care | Payer: Managed Care, Other (non HMO) | Attending: Internal Medicine | Admitting: Internal Medicine

## 2016-11-28 ENCOUNTER — Encounter: Payer: Self-pay | Admitting: Internal Medicine

## 2016-11-28 DIAGNOSIS — M069 Rheumatoid arthritis, unspecified: Secondary | ICD-10-CM | POA: Insufficient documentation

## 2016-11-28 DIAGNOSIS — E7800 Pure hypercholesterolemia, unspecified: Secondary | ICD-10-CM | POA: Diagnosis not present

## 2016-11-28 DIAGNOSIS — Z823 Family history of stroke: Secondary | ICD-10-CM | POA: Insufficient documentation

## 2016-11-28 DIAGNOSIS — I639 Cerebral infarction, unspecified: Secondary | ICD-10-CM | POA: Diagnosis not present

## 2016-11-28 DIAGNOSIS — R531 Weakness: Secondary | ICD-10-CM | POA: Insufficient documentation

## 2016-11-28 DIAGNOSIS — F419 Anxiety disorder, unspecified: Secondary | ICD-10-CM | POA: Diagnosis not present

## 2016-11-28 DIAGNOSIS — F329 Major depressive disorder, single episode, unspecified: Secondary | ICD-10-CM | POA: Insufficient documentation

## 2016-11-28 DIAGNOSIS — G459 Transient cerebral ischemic attack, unspecified: Principal | ICD-10-CM

## 2016-11-28 DIAGNOSIS — R202 Paresthesia of skin: Secondary | ICD-10-CM | POA: Diagnosis not present

## 2016-11-28 DIAGNOSIS — Z79899 Other long term (current) drug therapy: Secondary | ICD-10-CM | POA: Diagnosis not present

## 2016-11-28 DIAGNOSIS — Z96653 Presence of artificial knee joint, bilateral: Secondary | ICD-10-CM | POA: Diagnosis not present

## 2016-11-28 LAB — COMPREHENSIVE METABOLIC PANEL
ALT: 16 U/L (ref 14–54)
ANION GAP: 7 (ref 5–15)
AST: 22 U/L (ref 15–41)
Albumin: 4.6 g/dL (ref 3.5–5.0)
Alkaline Phosphatase: 79 U/L (ref 38–126)
BUN: 18 mg/dL (ref 6–20)
CHLORIDE: 105 mmol/L (ref 101–111)
CO2: 26 mmol/L (ref 22–32)
Calcium: 9.4 mg/dL (ref 8.9–10.3)
Creatinine, Ser: 1.11 mg/dL — ABNORMAL HIGH (ref 0.44–1.00)
GFR, EST NON AFRICAN AMERICAN: 53 mL/min — AB (ref >60)
Glucose, Bld: 101 mg/dL — ABNORMAL HIGH (ref 65–99)
POTASSIUM: 4.2 mmol/L (ref 3.5–5.1)
Sodium: 138 mmol/L (ref 135–145)
Total Bilirubin: 0.6 mg/dL (ref 0.3–1.2)
Total Protein: 8.6 g/dL — ABNORMAL HIGH (ref 6.5–8.1)

## 2016-11-28 LAB — DIFFERENTIAL
BASOS ABS: 0 10*3/uL (ref 0–0.1)
BASOS PCT: 1 %
EOS ABS: 0.1 10*3/uL (ref 0–0.7)
EOS PCT: 2 %
Lymphocytes Relative: 22 %
Lymphs Abs: 1.5 10*3/uL (ref 1.0–3.6)
MONOS PCT: 6 %
Monocytes Absolute: 0.4 10*3/uL (ref 0.2–0.9)
Neutro Abs: 4.7 10*3/uL (ref 1.4–6.5)
Neutrophils Relative %: 69 %

## 2016-11-28 LAB — LIPID PANEL
CHOL/HDL RATIO: 4.7 ratio
CHOLESTEROL: 189 mg/dL (ref 0–200)
HDL: 40 mg/dL — ABNORMAL LOW (ref >40)
LDL Cholesterol: 123 mg/dL — ABNORMAL HIGH (ref 0–99)
Triglycerides: 128 mg/dL (ref <150)
VLDL: 26 mg/dL (ref 0–40)

## 2016-11-28 LAB — CBC
HEMATOCRIT: 45.1 % (ref 35.0–47.0)
Hemoglobin: 14.6 g/dL (ref 12.0–16.0)
MCH: 30.6 pg (ref 26.0–34.0)
MCHC: 32.5 g/dL (ref 32.0–36.0)
MCV: 94.3 fL (ref 80.0–100.0)
Platelets: 268 10*3/uL (ref 150–440)
RBC: 4.78 MIL/uL (ref 3.80–5.20)
RDW: 14.1 % (ref 11.5–14.5)
WBC: 6.8 10*3/uL (ref 3.6–11.0)

## 2016-11-28 LAB — TROPONIN I

## 2016-11-28 LAB — APTT: APTT: 28 s (ref 24–36)

## 2016-11-28 LAB — PROTIME-INR
INR: 1.02
Prothrombin Time: 13.4 seconds (ref 11.4–15.2)

## 2016-11-28 LAB — GLUCOSE, CAPILLARY: GLUCOSE-CAPILLARY: 88 mg/dL (ref 65–99)

## 2016-11-28 MED ORDER — ASPIRIN 81 MG PO CHEW
324.0000 mg | CHEWABLE_TABLET | Freq: Once | ORAL | Status: AC
  Administered 2016-11-28: 324 mg via ORAL
  Filled 2016-11-28: qty 4

## 2016-11-28 MED ORDER — ATORVASTATIN CALCIUM 20 MG PO TABS
40.0000 mg | ORAL_TABLET | Freq: Every day | ORAL | Status: DC
  Administered 2016-11-28: 40 mg via ORAL
  Filled 2016-11-28: qty 2

## 2016-11-28 MED ORDER — ACETAMINOPHEN-CODEINE #3 300-30 MG PO TABS
1.0000 | ORAL_TABLET | Freq: Four times a day (QID) | ORAL | Status: DC | PRN
  Administered 2016-11-28 – 2016-11-29 (×3): 1 via ORAL
  Filled 2016-11-28 (×3): qty 1

## 2016-11-28 MED ORDER — ESCITALOPRAM OXALATE 10 MG PO TABS
40.0000 mg | ORAL_TABLET | Freq: Every day | ORAL | Status: DC
  Administered 2016-11-29: 40 mg via ORAL
  Filled 2016-11-28: qty 4

## 2016-11-28 MED ORDER — DOCUSATE SODIUM 100 MG PO CAPS: 100.0000 mg | ORAL_CAPSULE | Freq: Two times a day (BID) | ORAL | Status: DC | PRN

## 2016-11-28 MED ORDER — SPIRONOLACTONE 100 MG PO TABS
150.0000 mg | ORAL_TABLET | Freq: Every day | ORAL | Status: DC
  Administered 2016-11-29: 08:00:00 150 mg via ORAL
  Filled 2016-11-28: qty 1.5

## 2016-11-28 MED ORDER — HEPARIN SODIUM (PORCINE) 5000 UNIT/ML IJ SOLN
5000.0000 [IU] | Freq: Three times a day (TID) | INTRAMUSCULAR | Status: DC
  Administered 2016-11-28 – 2016-11-29 (×3): 5000 [IU] via SUBCUTANEOUS
  Filled 2016-11-28 (×3): qty 1

## 2016-11-28 MED ORDER — BUTALBITAL-APAP-CAFFEINE 50-325-40 MG PO TABS
1.0000 | ORAL_TABLET | ORAL | Status: DC | PRN
  Administered 2016-11-28 – 2016-11-29 (×2): 1 via ORAL
  Filled 2016-11-28 (×2): qty 1

## 2016-11-28 MED ORDER — ALPRAZOLAM 1 MG PO TABS
1.0000 mg | ORAL_TABLET | Freq: Three times a day (TID) | ORAL | Status: DC
  Administered 2016-11-28 – 2016-11-29 (×3): 1 mg via ORAL
  Filled 2016-11-28 (×3): qty 1

## 2016-11-28 MED ORDER — BACLOFEN 10 MG PO TABS
20.0000 mg | ORAL_TABLET | Freq: Two times a day (BID) | ORAL | Status: DC
  Administered 2016-11-28 – 2016-11-29 (×2): 20 mg via ORAL
  Filled 2016-11-28 (×2): qty 2

## 2016-11-28 NOTE — ED Provider Notes (Addendum)
[facility] Emergency Department Provider Note       Time seen: ----------------------------------------- 1:19 PM on 11/28/2016 -----------------------------------------     I have reviewed the triage vital signs and the nursing notes.   HISTORY   Chief Complaint Cerebrovascular Accident    HPI Donna Marsh is a 61 y.o. female who presents to the ED for left-sided heaviness and paresthesias that began around 9:30 AM. Patient states she noticed some dizziness around 5:30 AM. She presents speaking clearly having no obvious facial drooping or weakness. She is contagious describe left-sided heaviness as well as left-sided paresthesias. She denies any trouble breathing or swallowing. She has not had a history of this, nothing makes it better or worse.   Past Medical History:  Diagnosis Date  . Alopecia universalis   . Anemia    in 1979  . Anginal pain    hx of CP in 2013, went to the hospital, everything was negative  . Anxiety   . Arthritis    RA AND OA .    Marland Kitchen Blood transfusion 1979   WITH PREGNANCY  . Depression   . Edema    HX OF SWELLING OF FEET, LEGS, HANDS AND FACE--TAKES TRIAMTERENE/HCTZ FOR PREVENTION--DOES NOT HAVE HYPERTENSION AND HAS BEEN TOLD B/P USUALLY LOW  . GERD (gastroesophageal reflux disease)    OCCAS--OTC MED IF ANYTHING NEEDED  . Headache(784.0)    HX OF MIGRAINES  . Hypercholesterolemia   . PONV (postoperative nausea and vomiting)   . URI (upper respiratory infection)    URI IN OCT 2012--PT FEELS CLEAR NOW--DOES NOT USUALLY HAVE ANY PROBLEMS WITH URI  . UTI (lower urinary tract infection)    FREQUENTLY--JUST FINISHED ANTIBIOTIC SAT 06/25/11 FOR UTI  . Varicose veins    LEFT LEG    Patient Active Problem List   Diagnosis Date Noted  . Spinal stenosis 01/08/2015  . 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 ABDOMINAL HYSTERECTOMY    . CHOLECYSTECTOMY   2011  . JOINT REPLACEMENT  JAN 2006   LEFT TOTAL KNEE ARTHROPLASTY  A  . JOINT REPLACEMENT  JAN 2012   RIGHT TOTAL KNEE ARTHROPLASTY  . TOTAL KNEE REVISION  07/04/2011   Procedure: bilateral TOTAL KNEE REVISION;  Surgeon: Shelda Pal;  Location: [facility];  Service: Orthopedics;  Laterality: Right;  Right Total Knee Revision with Scar Debridement/Patella Revision with Poly Exchange    Allergies Patient has no known allergies.  Social History Social History  Substance Use Topics  . Smoking status: Never Smoker  . Smokeless tobacco: Never Used  . Alcohol use Yes     Comment: RARELY    Review of Systems Constitutional: Negative for fever. Eyes: Negative for vision changes ENT:  Negative for congestion, sore throat Cardiovascular: Negative for chest pain. Respiratory: Negative for shortness of breath. Gastrointestinal: Negative for abdominal pain, vomiting and diarrhea. Genitourinary: Negative for dysuria. Musculoskeletal: Negative for back pain. Skin: Negative for rash. Neurological: Positive for left-sided numbness and weakness  All systems negative/normal/unremarkable except as stated in the HPI  ____________________________________________   PHYSICAL EXAM:  VITAL SIGNS: ED Triage Vitals  Enc Vitals Group     BP 11/28/16 1302 120/75     Pulse Rate 11/28/16 1302 82     Resp 11/28/16 1302 15     Temp 11/28/16 1302 99.1 F (37.3 C)     Temp Source 11/28/16 1302 Oral  SpO2 11/28/16 1302 100 %     Weight 11/28/16 1226 150 lb (68 kg)     Height 11/28/16 1226 5\' 2"  (1.575 m)     Head Circumference --      Peak Flow --      Pain Score 11/28/16 1226 8     Pain Loc --      Pain Edu? --      Excl. in GC? --     Constitutional: Alert and oriented. Well appearing and in no distress. Eyes: Conjunctivae are normal. PERRL. Normal extraocular movements. ENT   Head: Normocephalic and atraumatic.   Nose: No congestion/rhinnorhea.   Mouth/Throat: Mucous  membranes are moist.   Neck: No stridor. Cardiovascular: Normal rate, regular rhythm. No murmurs, rubs, or gallops. Respiratory: Normal respiratory effort without tachypnea nor retractions. Breath sounds are clear and equal bilaterally. No wheezes/rales/rhonchi. Gastrointestinal: Soft and nontender. Normal bowel sounds Musculoskeletal: Nontender with normal range of motion in extremities. No lower extremity tenderness nor edema. Neurologic:  Normal speech and language. Paresthesias noted to the left arm, left leg and left face. Cranial nerves are otherwise intact. Normal strength and cerebellar function. Skin:  Skin is warm, dry and intact. No rash noted. Psychiatric: Mood and affect are normal. Speech and behavior are normal.  ____________________________________________  EKG: Interpreted by me. Sinus rhythm rate 83 bpm, normal PR interval, normal QRS, normal QT, normal axis.  ____________________________________________  ED COURSE:  Pertinent labs & imaging results that were available during my care of the patient were reviewed by me and considered in my medical decision making (see chart for details). Patient presents for paresthesias and weakness, we will assess with labs and imaging as indicated.   Procedures ____________________________________________   LABS (pertinent positives/negatives)  Labs Reviewed  COMPREHENSIVE METABOLIC PANEL - Abnormal; Notable for the following:       Result Value   Glucose, Bld 101 (*)    Creatinine, Ser 1.11 (*)    Total Protein 8.6 (*)    GFR calc non Af Amer 53 (*)    All other components within normal limits  PROTIME-INR  APTT  CBC  DIFFERENTIAL  TROPONIN I  GLUCOSE, CAPILLARY  CBG MONITORING, ED    RADIOLOGY  CT head is unremarkable  ____________________________________________  FINAL ASSESSMENT AND PLAN  Paresthesias and weakness  Plan: Patient's labs and imaging were dictated above. Patient had presented as above with  negative workup to this point. Patient's been evaluated by neurology and it was recommended that she be admitted for full CVA workup. She is agreeable to that at this time. She was given an adult dose aspirin and is medical stable for admission.   01/28/17, MD   Note: This note was generated in part or whole with voice recognition software. Voice recognition is usually quite accurate but there are transcription errors that can and very often do occur. I apologize for any typographical errors that were not detected and corrected.     Emily Filbert, MD 11/28/16 1321    01/28/17, MD 11/28/16 [phone]

## 2016-11-28 NOTE — Progress Notes (Signed)
CH made a follow up visit. Pt is waiting to be transferred to a room. Pt states she still has occasional numbness, but overall is feeling better. CH will follow up tomorrow.    11/28/16 1500  Clinical Encounter Type  Visited With Patient  Visit Type Follow-up  Consult/Referral To Chaplain  Spiritual Encounters  Spiritual Needs Emotional

## 2016-11-28 NOTE — Progress Notes (Signed)
CH responded to a PG for Code Stroke in ED10. Pt was being assessed by the Medical team. CH escorted the husband from ED waiting room to ED10. CH provided a calming presence. Seeing they were in a good space I excused myself. CH will follow progress and follow up once admitted.    11/28/16 1300  Clinical Encounter Type  Visited With Patient;Patient and family together;Health care provider  Visit Type Initial;Spiritual support;Code;ED (Code Stroke)  Referral From Nurse  Consult/Referral To Chaplain  Spiritual Encounters  Spiritual Needs Emotional

## 2016-11-28 NOTE — ED Notes (Signed)
Assisted patient to use restroom. Patient able to ambulate with standby assistance.

## 2016-11-28 NOTE — ED Notes (Signed)
CODE STROKE CALLED TO [id number] 

## 2016-11-28 NOTE — Consult Note (Signed)
Referring Physician: Mayford Knife    Chief Complaint: Dizziness, left sided numbness  HPI: Donna Marsh is an 61 y.o. female who reports going to bed last evening at baseline.  Awakened early this morning with complaints of left sided numbness and heaviness.  Reports dizziness as well that she describes as feeling as if she is going to "pass out".  With no improvement in her symptoms patient presented for evaluation.  Initial NIHSS of 2.    Date last known well: Date: 11/27/2016 Time last known well: Time: 22:30 tPA Given: No: Outside time window  Past Medical History:  Diagnosis Date  . Alopecia universalis   . Anemia    in 1979  . Anginal pain    hx of CP in 2013, went to the hospital, everything was negative  . Anxiety   . Arthritis    RA AND OA .    Marland Kitchen Blood transfusion 1979   WITH PREGNANCY  . Depression   . Edema    HX OF SWELLING OF FEET, LEGS, HANDS AND FACE--TAKES TRIAMTERENE/HCTZ FOR PREVENTION--DOES NOT HAVE HYPERTENSION AND HAS BEEN TOLD B/P USUALLY LOW  . GERD (gastroesophageal reflux disease)    OCCAS--OTC MED IF ANYTHING NEEDED  . Headache(784.0)    HX OF MIGRAINES  . Hypercholesterolemia   . PONV (postoperative nausea and vomiting)   . URI (upper respiratory infection)    URI IN OCT 2012--PT FEELS CLEAR NOW--DOES NOT USUALLY HAVE ANY PROBLEMS WITH URI  . UTI (lower urinary tract infection)    FREQUENTLY--JUST FINISHED ANTIBIOTIC SAT 06/25/11 FOR UTI  . Varicose veins    LEFT LEG    Past Surgical History:  Procedure Laterality Date  . ABDOMINAL HYSTERECTOMY    . CHOLECYSTECTOMY  2011  . JOINT REPLACEMENT  JAN 2006   LEFT TOTAL KNEE ARTHROPLASTY  A  . JOINT REPLACEMENT  JAN 2012   RIGHT TOTAL KNEE ARTHROPLASTY  . TOTAL KNEE REVISION  07/04/2011   Procedure: bilateral TOTAL KNEE REVISION;  Surgeon: Shelda Pal;  Location: [facility];  Service: Orthopedics;  Laterality: Right;  Right Total Knee Revision with Scar Debridement/Patella Revision with Poly  Exchange    Family history: Father with HTN and CAD, s/p MI.  Mother with ovarian cancer.  Social History:  reports that she has never smoked. She has never used smokeless tobacco. She reports that she drinks alcohol. She reports that she does not use drugs.  Allergies: No Known Allergies  Medications: I have reviewed the patient's current medications. Prior to Admission:  Prior to Admission medications   Medication Sig Start Date End Date Taking? Authorizing Provider  ALPRAZolam Prudy Feeler) 1 MG tablet Take 1 mg by mouth 3 (three) times daily.     [provider]  doxepin (SINEQUAN) 25 MG capsule Take 100 mg by mouth at bedtime.    [provider]  lansoprazole (PREVACID) 30 MG capsule Take 30 mg by mouth 2 (two) times daily.    [provider]  lubiprostone (AMITIZA) 24 MCG capsule Take 24 mcg by mouth 2 (two) times daily with a meal.    [provider]  pantoprazole (PROTONIX) 40 MG tablet Take 40 mg by mouth daily.    [provider]  spironolactone (ALDACTONE) 50 MG tablet Take 150 mg by mouth daily.    [provider]  venlafaxine (EFFEXOR-XR) 75 MG 24 hr capsule Take 75 mg by mouth every morning.      [provider]     ROS:  History obtained from the patient  General ROS: negative for - chills, fatigue, fever, night sweats, weight gain or weight loss Psychological ROS: negative for - behavioral disorder, hallucinations, memory difficulties, mood swings or suicidal ideation Ophthalmic ROS: negative for - blurry vision, double vision, eye pain or loss of vision ENT ROS: negative for - epistaxis, nasal discharge, oral lesions, sore throat, tinnitus or vertigo Allergy and Immunology ROS: negative for - hives or itchy/watery eyes Hematological and Lymphatic ROS: negative for - bleeding problems, bruising or swollen lymph nodes Endocrine ROS: negative for - galactorrhea, hair pattern changes, polydipsia/polyuria or  temperature intolerance Respiratory ROS: negative for - cough, hemoptysis, shortness of breath or wheezing Cardiovascular ROS: negative for - chest pain, dyspnea on exertion, edema or irregular heartbeat Gastrointestinal ROS: negative for - abdominal pain, diarrhea, hematemesis, nausea/vomiting or stool incontinence Genito-Urinary ROS: negative for - dysuria, hematuria, incontinence or urinary frequency/urgency Musculoskeletal ROS: negative for - joint swelling or muscular weakness Neurological ROS: as noted in HPI Dermatological ROS: negative for rash and skin lesion changes  Physical Examination: Height 5\' 2"  (1.575 m), weight 68 kg (150 lb).  HEENT-  Normocephalic, no lesions, without obvious abnormality.  Normal external eye and conjunctiva.  Normal TM's bilaterally.  Normal auditory canals and external ears. Normal external nose, mucus membranes and septum.  Normal pharynx. Cardiovascular- S1, S2 normal, pulses palpable throughout   Lungs- chest clear, no wheezing, rales, normal symmetric air entry Abdomen- soft, non-tender; bowel sounds normal; no masses,  no organomegaly Extremities- no edema Lymph-no adenopathy palpable Musculoskeletal-no joint tenderness, deformity or swelling Skin-warm and dry, no hyperpigmentation, vitiligo, or suspicious lesions  Neurological Examination   Mental Status: Alert, oriented, thought content appropriate.  Speech fluent without evidence of aphasia.  Able to follow 3 step commands without difficulty. Cranial Nerves: II: Discs flat bilaterally; Visual fields grossly normal, pupils equal, round, reactive to light and accommodation III,IV, VI: ptosis not present, extra-ocular motions intact bilaterally V,VII: smile symmetric, facial light touch sensation decreased on the left VIII: hearing normal bilaterally IX,X: gag reflex present XI: bilateral shoulder shrug XII: midline tongue extension Motor: Right : Upper extremity   5/5    Left:     Upper  extremity   5/5  Lower extremity   5/5     Lower extremity   5-/5 Tone and bulk:normal tone throughout; no atrophy noted Sensory: Pinprick and light touch decreased in the LUE and LLE Deep Tendon Reflexes: 2+ in the BUE's and at the right KJ.  DTR's absent otherwise.   Plantars: Right: mute   Left: mute Cerebellar: Normal finger-to-nose and normal heel-to-shin testing bilaterally Gait: not tested due to safety concerns   Laboratory Studies:  Basic Metabolic Panel: No results for input(s): NA, K, CL, CO2, GLUCOSE, BUN, CREATININE, CALCIUM, MG, PHOS in the last 168 hours.  Liver Function Tests: No results for input(s): AST, ALT, ALKPHOS, BILITOT, PROT, ALBUMIN in the last 168 hours. No results for input(s): LIPASE, AMYLASE in the last 168 hours. No results for input(s): AMMONIA in the last 168 hours.  CBC: No results for input(s): WBC, NEUTROABS, HGB, HCT, MCV, PLT in the last 168 hours.  Cardiac Enzymes: No results for input(s): CKTOTAL, CKMB, CKMBINDEX, TROPONINI in the last 168 hours.  BNP: Invalid input(s): POCBNP  CBG:  Recent Labs Lab 11/28/16 1244  GLUCAP 88    Microbiology: Results for orders placed or performed during the hospital encounter of 01/06/15  Surgical pcr screen     Status: Abnormal  Collection Time: 01/06/15 11:15 AM  Result Value Ref Range Status   MRSA, PCR NEGATIVE NEGATIVE Final   Staphylococcus aureus POSITIVE (A) NEGATIVE Final    Comment:        The Xpert SA Assay (FDA approved for NASAL specimens in patients over 13 years of age), is one component of a comprehensive surveillance program.  Test performance has been validated by [facility] for patients greater than or equal to 18 year old. It is not intended to diagnose infection nor to guide or monitor treatment.     Coagulation Studies: No results for input(s): LABPROT, INR in the last 72 hours.  Urinalysis: No results for input(s): COLORURINE, LABSPEC, PHURINE, GLUCOSEU,  HGBUR, BILIRUBINUR, KETONESUR, PROTEINUR, UROBILINOGEN, NITRITE, LEUKOCYTESUR in the last 168 hours.  Invalid input(s): APPERANCEUR  Lipid Panel:    Component Value Date/Time   CHOL 196 04/24/2012 0103   TRIG 251 (H) 04/24/2012 0103   HDL 48 04/24/2012 0103   VLDL 50 (H) 04/24/2012 0103   LDLCALC 98 04/24/2012 0103    HgbA1C:  Lab Results  Component Value Date   HGBA1C 4.9 04/24/2012    Urine Drug Screen:  No results found for: LABOPIA, COCAINSCRNUR, LABBENZ, AMPHETMU, THCU, LABBARB  Alcohol Level: No results for input(s): ETH in the last 168 hours.  Other results: EKG: normal sinus rhythm  Imaging: No results found.  Assessment: 60 y.o. female presenting with complaints of light headedness and left sided numbness.  Patient outside window for tPA.  On no antiplatelet therapy at home.  Head CT  Reviewed and shows no acute changes but acute infarct remains on the differential.  Further work up recommended.    Stroke Risk Factors - hyperlipidemia  Plan: 1. HgbA1c, fasting lipid panel 2. MRI, MRA  of the brain without contrast 3. PT consult, OT consult, Speech consult 4. Echocardiogram 5. Carotid dopplers 6. Prophylactic therapy-Antiplatelet med: Aspirin - dose 325mg  daily 7. NPO until RN stroke swallow screen 8. Telemetry monitoring 9. Frequent neuro checks   Case discussed with Dr. Newton Pigg, MD Neurology [phone] 11/28/2016, 12:50 PM

## 2016-11-28 NOTE — H&P (Signed)
[facility] [place] [facility]   PATIENT NAME: Donna Marsh    MR#:  [id number]  DATE OF BIRTH:  [birth year]  DATE OF ADMISSION:  11/28/2016  PRIMARY CARE PHYSICIAN: Dortha Kern, MD   REQUESTING/REFERRING PHYSICIAN: williams.  CHIEF COMPLAINT:   Chief Complaint  Patient presents with  . Cerebrovascular Accident    HISTORY OF PRESENT ILLNESS: Donna Marsh  is a 61 y.o. female with a known history of Anemia, anginal pain, anxiety, arthritis, blood transfusion, depression, edema, gastric surgery for disease, headache, hypercholesterolemia, vericose vein- today morning at 5:00 she woke up with some left-sided weakness continue doing her regular work for 2-3 hours still complaining did not resolve and later on decided to come to emergency room. In the ER CT scan of the head was negative and seen by neurologist as she presented out of the window for TPA she suggested to just to the stroke workup.  Later in ER patient also felt some numbness on the right side of her face.   PAST MEDICAL HISTORY:   Past Medical History:  Diagnosis Date  . Alopecia universalis   . Anemia    in 1979  . Anginal pain (HCC)    hx of CP in 2013, went to the hospital, everything was negative  . Anxiety   . Arthritis    RA AND OA .    Marland Kitchen Blood transfusion 1979   WITH PREGNANCY  . Depression   . Edema    HX OF SWELLING OF FEET, LEGS, HANDS AND FACE--TAKES TRIAMTERENE/HCTZ FOR PREVENTION--DOES NOT HAVE HYPERTENSION AND HAS BEEN TOLD B/P USUALLY LOW  . GERD (gastroesophageal reflux disease)    OCCAS--OTC MED IF ANYTHING NEEDED  . Headache(784.0)    HX OF MIGRAINES  . Hypercholesterolemia   . PONV (postoperative nausea and vomiting)   . URI (upper respiratory infection)    URI IN OCT 2012--PT FEELS CLEAR NOW--DOES NOT USUALLY HAVE ANY PROBLEMS WITH URI  . UTI (lower urinary tract infection)    FREQUENTLY--JUST FINISHED ANTIBIOTIC SAT 06/25/11 FOR UTI  . Varicose veins    LEFT  LEG    PAST SURGICAL HISTORY: Past Surgical History:  Procedure Laterality Date  . ABDOMINAL HYSTERECTOMY    . CHOLECYSTECTOMY  2011  . JOINT REPLACEMENT  JAN 2006   LEFT TOTAL KNEE ARTHROPLASTY  A  . JOINT REPLACEMENT  JAN 2012   RIGHT TOTAL KNEE ARTHROPLASTY  . TOTAL KNEE REVISION  07/04/2011   Procedure: bilateral TOTAL KNEE REVISION;  Surgeon: Shelda Pal;  Location: [facility];  Service: Orthopedics;  Laterality: Right;  Right Total Knee Revision with Scar Debridement/Patella Revision with Poly Exchange    SOCIAL HISTORY:  Social History  Substance Use Topics  . Smoking status: Never Smoker  . Smokeless tobacco: Never Used  . Alcohol use Yes     Comment: RARELY    FAMILY HISTORY:  Family History  Problem Relation Age of Onset  . Breast cancer Mother   . CAD Father   . Stroke Paternal Grandfather     DRUG ALLERGIES: No Known Allergies  REVIEW OF SYSTEMS:   CONSTITUTIONAL: No fever, fatigue or weakness.  EYES: No blurred or double vision.  EARS, NOSE, AND THROAT: No tinnitus or ear pain.  RESPIRATORY: No cough, shortness of breath, wheezing or hemoptysis.  CARDIOVASCULAR: No chest pain, orthopnea, edema.  GASTROINTESTINAL: No nausea, vomiting, diarrhea or abdominal pain.  GENITOURINARY: No dysuria, hematuria.  ENDOCRINE: No polyuria, nocturia,  HEMATOLOGY: No  anemia, easy bruising or bleeding SKIN: No rash or lesion. MUSCULOSKELETAL: No joint pain or arthritis.   NEUROLOGIC: No tingling, numbness, Left-sided upper and lower extremity weakness.  PSYCHIATRY: No anxiety or depression.   MEDICATIONS AT HOME:  Prior to Admission medications   Medication Sig Start Date End Date Taking? Authorizing Provider  acetaminophen-codeine (TYLENOL #3) 300-30 MG tablet Take 1 tablet by mouth every 6 (six) hours as needed for moderate pain or severe pain. Take 1 tablet every 6 to 8 hours as needed for pain.   Yes [provider]  ALPRAZolam Prudy Feeler) 1 MG tablet Take 1 mg  by mouth 3 (three) times daily.    Yes [provider]  baclofen (LIORESAL) 20 MG tablet Take 20 mg by mouth 2 (two) times daily.   Yes [provider]  escitalopram (LEXAPRO) 20 MG tablet Take 40 mg by mouth daily.   Yes [provider]  spironolactone (ALDACTONE) 50 MG tablet Take 150 mg by mouth daily.   Yes [provider]      PHYSICAL EXAMINATION:   VITAL SIGNS: Blood pressure 124/80, pulse 67, temperature 99.1 F (37.3 C), resp. rate 10, height 5\' 2"  (1.575 m), weight 68 kg (150 lb), SpO2 98 %.  GENERAL:  61 y.o.-year-old patient lying in the bed with no acute distress.  EYES: Pupils equal, round, reactive to light and accommodation. No scleral icterus. Extraocular muscles intact.  HEENT: Head atraumatic, normocephalic. Oropharynx and nasopharynx clear.  NECK:  Supple, no jugular venous distention. No thyroid enlargement, no tenderness.  LUNGS: Normal breath sounds bilaterally, no wheezing, rales,rhonchi or crepitation. No use of accessory muscles of respiration.  CARDIOVASCULAR: S1, S2 normal. No murmurs, rubs, or gallops.  ABDOMEN: Soft, nontender, nondistended. Bowel sounds present. No organomegaly or mass.  EXTREMITIES: No pedal edema, cyanosis, or clubbing.  NEUROLOGIC: Cranial nerves II through XII are intact. Muscle strength 5/5 on right side and 3-4/5 on left side extremities. Sensation intact. Gait not checked.  PSYCHIATRIC: The patient is alert and oriented x 3.  SKIN: No obvious rash, lesion, or ulcer.   LABORATORY PANEL:   CBC  Recent Labs Lab 11/28/16 1247  WBC 6.8  HGB 14.6  HCT 45.1  PLT 268  MCV 94.3  MCH 30.6  MCHC 32.5  RDW 14.1  LYMPHSABS 1.5  MONOABS 0.4  EOSABS 0.1  BASOSABS 0.0   ------------------------------------------------------------------------------------------------------------------  Chemistries   Recent Labs Lab 11/28/16 1247  NA 138  K 4.2  CL 105  CO2 26  GLUCOSE 101*  BUN 18   CREATININE 1.11*  CALCIUM 9.4  AST 22  ALT 16  ALKPHOS 79  BILITOT 0.6   ------------------------------------------------------------------------------------------------------------------ estimated creatinine clearance is 48.8 mL/min (A) (by C-G formula based on SCr of 1.11 mg/dL (H)). ------------------------------------------------------------------------------------------------------------------ No results for input(s): TSH, T4TOTAL, T3FREE, THYROIDAB in the last 72 hours.  Invalid input(s): FREET3   Coagulation profile  Recent Labs Lab 11/28/16 1247  INR 1.02   ------------------------------------------------------------------------------------------------------------------- No results for input(s): DDIMER in the last 72 hours. -------------------------------------------------------------------------------------------------------------------  Cardiac Enzymes  Recent Labs Lab 11/28/16 1247  TROPONINI <0.03   ------------------------------------------------------------------------------------------------------------------ Invalid input(s): POCBNP  ---------------------------------------------------------------------------------------------------------------  Urinalysis    Component Value Date/Time   COLORURINE YELLOW 01/08/2015 1214   APPEARANCEUR HAZY (A) 01/08/2015 1214   LABSPEC 1.014 01/08/2015 1214   PHURINE 5.0 01/08/2015 1214   GLUCOSEU NEGATIVE 01/08/2015 1214   HGBUR NEGATIVE 01/08/2015 1214   BILIRUBINUR NEGATIVE 01/08/2015 1214   KETONESUR NEGATIVE 01/08/2015 1214  PROTEINUR NEGATIVE 01/08/2015 1214   UROBILINOGEN 0.2 01/08/2015 1214   NITRITE POSITIVE (A) 01/08/2015 1214   LEUKOCYTESUR MODERATE (A) 01/08/2015 1214     RADIOLOGY: Ct Head Code Stroke W/o Cm  Result Date: 11/28/2016 CLINICAL DATA:  Code stroke.  Left-sided numbness.  Dizziness. EXAM: CT HEAD WITHOUT CONTRAST TECHNIQUE: Contiguous axial images were obtained from the base of the  skull through the vertex without intravenous contrast. COMPARISON:  Brain MRI 08/02/2012 FINDINGS: Brain: There is no evidence of acute infarct, intracranial hemorrhage, mass, midline shift, or extra-axial fluid collection. There is mild cerebral atrophy. Vascular: Mild bilateral carotid siphon calcified atherosclerosis. No hyperdense vessel. Skull: No evidence of fracture or focal osseous lesion. Sinuses/Orbits: Visualized paranasal sinuses and mastoid air cells are clear. Visualized orbits are unremarkable. Other: None. ASPECTS [facility] Stroke Program Early CT Score) - Ganglionic level infarction (caudate, lentiform nuclei, internal capsule, insula, M1-M3 cortex): 7 - Supraganglionic infarction (M4-M6 cortex): 3 Total score (0-10 with 10 being normal): 10 IMPRESSION: 1. No evidence of acute intracranial abnormality. 2. ASPECTS is 10. These results were called by telephone at the time of interpretation on 11/28/2016 at 12:48 pm to Dr. Ileana Roup , who verbally acknowledged these results. Electronically Signed   By: Sebastian Ache M.D.   On: 11/28/2016 12:49    EKG: Orders placed or performed during the hospital encounter of 11/28/16  . ED EKG  . ED EKG  . EKG 12-Lead  . EKG 12-Lead    IMPRESSION AND PLAN:  * TIA    CT scan of the head is negative, will monitor on telemetry, we'll do MRI of the brain and MRA, carotid Doppler study and echocardiogram.   PT evaluation.   Continue aspirin and start on statin.   Check hemoglobin A1c and lipid panel.  * Anxiety   Continue home medications.  All the records are reviewed and case discussed with ED provider. Management plans discussed with the patient, family and they are in agreement.  CODE STATUS:full code  Code Status History    Date Active Date Inactive Code Status Order ID Comments User Context   01/08/2015  6:59 PM 01/10/2015  1:44 PM Full Code [id number]  Georga Bora, PA-C Inpatient   07/04/2011  4:29 PM 07/06/2011  2:29 PM Full Code  [id number]  Otto Herb, RN Inpatient       TOTAL TIME TAKING CARE OF THIS PATIENT: 50  minutes.    Altamese Dilling M.D on 11/28/2016   Between 7am to 6pm - Pager - [phone]  After 6pm go to [email or web address] - password [email or web address]  [facility]  Office  [phone]  CC: Primary care physician; Dortha Kern, MD   Note: This dictation was prepared with Dragon dictation along with smaller phrase technology. Any transcriptional errors that result from this process are unintentional.

## 2016-11-28 NOTE — ED Triage Notes (Signed)
Patient presents to the ED with left sided, "heaviness" that began at 9:30am after she states she noticed some dizziness around 5:30am.  Patient is speaking clearly.  No obvious facial droop.  No obvious arm droop.  Patient is in no obvious distress at this time.

## 2016-11-29 ENCOUNTER — Observation Stay: Payer: Managed Care, Other (non HMO)

## 2016-11-29 DIAGNOSIS — R202 Paresthesia of skin: Secondary | ICD-10-CM | POA: Diagnosis not present

## 2016-11-29 LAB — CBC
HCT: 44.6 % (ref 35.0–47.0)
HEMOGLOBIN: 15 g/dL (ref 12.0–16.0)
MCH: 31.4 pg (ref 26.0–34.0)
MCHC: 33.6 g/dL (ref 32.0–36.0)
MCV: 93.5 fL (ref 80.0–100.0)
PLATELETS: 280 10*3/uL (ref 150–440)
RBC: 4.77 MIL/uL (ref 3.80–5.20)
RDW: 14 % (ref 11.5–14.5)
WBC: 6.7 10*3/uL (ref 3.6–11.0)

## 2016-11-29 LAB — ECHOCARDIOGRAM COMPLETE
CHL CUP MV DEC (S): 303
E decel time: 303 msec
EERAT: 6.78
FS: 32 % (ref 28–44)
Height: 62 in
IV/PV OW: 0.85
LA diam index: 1.72 cm/m2
LA vol: 25.8 mL
LASIZE: 30 mm
LAVOLA4C: 22.4 mL
LAVOLIN: 14.8 mL/m2
LEFT ATRIUM END SYS DIAM: 30 mm
LV PW d: 9.23 mm — AB (ref 0.6–1.1)
LV TDI E'MEDIAL: 6.31
LVEEAVG: 6.78
LVEEMED: 6.78
LVELAT: 10.8 cm/s
Lateral S' vel: 12 cm/s
MV Peak grad: 2 mmHg
MV pk A vel: 92.6 m/s
MV pk E vel: 73.2 m/s
MVAP: 2.47 cm2
P 1/2 time: 89 ms
TAPSE: 23.4 mm
TDI e' lateral: 10.8
Weight: 2400 oz

## 2016-11-29 LAB — BASIC METABOLIC PANEL
Anion gap: 8 (ref 5–15)
BUN: 19 mg/dL (ref 6–20)
CALCIUM: 9.3 mg/dL (ref 8.9–10.3)
CO2: 28 mmol/L (ref 22–32)
CREATININE: 1.02 mg/dL — AB (ref 0.44–1.00)
Chloride: 102 mmol/L (ref 101–111)
GFR calc Af Amer: >60 mL/min (ref >60)
GFR, EST NON AFRICAN AMERICAN: 59 mL/min — AB (ref >60)
GLUCOSE: 95 mg/dL (ref 65–99)
Potassium: 3.8 mmol/L (ref 3.5–5.1)
SODIUM: 138 mmol/L (ref 135–145)

## 2016-11-29 LAB — HEMOGLOBIN A1C
HEMOGLOBIN A1C: 5.4 % (ref 4.8–5.6)
MEAN PLASMA GLUCOSE: 108 mg/dL

## 2016-11-29 LAB — SEDIMENTATION RATE: SED RATE: 11 mm/h (ref 0–30)

## 2016-11-29 MED ORDER — TRAMADOL HCL 50 MG PO TABS: 50.0000 mg | ORAL_TABLET | Freq: Four times a day (QID) | ORAL | 0 refills | Status: DC | PRN

## 2016-11-29 MED ORDER — ASPIRIN EC 81 MG PO TBEC: 81.0000 mg | DELAYED_RELEASE_TABLET | Freq: Every day | ORAL | 2 refills | Status: DC

## 2016-11-29 MED ORDER — TRAMADOL HCL 50 MG PO TABS
50.0000 mg | ORAL_TABLET | Freq: Four times a day (QID) | ORAL | Status: DC | PRN
  Administered 2016-11-29: 15:00:00 50 mg via ORAL
  Filled 2016-11-29: qty 1

## 2016-11-29 MED ORDER — BACLOFEN 20 MG PO TABS: 20.0000 mg | ORAL_TABLET | Freq: Two times a day (BID) | ORAL | 0 refills | Status: AC | PRN

## 2016-11-29 MED ORDER — ATORVASTATIN CALCIUM 40 MG PO TABS: 40.0000 mg | ORAL_TABLET | Freq: Every day | ORAL | 2 refills | Status: DC

## 2016-11-29 NOTE — Discharge Summary (Signed)
[facility] [place] [facility]   PATIENT NAME: Donna Marsh    MR#:  [id number]  DATE OF BIRTH:  19-Nov-1955  DATE OF ADMISSION:  11/28/2016   ADMITTING PHYSICIAN: Altamese Dilling, MD DATE OF DISCHARGE:   11/29/2016 PRIMARY CARE PHYSICIAN: Dortha Kern, MD   ADMISSION DIAGNOSIS:  Weakness [R53.1] Cerebral infarction (HCC) [I63.9] CVA (cerebral infarction) [I63.9] Paresthesia of left arm and leg [R20.2] DISCHARGE DIAGNOSIS:  Principal Problem:   TIA (transient ischemic attack) HLP. SECONDARY DIAGNOSIS:   Past Medical History:  Diagnosis Date  . Alopecia universalis   . Anemia    in 1979  . Anginal pain (HCC)    hx of CP in 2013, went to the hospital, everything was negative  . Anxiety   . Arthritis    RA AND OA .    Marland Kitchen Blood transfusion 1979   WITH PREGNANCY  . Depression   . Edema    HX OF SWELLING OF FEET, LEGS, HANDS AND FACE--TAKES TRIAMTERENE/HCTZ FOR PREVENTION--DOES NOT HAVE HYPERTENSION AND HAS BEEN TOLD B/P USUALLY LOW  . GERD (gastroesophageal reflux disease)    OCCAS--OTC MED IF ANYTHING NEEDED  . Headache(784.0)    HX OF MIGRAINES  . Hypercholesterolemia   . PONV (postoperative nausea and vomiting)   . URI (upper respiratory infection)    URI IN OCT 2012--PT FEELS CLEAR NOW--DOES NOT USUALLY HAVE ANY PROBLEMS WITH URI  . UTI (lower urinary tract infection)    FREQUENTLY--JUST FINISHED ANTIBIOTIC SAT 06/25/11 FOR UTI  . Varicose veins    LEFT LEG   HOSPITAL COURSE:  Donna Marsh  is a 61 y.o. female with a known history of Anemia, anginal pain, anxiety, arthritis, blood transfusion, depression, edema, gastric surgery for disease, headache, hypercholesterolemia, vericose vein, was admitted for some left-sided weakness  * TIA    CT scan of the head is negative, MRI of the brain and MRA, carotid Doppler study and echocardiogram are unremarkable.   PT evaluation: Outpatient PT.   Continue aspirin and started lipitor.   ASA 81mg  daily per Dr. .  f/u hemoglobin A1c as outpatient.  * HLP. Started lipitor. LDL 123.  * Anxiety   Continue home medications. Headache. Ultram prn per Dr. Thad Ranger.  DISCHARGE CONDITIONS:  Stable, discharge to home today. CONSULTS OBTAINED:  Treatment Team:  Thad Ranger, MD DRUG ALLERGIES:  No Known Allergies DISCHARGE MEDICATIONS:   Allergies as of 11/29/2016   No Known Allergies     Medication List    TAKE these medications   acetaminophen-codeine 300-30 MG tablet Commonly known as:  TYLENOL #3 Take 1 tablet by mouth every 6 (six) hours as needed for moderate pain or severe pain. Take 1 tablet every 6 to 8 hours as needed for pain.   ALPRAZolam 1 MG tablet Commonly known as:  XANAX Take 1 mg by mouth 3 (three) times daily.   aspirin EC 81 MG tablet Take 1 tablet (81 mg total) by mouth daily.   atorvastatin 40 MG tablet Commonly known as:  LIPITOR Take 1 tablet (40 mg total) by mouth daily at 6 PM.   baclofen 20 MG tablet Commonly known as:  LIORESAL Take 1 tablet (20 mg total) by mouth 2 (two) times daily as needed for muscle spasms. What changed:  when to take this  reasons to take this   escitalopram 20 MG tablet Commonly known as:  LEXAPRO Take 40 mg by mouth daily.   spironolactone 50 MG tablet Commonly known  as:  ALDACTONE Take 150 mg by mouth daily.   traMADol 50 MG tablet Commonly known as:  ULTRAM Take 1 tablet (50 mg total) by mouth every 6 (six) hours as needed for moderate pain (headache).        DISCHARGE INSTRUCTIONS:  See AVS.   If you experience worsening of your admission symptoms, develop shortness of breath, life threatening emergency, suicidal or homicidal thoughts you must seek medical attention immediately by calling 911 or calling your MD immediately  if symptoms less severe.  You Must read complete instructions/literature along with all the possible adverse reactions/side effects for all the  Medicines you take and that have been prescribed to you. Take any new Medicines after you have completely understood and accpet all the possible adverse reactions/side effects.   Please note  You were cared for by a hospitalist during your hospital stay. If you have any questions about your discharge medications or the care you received while you were in the hospital after you are discharged, you can call the unit and asked to speak with the hospitalist on call if the hospitalist that took care of you is not available. Once you are discharged, your primary care physician will handle any further medical issues. Please note that NO REFILLS for any discharge medications will be authorized once you are discharged, as it is imperative that you return to your primary care physician (or establish a relationship with a primary care physician if you do not have one) for your aftercare needs so that they can reassess your need for medications and monitor your lab values.    On the day of Discharge:  VITAL SIGNS:  Blood pressure 123/70, pulse (!) 58, temperature 98.1 F (36.7 C), temperature source Oral, resp. rate 18, height 5\' 2"  (1.575 m), weight 150 lb (68 kg), SpO2 100 %. PHYSICAL EXAMINATION:  GENERAL:  61 y.o.-year-old patient lying in the bed with no acute distress.  EYES: Pupils equal, round, reactive to light and accommodation. No scleral icterus. Extraocular muscles intact.  HEENT: Head atraumatic, normocephalic. Oropharynx and nasopharynx clear.  NECK:  Supple, no jugular venous distention. No thyroid enlargement, no tenderness.  LUNGS: Normal breath sounds bilaterally, no wheezing, rales,rhonchi or crepitation. No use of accessory muscles of respiration.  CARDIOVASCULAR: S1, S2 normal. No murmurs, rubs, or gallops.  ABDOMEN: Soft, non-tender, non-distended. Bowel sounds present. No organomegaly or mass.  EXTREMITIES: No pedal edema, cyanosis, or clubbing.  NEUROLOGIC: Cranial nerves II through  XII are intact. Muscle strength 5/5 in all extremities. Sensation intact. Gait not checked.  PSYCHIATRIC: The patient is alert and oriented x 3.  SKIN: No obvious rash, lesion, or ulcer.  DATA REVIEW:   CBC  Recent Labs Lab 11/29/16 0635  WBC 6.7  HGB 15.0  HCT 44.6  PLT 280    Chemistries   Recent Labs Lab 11/28/16 1247 11/29/16 0635  NA 138 138  K 4.2 3.8  CL 105 102  CO2 26 28  GLUCOSE 101* 95  BUN 18 19  CREATININE 1.11* 1.02*  CALCIUM 9.4 9.3  AST 22  --   ALT 16  --   ALKPHOS 79  --   BILITOT 0.6  --      Microbiology Results  Results for orders placed or performed during the hospital encounter of 01/06/15  Surgical pcr screen     Status: Abnormal   Collection Time: 01/06/15 11:15 AM  Result Value Ref Range Status   MRSA, PCR NEGATIVE NEGATIVE Final  Staphylococcus aureus POSITIVE (A) NEGATIVE Final    Comment:        The Xpert SA Assay (FDA approved for NASAL specimens in patients over 21 years of age), is one component of a comprehensive surveillance program.  Test performance has been validated by [facility] for patients greater than or equal to 20 year old. It is not intended to diagnose infection nor to guide or monitor treatment.     RADIOLOGY:  Mr Shirlee Latch MW Contrast  Result Date: 11/29/2016 CLINICAL DATA:  Left-sided weakness EXAM: MRI HEAD WITHOUT CONTRAST MRA HEAD WITHOUT CONTRAST TECHNIQUE: Multiplanar, multiecho pulse sequences of the brain and surrounding structures were obtained without intravenous contrast. Angiographic images of the head were obtained using MRA technique without contrast. COMPARISON:  CT head 11/28/2016 FINDINGS: MRI HEAD FINDINGS Brain: Negative for acute infarct. Minimal chronic white matter changes. Brainstem and cerebellum normal. Negative for hemorrhage or mass lesion. Negative for hydrocephalus. Vascular: Normal arterial flow voids. Skull and upper cervical spine: Negative Sinuses/Orbits: Negative Other: None  MRA HEAD FINDINGS Both vertebral arteries widely patent. Basilar widely patent. AICA, and posterior cerebral arteries widely patent. Cavernous carotid patent bilaterally. Signal loss in the anterior genu of the right internal carotid artery likely is artifactual. Anterior middle cerebral arteries widely patent without stenosis or aneurysm. IMPRESSION: Essentially negative MRI of the head Signal loss in the anterior genu the right internal carotid artery most likely is artifact however atherosclerotic narrowing not excluded. No other evidence of atherosclerotic intracranial disease. Electronically Signed   By: Marlan Palau M.D.   On: 11/29/2016 11:27   Mr Brain Wo Contrast  Result Date: 11/29/2016 CLINICAL DATA:  Left-sided weakness EXAM: MRI HEAD WITHOUT CONTRAST MRA HEAD WITHOUT CONTRAST TECHNIQUE: Multiplanar, multiecho pulse sequences of the brain and surrounding structures were obtained without intravenous contrast. Angiographic images of the head were obtained using MRA technique without contrast. COMPARISON:  CT head 11/28/2016 FINDINGS: MRI HEAD FINDINGS Brain: Negative for acute infarct. Minimal chronic white matter changes. Brainstem and cerebellum normal. Negative for hemorrhage or mass lesion. Negative for hydrocephalus. Vascular: Normal arterial flow voids. Skull and upper cervical spine: Negative Sinuses/Orbits: Negative Other: None MRA HEAD FINDINGS Both vertebral arteries widely patent. Basilar widely patent. AICA, and posterior cerebral arteries widely patent. Cavernous carotid patent bilaterally. Signal loss in the anterior genu of the right internal carotid artery likely is artifactual. Anterior middle cerebral arteries widely patent without stenosis or aneurysm. IMPRESSION: Essentially negative MRI of the head Signal loss in the anterior genu the right internal carotid artery most likely is artifact however atherosclerotic narrowing not excluded. No other evidence of atherosclerotic  intracranial disease. Electronically Signed   By: Marlan Palau M.D.   On: 11/29/2016 11:27   US Carotid Bilateral  Result Date: 11/29/2016 CLINICAL DATA:  61 year old female with a history of left-sided weakness EXAM: BILATERAL CAROTID DUPLEX ULTRASOUND TECHNIQUE: Wallace Cullens scale imaging, color Doppler and duplex ultrasound were performed of bilateral carotid and vertebral arteries in the neck. COMPARISON:  Brain MRI 11/29/2016 FINDINGS: Criteria: Quantification of carotid stenosis is based on velocity parameters that correlate the residual internal carotid diameter with NASCET-based stenosis levels, using the diameter of the distal internal carotid lumen as the denominator for stenosis measurement. The following velocity measurements were obtained: RIGHT ICA:  65/12 cm/sec CCA:  118/21 cm/sec SYSTOLIC ICA/CCA RATIO:  0.6 DIASTOLIC ICA/CCA RATIO:  0.6 ECA:  113 cm/sec LEFT ICA:  88/26 cm/sec CCA:  119/30 cm/sec SYSTOLIC ICA/CCA RATIO:  0.7 DIASTOLIC ICA/CCA  RATIO:  0.9 ECA:  108 cm/sec RIGHT CAROTID ARTERY: No significant atherosclerotic plaque or evidence of stenosis. The spectral Doppler waveforms are normal in morphology. No findings to suggest distal (intracranial) occlusion or high-grade stenosis. RIGHT VERTEBRAL ARTERY: The vertebral artery is patent with normal antegrade flow. LEFT CAROTID ARTERY: Mild heterogeneous atherosclerotic plaque in the proximal internal carotid artery. By peak systolic velocity criteria the estimated stenosis remains less than 50%. LEFT VERTEBRAL ARTERY:  Patent with normal antegrade flow. IMPRESSION: 1. No evidence of significant stenosis or plaque in the right internal carotid artery. 2. Mild (1-49%) stenosis proximal left internal carotid artery secondary to heterogenous atherosclerotic plaque. 3. Vertebral arteries are patent with normal antegrade flow. Signed, Sterling Big, MD [facility] Electronically Signed    By: Malachy Moan M.D.   On: 11/29/2016 12:15     Management plans discussed with the patient, family and they are in agreement.  CODE STATUS: Full Code   TOTAL TIME TAKING CARE OF THIS PATIENT: 25 minutes.    Shaune Pollack M.D on 11/29/2016 at 2:14 PM  Between 7am to 6pm - Pager - [phone]  After 6pm go to [email or web address] - Social research officer, government  [facility]  Office  [phone]  CC: Primary care physician; Dortha Kern, MD   Note: This dictation was prepared with Dragon dictation along with smaller phrase technology. Any transcriptional errors that result from this process are unintentional.

## 2016-11-29 NOTE — Evaluation (Signed)
Physical Therapy Evaluation Patient Details Name: Donna Marsh MRN: [id number] DOB: June 09, 1956 Today's Date: 11/29/2016   History of Present Illness  Pt admitted for TIA. Pt with complaints of L sided weakness with history of anemia, anxiety, and depression.   Clinical Impression  Pt is a pleasant 61 year old female who was admitted for TIA. MRI and other imaging pending at this time. Pt performs bed mobility/transfers with independence and ambulation with cga without use of AD. Strength testing somewhat impaired with L side demonstrating weakness, however inconsistent; unsure of effort. Pt observed adjusting socks without difficulty. Pt demonstrates deficits with L side strength/sensation/coordination deficits with LE more affected compared to UE. Pt is not at baseline level at this time. Would benefit from skilled PT to address above deficits and promote optimal return to PLOF. Recommend OP PT for further treatment of impairments.      Follow Up Recommendations Outpatient PT;Supervision for mobility/OOB    Equipment Recommendations  None recommended by PT    Recommendations for Other Services OT consult     Precautions / Restrictions Precautions Precautions: Fall Restrictions Weight Bearing Restrictions: No      Mobility  Bed Mobility Overal bed mobility: Independent             General bed mobility comments: safe technique with good use of L side for support. Once sitting at EOB, able to sit with upright posture  Transfers Overall transfer level: Independent Equipment used: None             General transfer comment: upright posture noted with safe technique. No AD required  Ambulation/Gait Ambulation/Gait assistance: Min guard Ambulation Distance (Feet): 80 Feet Assistive device: None Gait Pattern/deviations: Step-to pattern;Decreased step length - left;Decreased dorsiflexion - left;Decreased weight shift to left     General Gait Details: ambulated  using decreased stance time on L LE with decreased step length. Slow gait speed with unsteadiness present. No formal LOB noted, however unsteadiness present needing CGA for safety. Pt complains of dizziness with mobility.  Stairs            Wheelchair Mobility    Modified Rankin (Stroke Patients Only)       Balance Overall balance assessment: Needs assistance Sitting-balance support: Feet unsupported;Single extremity supported Sitting balance-Leahy Scale: Good     Standing balance support: No upper extremity supported Standing balance-Leahy Scale: Fair Standing balance comment: takes minimal pertabation for LOB                             Pertinent Vitals/Pain Pain Assessment: No/denies pain    Home Living Family/patient expects to be discharged to:: Private residence Living Arrangements: Spouse/significant other;Children Available Help at Discharge: Family Type of Home: House Home Access: Stairs to enter Entrance Stairs-Rails: None Entrance Stairs-Number of Steps: 2 Home Layout: Able to live on main level with bedroom/bathroom;Two level Home Equipment: Walker - 2 wheels;Cane - single point      Prior Function Level of Independence: Independent         Comments: retired, however very active at baseline     Hand Dominance        Extremity/Trunk Assessment   Upper Extremity Assessment Upper Extremity Assessment: LUE deficits/detail LUE Deficits / Details: grip/flexion/extension grossly 3+/5; however inconsistent. Reports occasional numbness in L arm, however none at this time. LUE Coordination: decreased gross motor (slight decreased with RAMP pronation/supination)    Lower Extremity Assessment Lower Extremity Assessment: LLE  deficits/detail LLE Deficits / Details: dorsiflexion 3/5; knee flexion/ext 4/5. Pt inconsistent with testing LLE Sensation: decreased light touch LLE Coordination: decreased gross motor (unable to perform toe taps;  heel slide WNL)       Communication   Communication: No difficulties  Cognition Arousal/Alertness: Awake/alert Behavior During Therapy: WFL for tasks assessed/performed Overall Cognitive Status: Within Functional Limits for tasks assessed                                        General Comments      Exercises     Assessment/Plan    PT Assessment Patient needs continued PT services  PT Problem List Decreased strength;Decreased balance;Decreased mobility;Decreased coordination;Impaired sensation       PT Treatment Interventions Gait training;Stair training;Therapeutic exercise;Neuromuscular re-education;Balance training    PT Goals (Current goals can be found in the Care Plan section)  Acute Rehab PT Goals Patient Stated Goal: to get stronger PT Goal Formulation: With patient Time For Goal Achievement: 12/13/16 Potential to Achieve Goals: Good    Frequency 7X/week   Barriers to discharge        Co-evaluation               AM-PAC PT "6 Clicks" Daily Activity  Outcome Measure Difficulty turning over in bed (including adjusting bedclothes, sheets and blankets)?: None Difficulty moving from lying on back to sitting on the side of the bed? : None Difficulty sitting down on and standing up from a chair with arms (e.g., wheelchair, bedside commode, etc,.)?: A Little Help needed moving to and from a bed to chair (including a wheelchair)?: A Little Help needed walking in hospital room?: A Little Help needed climbing 3-5 steps with a railing? : A Little 6 Click Score: 20    End of Session Equipment Utilized During Treatment: Gait belt Activity Tolerance: Patient tolerated treatment well Patient left: in bed Nurse Communication: Mobility status PT Visit Diagnosis: Unsteadiness on feet (R26.81);Muscle weakness (generalized) (M62.81);Hemiplegia and hemiparesis Hemiplegia - Right/Left: Left Hemiplegia - dominant/non-dominant: Non-dominant Hemiplegia -  caused by: Unspecified    Time: 0852-0905 PT Time Calculation (min) (ACUTE ONLY): 13 min   Charges:   PT Evaluation $PT Eval Low Complexity: 1 Procedure     PT G Codes:   PT G-Codes **NOT FOR INPATIENT CLASS** Functional Assessment Tool Used: AM-PAC 6 Clicks Basic Mobility Functional Limitation: Mobility: Walking and moving around Mobility: Walking and Moving Around Current Status (H9093): At least 20 percent but less than 40 percent impaired, limited or restricted Mobility: Walking and Moving Around Goal Status [phone]): At least 1 percent but less than 20 percent impaired, limited or restricted    Elizabeth Palau, PT, DPT [phone]   Vastie Douty 11/29/2016, 9:39 AM

## 2016-11-29 NOTE — Progress Notes (Signed)
Pt is being discharged today, discharge instructions reviewed with the patient, she verified understanding. 3 paper prescriptions were given to her. All belongings packed and returned to the patient. She is currently waiting on her ride then she will be rolled out in a wheel chair by staff.

## 2016-11-29 NOTE — Discharge Instructions (Signed)
Heart healthy diet. Outpatient PT.

## 2016-11-29 NOTE — Progress Notes (Signed)
Subjective: Patient reports that left sided symptoms continue and also reports a headache.  Describes the headache as holocranial.  Sharp and dull.  Rates pain at a 9/10.     Objective: Current vital signs: BP 123/70 (BP Location: Left Arm)   Pulse (!) 58   Temp 98.1 F (36.7 C) (Oral)   Resp 18   Ht _0  (1.575 m)   Wt 68 kg (150 lb)   SpO2 100%   BMI 27.44 kg/m  Vital signs in last 24 hours: Temp:  [97.8 F (36.6 C)-98.2 F (36.8 C)] 98.1 F (36.7 C) (05/08 0521) Pulse Rate:  [58-76] 58 (05/08 0521) Resp:  [10-21] 18 (05/08 0521) BP: (115-136)/(67-98) 123/70 (05/08 0521) SpO2:  [95 %-100 %] 100 % (05/08 0521)  Intake/Output from previous day: 05/07 0701 - 05/08 0700 In: 240 [P.O.:240] Out: -  Intake/Output this shift: Total I/O In: 360 [P.O.:360] Out: -  Nutritional status: Diet Heart Room service appropriate? Yes; Fluid consistency: Thin  Neurologic Exam: Mental Status: Alert, oriented, thought content appropriate.  Speech fluent without evidence of aphasia.  Able to follow 3 step commands without difficulty. Cranial Nerves: II: Discs flat bilaterally; Visual fields grossly normal, pupils equal, round, reactive to light and accommodation III,IV, VI: ptosis not present, extra-ocular motions intact bilaterally V,VII: smile symmetric, facial light touch sensation decreased on the left VIII: hearing normal bilaterally IX,X: gag reflex present XI: bilateral shoulder shrug XII: midline tongue extension Motor: Right :  Upper extremity   5/5                                      Left:     Upper extremity   5/5             Lower extremity   5/5                                                  Lower extremity   5-/5 Tone and bulk:normal tone throughout; no atrophy noted Sensory: Pinprick and light touch decreased in the LUE and LLE Deep Tendon Reflexes: 2+ in the BUE's and at the right KJ.  DTR's absent otherwise.   Plantars: Right: mute                               Left: mute   Lab Results: Basic Metabolic Panel:  Recent Labs Lab 11/28/16 1247 11/29/16 0635  NA 138 138  K 4.2 3.8  CL 105 102  CO2 26 28  GLUCOSE 101* 95  BUN 18 19  CREATININE 1.11* 1.02*  CALCIUM 9.4 9.3    Liver Function Tests:  Recent Labs Lab 11/28/16 1247  AST 22  ALT 16  ALKPHOS 79  BILITOT 0.6  PROT 8.6*  ALBUMIN 4.6   No results for input(s): LIPASE, AMYLASE in the last 168 hours. No results for input(s): AMMONIA in the last 168 hours.  CBC:  Recent Labs Lab 11/28/16 1247 11/29/16 0635  WBC 6.8 6.7  NEUTROABS 4.7  --   HGB 14.6 15.0  HCT 45.1 44.6  MCV 94.3 93.5  PLT 268 280    Cardiac Enzymes:  Recent Labs Lab 11/28/16 1247  TROPONINI <0.03  Lipid Panel:  Recent Labs Lab 11/28/16 1247  CHOL 189  TRIG 128  HDL 40*  CHOLHDL 4.7  VLDL 26  LDLCALC 123*    CBG:  Recent Labs Lab 11/28/16 1244  GLUCAP 88    Microbiology: Results for orders placed or performed during the hospital encounter of 01/06/15  Surgical pcr screen     Status: Abnormal   Collection Time: 01/06/15 11:15 AM  Result Value Ref Range Status   MRSA, PCR NEGATIVE NEGATIVE Final   Staphylococcus aureus POSITIVE (A) NEGATIVE Final    Comment:        The Xpert SA Assay (FDA approved for NASAL specimens in patients over 9 years of age), is one component of a comprehensive surveillance program.  Test performance has been validated by [facility] for patients greater than or equal to 58 year old. It is not intended to diagnose infection nor to guide or monitor treatment.     Coagulation Studies:  Recent Labs  11/28/16 1247  LABPROT 13.4  INR 1.02    Imaging: Mr Virgel Paling RA Contrast  Result Date: 11/29/2016 CLINICAL DATA:  Left-sided weakness EXAM: MRI HEAD WITHOUT CONTRAST MRA HEAD WITHOUT CONTRAST TECHNIQUE: Multiplanar, multiecho pulse sequences of the brain and surrounding structures were obtained without intravenous contrast.  Angiographic images of the head were obtained using MRA technique without contrast. COMPARISON:  CT head 11/28/2016 FINDINGS: MRI HEAD FINDINGS Brain: Negative for acute infarct. Minimal chronic white matter changes. Brainstem and cerebellum normal. Negative for hemorrhage or mass lesion. Negative for hydrocephalus. Vascular: Normal arterial flow voids. Skull and upper cervical spine: Negative Sinuses/Orbits: Negative Other: None MRA HEAD FINDINGS Both vertebral arteries widely patent. Basilar widely patent. AICA, and posterior cerebral arteries widely patent. Cavernous carotid patent bilaterally. Signal loss in the anterior genu of the right internal carotid artery likely is artifactual. Anterior middle cerebral arteries widely patent without stenosis or aneurysm. IMPRESSION: Essentially negative MRI of the head Signal loss in the anterior genu the right internal carotid artery most likely is artifact however atherosclerotic narrowing not excluded. No other evidence of atherosclerotic intracranial disease. Electronically Signed   By: Franchot Gallo M.D.   On: 11/29/2016 11:27   Mr Brain Wo Contrast  Result Date: 11/29/2016 CLINICAL DATA:  Left-sided weakness EXAM: MRI HEAD WITHOUT CONTRAST MRA HEAD WITHOUT CONTRAST TECHNIQUE: Multiplanar, multiecho pulse sequences of the brain and surrounding structures were obtained without intravenous contrast. Angiographic images of the head were obtained using MRA technique without contrast. COMPARISON:  CT head 11/28/2016 FINDINGS: MRI HEAD FINDINGS Brain: Negative for acute infarct. Minimal chronic white matter changes. Brainstem and cerebellum normal. Negative for hemorrhage or mass lesion. Negative for hydrocephalus. Vascular: Normal arterial flow voids. Skull and upper cervical spine: Negative Sinuses/Orbits: Negative Other: None MRA HEAD FINDINGS Both vertebral arteries widely patent. Basilar widely patent. AICA, and posterior cerebral arteries widely patent. Cavernous  carotid patent bilaterally. Signal loss in the anterior genu of the right internal carotid artery likely is artifactual. Anterior middle cerebral arteries widely patent without stenosis or aneurysm. IMPRESSION: Essentially negative MRI of the head Signal loss in the anterior genu the right internal carotid artery most likely is artifact however atherosclerotic narrowing not excluded. No other evidence of atherosclerotic intracranial disease. Electronically Signed   By: Franchot Gallo M.D.   On: 11/29/2016 11:27   US Carotid Bilateral  Result Date: 11/29/2016 CLINICAL DATA:  61 year old female with a history of left-sided weakness EXAM: BILATERAL CAROTID DUPLEX ULTRASOUND TECHNIQUE: Pearline Cables scale imaging,  color Doppler and duplex ultrasound were performed of bilateral carotid and vertebral arteries in the neck. COMPARISON:  Brain MRI 11/29/2016 FINDINGS: Criteria: Quantification of carotid stenosis is based on velocity parameters that correlate the residual internal carotid diameter with NASCET-based stenosis levels, using the diameter of the distal internal carotid lumen as the denominator for stenosis measurement. The following velocity measurements were obtained: RIGHT ICA:  65/12 cm/sec CCA:  196/22 cm/sec SYSTOLIC ICA/CCA RATIO:  0.6 DIASTOLIC ICA/CCA RATIO:  0.6 ECA:  113 cm/sec LEFT ICA:  88/26 cm/sec CCA:  297/98 cm/sec SYSTOLIC ICA/CCA RATIO:  0.7 DIASTOLIC ICA/CCA RATIO:  0.9 ECA:  108 cm/sec RIGHT CAROTID ARTERY: No significant atherosclerotic plaque or evidence of stenosis. The spectral Doppler waveforms are normal in morphology. No findings to suggest distal (intracranial) occlusion or high-grade stenosis. RIGHT VERTEBRAL ARTERY: The vertebral artery is patent with normal antegrade flow. LEFT CAROTID ARTERY: Mild heterogeneous atherosclerotic plaque in the proximal internal carotid artery. By peak systolic velocity criteria the estimated stenosis remains less than 50%. LEFT VERTEBRAL ARTERY:  Patent with  normal antegrade flow. IMPRESSION: 1. No evidence of significant stenosis or plaque in the right internal carotid artery. 2. Mild (1-49%) stenosis proximal left internal carotid artery secondary to heterogenous atherosclerotic plaque. 3. Vertebral arteries are patent with normal antegrade flow. Signed, Criselda Peaches, MD [facility] Radiology Electronically Signed   By: Jacqulynn Cadet M.D.   On: 11/29/2016 12:15   Ct Head Code Stroke W/o Cm  Result Date: 11/28/2016 CLINICAL DATA:  Code stroke.  Left-sided numbness.  Dizziness. EXAM: CT HEAD WITHOUT CONTRAST TECHNIQUE: Contiguous axial images were obtained from the base of the skull through the vertex without intravenous contrast. COMPARISON:  Brain MRI 08/02/2012 FINDINGS: Brain: There is no evidence of acute infarct, intracranial hemorrhage, mass, midline shift, or extra-axial fluid collection. There is mild cerebral atrophy. Vascular: Mild bilateral carotid siphon calcified atherosclerosis. No hyperdense vessel. Skull: No evidence of fracture or focal osseous lesion. Sinuses/Orbits: Visualized paranasal sinuses and mastoid air cells are clear. Visualized orbits are unremarkable. Other: None. ASPECTS [facility] Stroke Program Early CT Score) - Ganglionic level infarction (caudate, lentiform nuclei, internal capsule, insula, M1-M3 cortex): 7 - Supraganglionic infarction (M4-M6 cortex): 3 Total score (0-10 with 10 being normal): 10 IMPRESSION: 1. No evidence of acute intracranial abnormality. 2. ASPECTS is 10. These results were called by telephone at the time of interpretation on 11/28/2016 at 12:48 pm to Dr. Charlotte Crumb , who verbally acknowledged these results. Electronically Signed   By: Logan Bores M.D.   On: 11/28/2016 12:49    Medications:  I have reviewed the patient's current medications. Scheduled: . ALPRAZolam  1 mg Oral TID  . atorvastatin  40 mg Oral q1800  . baclofen  20 mg Oral BID  .  escitalopram  40 mg Oral Daily  . heparin  5,000 Units Subcutaneous Q8H  . spironolactone  150 mg Oral Daily    Assessment/Plan: Patient unchanged.  Complains of headache.  MRI of the brain reviewed and shows no acute changes.  Carotid dopplers show no evidence of hemodynamically significant stenosis.  Echocardiogram shows no cardiac source of emboli with an EF of 55-65%.  A1c 5.4, LDL 123. Symptom may very well represent a complicated migraine.    Recommendations: 1.  ASA 28m daily 2.  Patient reports no significant improvement in headaches since admission despite Fioricet and Tylenol with codeine.  Would consider Ultram prn 3.  ESR   LOS: 0  days   Alexis Goodell, MD Neurology [phone] 11/29/2016  1:09 PM

## 2016-11-29 NOTE — Evaluation (Signed)
Occupational Therapy Evaluation Patient Details Name: Donna Marsh MRN: [id number] DOB: May 16, 1956 Today's Date: 11/29/2016    History of Present Illness Pt admitted for TIA. Pt with complaints of L sided weakness with history of anemia, anxiety, and depression.    Clinical Impression   Pt seen for OT evaluation this date. All stroke work up/testing was negative/unremarkable. Pt was independent at baseline, presenting with hx of multiple falls in past 12 mo, numbness/tingling in LUE that comes and goes, decreased strength on L side, decreased coordination and increased effort needed to perform fine motor/coordination tasks using L hand. Pt reports 10/10 headache. Pt eager to return to PLOF. Recommend OP OT to address impairments to maximize functional return to PLOF and minimize falls risk, injury, and rehospitalization. No additional acute care needs at this time. Will sign off.    Follow Up Recommendations  Outpatient OT    Equipment Recommendations  None recommended by OT    Recommendations for Other Services       Precautions / Restrictions Precautions Precautions: Fall Restrictions Weight Bearing Restrictions: No      Mobility Bed Mobility Overal bed mobility: Independent                Transfers Overall transfer level: Independent                    Balance                                           ADL either performed or assessed with clinical judgement   ADL Overall ADL's : Needs assistance/impaired Eating/Feeding: Set up;Sitting   Grooming: Supervision/safety;Standing   Upper Body Bathing: Sitting;Set up   Lower Body Bathing: Set up;Sit to/from stand;Supervison/ safety   Upper Body Dressing : Sitting;Set up   Lower Body Dressing: Supervision/safety;Set up;Sit to/from stand                 General ADL Comments: pt generally at supervision level for self care tasks     Vision   Eye Alignment: Within  Functional Limits Convergence: Impaired - to be further tested in functional context Additional Comments: all other visual testing WFL/no impairments noted     Perception     Praxis      Pertinent Vitals/Pain Pain Assessment: 0-10 Pain Score: 10-Worst pain ever Pain Location: headache Pain Descriptors / Indicators: Aching Pain Intervention(s): Limited activity within patient's tolerance;Monitored during session     Hand Dominance Right   Extremity/Trunk Assessment Upper Extremity Assessment Upper Extremity Assessment: LUE deficits/detail LUE Deficits / Details: grip/flexion/extension grossly 3+/5; however inconsistent. Reports occasional numbness/tingling in L arm, during session had numbness/tingling from elbow down to fingers LUE Sensation: decreased light touch LUE Coordination: decreased gross motor;decreased fine motor (slightly decreased with RAMP pronation/supination and finger to thumb opposition)   Lower Extremity Assessment Lower Extremity Assessment: Defer to PT evaluation;LLE deficits/detail       Communication Communication Communication: No difficulties   Cognition Arousal/Alertness: Awake/alert Behavior During Therapy: WFL for tasks assessed/performed Overall Cognitive Status: Within Functional Limits for tasks assessed                                     General Comments       Exercises Other Exercises Other Exercises: pt educated in  falls prevention, home/routines modifications to support functional independence, safety, and falls prevention in the home Other Exercises: pt endorses PRN dizziness when first sitting up in bed in the morning; educated pt in strategies to minimize risk of falls due to [facility]   Shoulder Instructions      Home Living Family/patient expects to be discharged to:: Private residence Living Arrangements: Spouse/significant other;Children (15yo son) Available Help at Discharge: Family Type of Home: House Home  Access: Stairs to enter Secretary/administrator of Steps: 2 Entrance Stairs-Rails: None Home Layout: Able to live on main level with bedroom/bathroom;Two level Alternate Level Stairs-Number of Steps: 12 Alternate Level Stairs-Rails: Can reach both Bathroom Shower/Tub: Chief Strategy Officer: Handicapped height Bathroom Accessibility: Yes How Accessible: Accessible via wheelchair;Accessible via walker Home Equipment: Walker - 2 wheels;Cane - single point;Grab bars - tub/shower;Shower seat   Additional Comments: access to equipment above, spouse uses      Prior Functioning/Environment Level of Independence: Independent        Comments: retired, however very active at baseline, enjoys gardening; endorses 4-5 falls in past 12 months (fell up front steps, fell walking the dog, fell back trying to get into tall truck)        OT Problem List:        OT Treatment/Interventions:      OT Goals(Current goals can be found in the care plan section) Acute Rehab OT Goals Patient Stated Goal: to get stronger OT Goal Formulation: With patient Time For Goal Achievement: 12/13/16 Potential to Achieve Goals: Good  OT Frequency:     Barriers to D/C:            Co-evaluation              AM-PAC PT "6 Clicks" Daily Activity     Outcome Measure Help from another person eating meals?: None Help from another person taking care of personal grooming?: A Little Help from another person toileting, which includes using toliet, bedpan, or urinal?: A Little Help from another person bathing (including washing, rinsing, drying)?: A Little Help from another person to put on and taking off regular upper body clothing?: None Help from another person to put on and taking off regular lower body clothing?: A Little 6 Click Score: 20   End of Session    Activity Tolerance: Patient tolerated treatment well Patient left: in bed;with call bell/phone within reach;with bed alarm set  OT  Visit Diagnosis: Hemiplegia and hemiparesis;History of falling (Z91.81);Repeated falls (R29.6);Muscle weakness (generalized) (M62.81);Dizziness and giddiness (R42);Pain Hemiplegia - Right/Left: Left Hemiplegia - dominant/non-dominant: Non-Dominant Pain - part of body:  (headache)                Time: 1433-1500 OT Time Calculation (min): 27 min Charges:  OT General Charges $OT Visit: 1 Procedure OT Evaluation $OT Eval Low Complexity: 1 Procedure OT Treatments $Self Care/Home Management : 8-22 mins G-Codes: OT G-codes **NOT FOR INPATIENT CLASS** Functional Assessment Tool Used: Clinical judgement;AM-PAC 6 Clicks Daily Activity Functional Limitation: Self care Self Care Current Status (I3382): At least 20 percent but less than 40 percent impaired, limited or restricted Self Care Goal Status (N0539): At least 20 percent but less than 40 percent impaired, limited or restricted Self Care Discharge Status [phone]): At least 20 percent but less than 40 percent impaired, limited or restricted   Richrd Prime, MPH, MS, OTR/L ascom [phone] 11/29/16, 3:13 PM

## 2016-11-29 NOTE — Progress Notes (Signed)
CH made a follow up visit. Pt was out of room for tests. Husband was in. Husband stated it was an unrestful night for the Pt. Husband suggests the anxiety of being away from home and upcoming tests could be the problem. Husband appreciated the visit and did not indicate a need for spiritual care at this time.    11/29/16 1100  Clinical Encounter Type  Visited With Family;Patient not available  Visit Type Follow-up;Spiritual support  Consult/Referral To Chaplain

## 2016-11-29 NOTE — Care Management (Signed)
Physical therapy evaluation completed. Recommending outpatient therapy. Ms. Pesch undecided. Agrees to have referral faxed down stairs to rehabilitation department. Will have made a decision when secretary calls her for appointment. Discharge to home today per Dr. Reina Fuse RN MSN CCM Care Management [phone]

## 2016-11-29 NOTE — Progress Notes (Signed)
OT Cancellation Note  Patient Details Name: DHYANA BASTONE MRN: [id number] DOB: 1956/01/01   Cancelled Treatment:    Reason Eval/Treat Not Completed: Patient at procedure or test/ unavailable. Pt out of room for testing. Will re-attempt OT evaluation at later date/time as pt is available.  Richrd Prime, MPH, MS, OTR/L ascom [phone] 11/29/16, 10:36 AM

## 2016-11-30 LAB — HIV ANTIBODY (ROUTINE TESTING W REFLEX): HIV Screen 4th Generation wRfx: NONREACTIVE

## 2016-12-07 ENCOUNTER — Other Ambulatory Visit: Payer: Self-pay | Admitting: Orthopedic Surgery

## 2016-12-07 DIAGNOSIS — M5416 Radiculopathy, lumbar region: Secondary | ICD-10-CM

## 2016-12-20 ENCOUNTER — Ambulatory Visit
Admission: RE | Admit: 2016-12-20 | Discharge: 2016-12-20 | Disposition: A | Discharge status: Home / Self Care | Payer: Managed Care, Other (non HMO) | Source: Ambulatory Visit | Attending: Orthopedic Surgery | Admitting: Orthopedic Surgery

## 2016-12-20 DIAGNOSIS — M5126 Other intervertebral disc displacement, lumbar region: Secondary | ICD-10-CM | POA: Diagnosis not present

## 2016-12-20 DIAGNOSIS — M5124 Other intervertebral disc displacement, thoracic region: Secondary | ICD-10-CM | POA: Insufficient documentation

## 2016-12-20 DIAGNOSIS — M5416 Radiculopathy, lumbar region: Secondary | ICD-10-CM | POA: Diagnosis present

## 2016-12-20 DIAGNOSIS — Z981 Arthrodesis status: Secondary | ICD-10-CM | POA: Diagnosis not present

## 2017-02-01 ENCOUNTER — Other Ambulatory Visit: Payer: Self-pay | Admitting: Family Medicine

## 2017-02-01 DIAGNOSIS — N281 Cyst of kidney, acquired: Secondary | ICD-10-CM

## 2017-02-06 ENCOUNTER — Ambulatory Visit
Admission: RE | Admit: 2017-02-06 | Discharge: 2017-02-06 | Disposition: A | Discharge status: Home / Self Care | Payer: Managed Care, Other (non HMO) | Source: Ambulatory Visit | Attending: Family Medicine | Admitting: Family Medicine

## 2017-02-06 DIAGNOSIS — N281 Cyst of kidney, acquired: Secondary | ICD-10-CM | POA: Insufficient documentation

## 2018-03-30 ENCOUNTER — Emergency Department: Payer: Medicare Other

## 2018-03-30 ENCOUNTER — Inpatient Hospital Stay
Admission: EM | Admit: 2018-03-30 | Discharge: 2018-04-05 | DRG: 871 | Disposition: A | Discharge status: Home Health | Payer: Medicare Other | Attending: Internal Medicine | Admitting: Internal Medicine

## 2018-03-30 ENCOUNTER — Other Ambulatory Visit: Payer: Self-pay

## 2018-03-30 DIAGNOSIS — G9341 Metabolic encephalopathy: Secondary | ICD-10-CM | POA: Diagnosis not present

## 2018-03-30 DIAGNOSIS — Z8673 Personal history of transient ischemic attack (TIA), and cerebral infarction without residual deficits: Secondary | ICD-10-CM

## 2018-03-30 DIAGNOSIS — K529 Noninfective gastroenteritis and colitis, unspecified: Secondary | ICD-10-CM | POA: Diagnosis present

## 2018-03-30 DIAGNOSIS — M549 Dorsalgia, unspecified: Secondary | ICD-10-CM | POA: Diagnosis present

## 2018-03-30 DIAGNOSIS — Z8249 Family history of ischemic heart disease and other diseases of the circulatory system: Secondary | ICD-10-CM

## 2018-03-30 DIAGNOSIS — R402314 Coma scale, best motor response, none, 24 hours or more after hospital admission: Secondary | ICD-10-CM | POA: Diagnosis present

## 2018-03-30 DIAGNOSIS — R4182 Altered mental status, unspecified: Secondary | ICD-10-CM

## 2018-03-30 DIAGNOSIS — X58XXXA Exposure to other specified factors, initial encounter: Secondary | ICD-10-CM | POA: Diagnosis present

## 2018-03-30 DIAGNOSIS — Z87440 Personal history of urinary (tract) infections: Secondary | ICD-10-CM | POA: Diagnosis not present

## 2018-03-30 DIAGNOSIS — E7800 Pure hypercholesterolemia, unspecified: Secondary | ICD-10-CM | POA: Diagnosis present

## 2018-03-30 DIAGNOSIS — F329 Major depressive disorder, single episode, unspecified: Secondary | ICD-10-CM | POA: Diagnosis present

## 2018-03-30 DIAGNOSIS — Z9049 Acquired absence of other specified parts of digestive tract: Secondary | ICD-10-CM

## 2018-03-30 DIAGNOSIS — L659 Nonscarring hair loss, unspecified: Secondary | ICD-10-CM | POA: Diagnosis present

## 2018-03-30 DIAGNOSIS — G8929 Other chronic pain: Secondary | ICD-10-CM | POA: Diagnosis present

## 2018-03-30 DIAGNOSIS — M1990 Unspecified osteoarthritis, unspecified site: Secondary | ICD-10-CM | POA: Diagnosis present

## 2018-03-30 DIAGNOSIS — R402214 Coma scale, best verbal response, none, 24 hours or more after hospital admission: Secondary | ICD-10-CM | POA: Diagnosis present

## 2018-03-30 DIAGNOSIS — K219 Gastro-esophageal reflux disease without esophagitis: Secondary | ICD-10-CM | POA: Diagnosis present

## 2018-03-30 DIAGNOSIS — A419 Sepsis, unspecified organism: Principal | ICD-10-CM | POA: Diagnosis present

## 2018-03-30 DIAGNOSIS — Q433 Congenital malformations of intestinal fixation: Secondary | ICD-10-CM | POA: Diagnosis not present

## 2018-03-30 DIAGNOSIS — Z90710 Acquired absence of both cervix and uterus: Secondary | ICD-10-CM

## 2018-03-30 DIAGNOSIS — F13239 Sedative, hypnotic or anxiolytic dependence with withdrawal, unspecified: Secondary | ICD-10-CM | POA: Diagnosis present

## 2018-03-30 DIAGNOSIS — F419 Anxiety disorder, unspecified: Secondary | ICD-10-CM | POA: Diagnosis present

## 2018-03-30 DIAGNOSIS — Z823 Family history of stroke: Secondary | ICD-10-CM

## 2018-03-30 DIAGNOSIS — R402342 Coma scale, best motor response, flexion withdrawal, at arrival to emergency department: Secondary | ICD-10-CM | POA: Diagnosis present

## 2018-03-30 DIAGNOSIS — E875 Hyperkalemia: Secondary | ICD-10-CM | POA: Diagnosis present

## 2018-03-30 DIAGNOSIS — Z79899 Other long term (current) drug therapy: Secondary | ICD-10-CM

## 2018-03-30 DIAGNOSIS — E872 Acidosis: Secondary | ICD-10-CM | POA: Diagnosis present

## 2018-03-30 DIAGNOSIS — E860 Dehydration: Secondary | ICD-10-CM | POA: Diagnosis present

## 2018-03-30 DIAGNOSIS — R402124 Coma scale, eyes open, to pain, 24 hours or more after hospital admission: Secondary | ICD-10-CM | POA: Diagnosis present

## 2018-03-30 DIAGNOSIS — N3281 Overactive bladder: Secondary | ICD-10-CM | POA: Diagnosis present

## 2018-03-30 DIAGNOSIS — Z96653 Presence of artificial knee joint, bilateral: Secondary | ICD-10-CM | POA: Diagnosis present

## 2018-03-30 DIAGNOSIS — E876 Hypokalemia: Secondary | ICD-10-CM | POA: Diagnosis present

## 2018-03-30 DIAGNOSIS — R402212 Coma scale, best verbal response, none, at arrival to emergency department: Secondary | ICD-10-CM | POA: Diagnosis present

## 2018-03-30 DIAGNOSIS — R402142 Coma scale, eyes open, spontaneous, at arrival to emergency department: Secondary | ICD-10-CM | POA: Diagnosis present

## 2018-03-30 DIAGNOSIS — T40605A Adverse effect of unspecified narcotics, initial encounter: Secondary | ICD-10-CM | POA: Diagnosis present

## 2018-03-30 DIAGNOSIS — Z803 Family history of malignant neoplasm of breast: Secondary | ICD-10-CM

## 2018-03-30 DIAGNOSIS — M069 Rheumatoid arthritis, unspecified: Secondary | ICD-10-CM | POA: Diagnosis present

## 2018-03-30 DIAGNOSIS — K5903 Drug induced constipation: Secondary | ICD-10-CM | POA: Diagnosis present

## 2018-03-30 DIAGNOSIS — N179 Acute kidney failure, unspecified: Secondary | ICD-10-CM | POA: Diagnosis present

## 2018-03-30 DIAGNOSIS — Z79891 Long term (current) use of opiate analgesic: Secondary | ICD-10-CM

## 2018-03-30 DIAGNOSIS — Z7982 Long term (current) use of aspirin: Secondary | ICD-10-CM

## 2018-03-30 DIAGNOSIS — I8392 Asymptomatic varicose veins of left lower extremity: Secondary | ICD-10-CM | POA: Diagnosis present

## 2018-03-30 HISTORY — DX: Noninfective gastroenteritis and colitis, unspecified: K52.9

## 2018-03-30 LAB — CBC WITH DIFFERENTIAL/PLATELET
Basophils Absolute: 0 10*3/uL (ref 0–0.1)
Basophils Relative: 0 %
EOS ABS: 0 10*3/uL (ref 0–0.7)
Eosinophils Relative: 0 %
HCT: 54.7 % — ABNORMAL HIGH (ref 35.0–47.0)
HEMOGLOBIN: 17.5 g/dL — AB (ref 12.0–16.0)
LYMPHS PCT: 5 %
Lymphs Abs: 1.1 10*3/uL (ref 1.0–3.6)
MCH: 29.3 pg (ref 26.0–34.0)
MCHC: 32 g/dL (ref 32.0–36.0)
MCV: 91.4 fL (ref 80.0–100.0)
Monocytes Absolute: 1.2 10*3/uL — ABNORMAL HIGH (ref 0.2–0.9)
Monocytes Relative: 5 %
NEUTROS ABS: 21.3 10*3/uL — AB (ref 1.4–6.5)
NEUTROS PCT: 90 %
Platelets: 390 10*3/uL (ref 150–440)
RBC: 5.98 MIL/uL — AB (ref 3.80–5.20)
RDW: 17.5 % — ABNORMAL HIGH (ref 11.5–14.5)
WBC: 23.7 10*3/uL — AB (ref 3.6–11.0)

## 2018-03-30 LAB — URINE DRUG SCREEN, QUALITATIVE (ARMC ONLY)
Amphetamines, Ur Screen: NOT DETECTED
Barbiturates, Ur Screen: NOT DETECTED
Benzodiazepine, Ur Scrn: POSITIVE — AB
COCAINE METABOLITE, UR ~~LOC~~: NOT DETECTED
Cannabinoid 50 Ng, Ur ~~LOC~~: NOT DETECTED
MDMA (ECSTASY) UR SCREEN: NOT DETECTED
METHADONE SCREEN, URINE: NOT DETECTED
OPIATE, UR SCREEN: POSITIVE — AB
Phencyclidine (PCP) Ur S: NOT DETECTED
TRICYCLIC, UR SCREEN: NOT DETECTED

## 2018-03-30 LAB — URINALYSIS, COMPLETE (UACMP) WITH MICROSCOPIC
Bacteria, UA: NONE SEEN
Bilirubin Urine: NEGATIVE
GLUCOSE, UA: NEGATIVE mg/dL
HGB URINE DIPSTICK: NEGATIVE
Ketones, ur: 5 mg/dL — AB
Leukocytes, UA: NEGATIVE
Nitrite: NEGATIVE
Protein, ur: NEGATIVE mg/dL
SPECIFIC GRAVITY, URINE: 1.017 (ref 1.005–1.030)
pH: 5 (ref 5.0–8.0)

## 2018-03-30 LAB — COMPREHENSIVE METABOLIC PANEL
ALBUMIN: 4 g/dL (ref 3.5–5.0)
ALT: 39 U/L (ref 0–44)
ANION GAP: 12 (ref 5–15)
AST: 65 U/L — ABNORMAL HIGH (ref 15–41)
Alkaline Phosphatase: 111 U/L (ref 38–126)
BUN: 33 mg/dL — ABNORMAL HIGH (ref 8–23)
CHLORIDE: 106 mmol/L (ref 98–111)
CO2: 17 mmol/L — AB (ref 22–32)
Calcium: 10.8 mg/dL — ABNORMAL HIGH (ref 8.9–10.3)
Creatinine, Ser: 1.59 mg/dL — ABNORMAL HIGH (ref 0.44–1.00)
GFR calc Af Amer: 39 mL/min — ABNORMAL LOW (ref >60)
GFR calc non Af Amer: 34 mL/min — ABNORMAL LOW (ref >60)
Glucose, Bld: 197 mg/dL — ABNORMAL HIGH (ref 70–99)
POTASSIUM: 5.4 mmol/L — AB (ref 3.5–5.1)
SODIUM: 135 mmol/L (ref 135–145)
TOTAL PROTEIN: 8.1 g/dL (ref 6.5–8.1)
Total Bilirubin: 1.6 mg/dL — ABNORMAL HIGH (ref 0.3–1.2)

## 2018-03-30 LAB — C DIFFICILE QUICK SCREEN W PCR REFLEX
C Diff antigen: NEGATIVE
C Diff interpretation: NOT DETECTED
C Diff toxin: NEGATIVE

## 2018-03-30 LAB — APTT: APTT: 26 s (ref 24–36)

## 2018-03-30 LAB — TROPONIN I: Troponin I: 0.04 ng/mL (ref <0.03)

## 2018-03-30 LAB — LACTIC ACID, PLASMA
LACTIC ACID, VENOUS: 6 mmol/L — AB (ref 0.5–1.9)
LACTIC ACID, VENOUS: 5.7 mmol/L — AB (ref 0.5–1.9)
Lactic Acid, Venous: 5.2 mmol/L (ref 0.5–1.9)

## 2018-03-30 LAB — LIPASE, BLOOD: Lipase: 25 U/L (ref 11–51)

## 2018-03-30 LAB — PROTIME-INR
INR: 1.05
Prothrombin Time: 13.6 seconds (ref 11.4–15.2)

## 2018-03-30 LAB — PROCALCITONIN: PROCALCITONIN: 1.82 ng/mL

## 2018-03-30 MED ORDER — SENNA 8.6 MG PO TABS: 1.0000 | ORAL_TABLET | Freq: Two times a day (BID) | ORAL | Status: DC

## 2018-03-30 MED ORDER — IOHEXOL 350 MG/ML SOLN
75.0000 mL | Freq: Once | INTRAVENOUS | Status: AC | PRN
  Administered 2018-03-30: 75 mL via INTRAVENOUS

## 2018-03-30 MED ORDER — SODIUM CHLORIDE 0.9 % IV SOLN
500.0000 mg | INTRAVENOUS | Status: DC
  Administered 2018-03-30: 500 mg via INTRAVENOUS
  Filled 2018-03-30: qty 500

## 2018-03-30 MED ORDER — SODIUM CHLORIDE 0.9 % IV SOLN
2.0000 g | INTRAVENOUS | Status: DC
  Administered 2018-03-30: 2 g via INTRAVENOUS
  Filled 2018-03-30 (×2): qty 20

## 2018-03-30 MED ORDER — POLYETHYLENE GLYCOL 3350 17 G PO PACK: 17.0000 g | PACK | Freq: Every day | ORAL | Status: DC | PRN

## 2018-03-30 MED ORDER — ONDANSETRON HCL 4 MG/2ML IJ SOLN: 4.0000 mg | Freq: Four times a day (QID) | INTRAMUSCULAR | Status: DC | PRN

## 2018-03-30 MED ORDER — ONDANSETRON HCL 4 MG PO TABS: 4.0000 mg | ORAL_TABLET | Freq: Four times a day (QID) | ORAL | Status: DC | PRN

## 2018-03-30 MED ORDER — ACETAMINOPHEN 650 MG RE SUPP: 650.0000 mg | Freq: Four times a day (QID) | RECTAL | Status: DC | PRN

## 2018-03-30 MED ORDER — SODIUM CHLORIDE 0.9 % IV BOLUS (SEPSIS)
2000.0000 mL | Freq: Once | INTRAVENOUS | Status: AC
  Administered 2018-03-30: 2000 mL via INTRAVENOUS

## 2018-03-30 MED ORDER — SODIUM CHLORIDE 0.9 % IV SOLN: 3.0000 g | Freq: Once | INTRAVENOUS | Status: DC

## 2018-03-30 MED ORDER — ACETAMINOPHEN 325 MG PO TABS: 650.0000 mg | ORAL_TABLET | Freq: Four times a day (QID) | ORAL | Status: DC | PRN

## 2018-03-30 MED ORDER — ASPIRIN EC 81 MG PO TBEC: 81.0000 mg | DELAYED_RELEASE_TABLET | Freq: Every day | ORAL | Status: DC

## 2018-03-30 MED ORDER — SODIUM CHLORIDE 0.9 % IV SOLN
3.0000 g | Freq: Four times a day (QID) | INTRAVENOUS | Status: DC
  Administered 2018-03-30 – 2018-03-31 (×3): 3 g via INTRAVENOUS
  Filled 2018-03-30 (×6): qty 3

## 2018-03-30 MED ORDER — SODIUM CHLORIDE 0.9 % IV SOLN: 3.0000 g | Freq: Four times a day (QID) | INTRAVENOUS | Status: DC

## 2018-03-30 MED ORDER — NALOXONE HCL 2 MG/2ML IJ SOSY
0.4000 mg | PREFILLED_SYRINGE | Freq: Once | INTRAMUSCULAR | Status: AC
  Administered 2018-03-30: 0.4 mg via INTRAVENOUS
  Filled 2018-03-30: qty 2

## 2018-03-30 MED ORDER — SODIUM CHLORIDE 0.9 % IV SOLN
INTRAVENOUS | Status: DC
  Administered 2018-03-30 – 2018-04-02 (×8): via INTRAVENOUS

## 2018-03-30 MED ORDER — ENOXAPARIN SODIUM 40 MG/0.4ML ~~LOC~~ SOLN
40.0000 mg | SUBCUTANEOUS | Status: DC
  Administered 2018-03-30: 40 mg via SUBCUTANEOUS
  Filled 2018-03-30: qty 0.4

## 2018-03-30 NOTE — ED Triage Notes (Signed)
Pt arrives from home via EMS for altered mental status. Since midnight pt has been lethargic and vomiting per husband. Upon EMS arrival pt diaphoretic and slumped over toilet. Basline pt is AOx4 and walking. EMS vitals as follows 92.3 temp 109HR 131/95 86%RA BGL 218. 4mg  zofran given and normal saline. Sinus tach on EKG.

## 2018-03-30 NOTE — Progress Notes (Signed)
Patient ID: Donna Marsh, female   DOB: [birth year], 62 y.o.   MRN: [id number] it is having profuse watery diarrhea which looks and smells like C. diff according to the nurse. Given sepsis and colitis on CT abdomen will send out C. diff stool studies. If positive change antibiotics to oral vancomycin.

## 2018-03-30 NOTE — Progress Notes (Signed)
ACP Family Meeting Note  Patient was brought in by EMS for altered mental status/lethargic nausea vomiting and abdominal pain. Began admitted for sepsis second suspected due to colitis as noted on CT abdomen with elevated white count lactic acidosis and acute renal failure secondary to poor PO intake.  Patient's husband is in the room. Patient is lethargic unable to discuss regarding her code status however patient's husband says she is a full code.  Time spent 17 mins Enedina Finner, MD

## 2018-03-30 NOTE — Progress Notes (Signed)
Pt admitted to unit from ED. Per ED report, pt was arousable to voice and could answer questions such as where pt was. Pt on arrival to unit will arouse to verbal questions, but will only answer yes or no, she then closes her eyes- Dr. Allena Katz aware of mental status. Pt has been having continuous liquid BMs since 4pm; flexiseal inserted per order, cdiff sample sent, enteric precautions initiated. Pt's axillary temp 96.9- received order from Dr. Allena Katz for bair hugger-waiting on warming blanket to be delivered and will initiate. Dr. Allena Katz also notified of latest lactic acid of 5.7, and that UDS is currently unable to be obtained because pt is incontinent and legs will not unclench to be cathed. Pt's husband at bedside.   Donna Marsh, Donna Marsh

## 2018-03-30 NOTE — ED Notes (Signed)
Laureen Ochs, RN given report.

## 2018-03-30 NOTE — ED Notes (Signed)
Date and time results received: 03/30/18 1304   Test/Critical: Troponin 0.04 ng/mL; Lactic Acid 6.0 mmol/L  Name of Provider Notified: Dr. Scotty Court

## 2018-03-30 NOTE — ED Notes (Signed)
Date and time results received: 03/30/18 1520  Test: lactic acid Critical Value: 5.2  Name of Provider Notified: stafford  Orders Received? Or Actions Taken?: no further orders given, will continue to monitor

## 2018-03-30 NOTE — H&P (Addendum)
[facility] - [place] at [facility]   PATIENT NAME: Donna Marsh    MR#:  [id number]  DATE OF BIRTH:  01-Apr-1956  DATE OF ADMISSION:  03/30/2018  PRIMARY CARE PHYSICIAN: Dortha Kern, MD   REQUESTING/REFERRING PHYSICIAN: Dr Scotty Court  CHIEF COMPLAINT:  altered mental status, vomiting last night, became diaphoretic and collapsed in the bathroom per husband. Patient is waking up how were not the best historian. She is very sleepy and lethargic   HISTORY OF PRESENT ILLNESS:  Donna Marsh  is a 62 y.o. female with a known history of chronic back pain gets narcotics from her primary care physician, h/o Alopecia, history of recurrent UTI, depression comes to the emergency room brought in by EMS after patient was found to be very lethargic diaphoretic and collapsed in the bathroom at home. She did episode of vomiting. According to the husband she is not been eating well for last couple days. Denies any respiratory symptoms in the form of cough or shortness of breath. Patient does wake up before bed tells me her belly is hurting. Husband reports patient has history of constipation  In the ER patient was found to be mildly tachycardic. She was initially hypotensive however now blood pressure is stable. She is waking up more however not having a meaningful conversation. Afebrile. She complains of abdominal pain.  Lactic acid was 6.0. CT of the abdomen shows evidence of colitis. Patient is being admitted with sepsis secondary to colitis.  Will start her on IV unasyn.  PAST MEDICAL HISTORY:   Past Medical History:  Diagnosis Date  . Alopecia universalis   . Anemia    in 1979  . Anginal pain (HCC)    hx of CP in 2013, went to the hospital, everything was negative  . Anxiety   . Arthritis    RA AND OA .    Marland Kitchen Blood transfusion 1979   WITH PREGNANCY  . Depression   . Edema    HX OF SWELLING OF FEET, LEGS, HANDS AND FACE--TAKES TRIAMTERENE/HCTZ FOR  PREVENTION--DOES NOT HAVE HYPERTENSION AND HAS BEEN TOLD B/P USUALLY LOW  . GERD (gastroesophageal reflux disease)    OCCAS--OTC MED IF ANYTHING NEEDED  . Headache(784.0)    HX OF MIGRAINES  . Hypercholesterolemia   . PONV (postoperative nausea and vomiting)   . URI (upper respiratory infection)    URI IN OCT 2012--PT FEELS CLEAR NOW--DOES NOT USUALLY HAVE ANY PROBLEMS WITH URI  . UTI (lower urinary tract infection)    FREQUENTLY--JUST FINISHED ANTIBIOTIC SAT 06/25/11 FOR UTI  . Varicose veins    LEFT LEG    PAST SURGICAL HISTOIRY:   Past Surgical History:  Procedure Laterality Date  . ABDOMINAL HYSTERECTOMY    . CHOLECYSTECTOMY  2011  . JOINT REPLACEMENT  JAN 2006   LEFT TOTAL KNEE ARTHROPLASTY  A  . JOINT REPLACEMENT  JAN 2012   RIGHT TOTAL KNEE ARTHROPLASTY  . TOTAL KNEE REVISION  07/04/2011   Procedure: bilateral TOTAL KNEE REVISION;  Surgeon: Shelda Pal;  Location: [facility];  Service: Orthopedics;  Laterality: Right;  Right Total Knee Revision with Scar Debridement/Patella Revision with Poly Exchange    SOCIAL HISTORY:   Social History   Tobacco Use  . Smoking status: Never Smoker  . Smokeless tobacco: Never Used  Substance Use Topics  . Alcohol use: Yes    Comment: RARELY    FAMILY HISTORY:   Family History  Problem Relation Age of Onset  .  Breast cancer Mother   . CAD Father   . Stroke Paternal Grandfather     DRUG ALLERGIES:  No Known Allergies  REVIEW OF SYSTEMS:  Review of Systems  Unable to perform ROS: Medical condition     MEDICATIONS AT HOME:   Prior to Admission medications   Medication Sig Start Date End Date Taking? Authorizing Provider  solifenacin (VESICARE) 5 MG tablet Take 5 mg by mouth daily. 12/29/17  Yes [provider]  acetaminophen-codeine (TYLENOL #3) 300-30 MG tablet Take 1 tablet by mouth every 6 (six) hours as needed for moderate pain or severe pain. Take 1 tablet every 6 to 8 hours as needed for pain.    [provider]  ALPRAZolam Prudy Feeler) 1 MG tablet Take 1 mg by mouth 3 (three) times daily.     [provider]  aspirin EC 81 MG tablet Take 1 tablet (81 mg total) by mouth daily. 11/29/16   Shaune Pollack, MD  atorvastatin (LIPITOR) 40 MG tablet Take 1 tablet (40 mg total) by mouth daily at 6 PM. 11/29/16   Shaune Pollack, MD  baclofen (LIORESAL) 20 MG tablet Take 1 tablet (20 mg total) by mouth 2 (two) times daily as needed for muscle spasms. 11/29/16   Shaune Pollack, MD  escitalopram (LEXAPRO) 20 MG tablet Take 40 mg by mouth daily.    [provider]  spironolactone (ALDACTONE) 50 MG tablet Take 150 mg by mouth daily.    [provider]  traMADol (ULTRAM) 50 MG tablet Take 1 tablet (50 mg total) by mouth every 6 (six) hours as needed for moderate pain (headache). 11/29/16   Shaune Pollack, MD      VITAL SIGNS:  Blood pressure 130/84, pulse 84, temperature (!) 97.4 F (36.3 C), temperature source Axillary, resp. rate (!) 21, weight 69.8 kg, SpO2 100 %.  PHYSICAL EXAMINATION:  GENERAL:  62 y.o.-year-old patient lying in the bed with no acute distress.  EYES: Pupils equal, round, reactive to light and accommodation. No scleral icterus. Extraocular muscles intact.  HEENT: Head atraumatic, normocephalic. Oropharynx and nasopharynx clear. Chronic Alopecia NECK:  Supple, no jugular venous distention. No thyroid enlargement, no tenderness.  LUNGS: Normal breath sounds bilaterally, no wheezing, rales,rhonchi or crepitation. No use of accessory muscles of respiration.  CARDIOVASCULAR: S1, S2 normal. No murmurs, rubs, or gallops. tachycardia ABDOMEN: Soft, nontender, distended++  Bowel sounds few present. No organomegaly or mass.  EXTREMITIES: No pedal edema, cyanosis, or clubbing.  NEUROLOGIC: very lethargic. Moves extremities spontaneously. No focal weakness noted  PSYCHIATRIC: The patient is sleepy. Momentary wakes up and sees a couple words falls back to sleep SKIN: No obvious rash, lesion,  or ulcer.   LABORATORY PANEL:   CBC Recent Labs  Lab 03/30/18 1228  WBC 23.7*  HGB 17.5*  HCT 54.7*  PLT 390   ------------------------------------------------------------------------------------------------------------------  Chemistries  Recent Labs  Lab 03/30/18 1228  NA 135  K 5.4*  CL 106  CO2 17*  GLUCOSE 197*  BUN 33*  CREATININE 1.59*  CALCIUM 10.8*  AST 65*  ALT 39  ALKPHOS 111  BILITOT 1.6*   ------------------------------------------------------------------------------------------------------------------  Cardiac Enzymes Recent Labs  Lab 03/30/18 1228  TROPONINI 0.04*   ------------------------------------------------------------------------------------------------------------------  RADIOLOGY:  Ct Head Wo Contrast  Result Date: 03/30/2018 CLINICAL DATA:  Altered mental status/lethargy.  Vomiting. EXAM: CT HEAD WITHOUT CONTRAST TECHNIQUE: Contiguous axial images were obtained from the base of the skull through the vertex without intravenous contrast. COMPARISON:  Head CT Nov 28, 2016 and brain MRI Nov 29, 2017 FINDINGS: Brain: The ventricles are normal in size and configuration. There is borderline frontal atrophy bilaterally, stable. There is no intracranial mass, hemorrhage, extra-axial fluid collection, or midline shift. Gray-white compartments appear normal. No evident acute infarct. Vascular: No hyperdense vessel evident. There is calcification in the right carotid siphon region. Skull: Bony calvarium appears intact. Sinuses/Orbits: Visualized paranasal sinuses are clear. Visualized orbits appear symmetric bilaterally. Other: Visualized mastoid air cells are clear. IMPRESSION: Slight frontal atrophy. Ventricles normal in size and configuration. Gray-white compartments appear normal. No mass or hemorrhage. There is calcification in the right carotid siphon region. Electronically Signed   By: Bretta Bang III M.D.   On: 03/30/2018 13:09   Ct Angio Chest  Pe W And/or Wo Contrast  Result Date: 03/30/2018 CLINICAL DATA:  Lethargy, nausea, vomiting, abdominal distension, diaphoresis, hypoxia, shock, question pulmonary embolism Hip EXAM: CT ANGIOGRAPHY CHEST CT ABDOMEN AND PELVIS WITH CONTRAST TECHNIQUE: Multidetector CT imaging of the chest was performed using the standard protocol during bolus administration of intravenous contrast. Multiplanar CT image reconstructions and MIPs were obtained to evaluate the vascular anatomy. Multidetector CT imaging of the abdomen and pelvis was performed using the standard protocol during bolus administration of intravenous contrast. CONTRAST:  75mL OMNIPAQUE IOHEXOL 350 MG/ML SOLN IV. No oral contrast. COMPARISON:  CT abdomen and pelvis 02/27/2018 FINDINGS: CTA CHEST FINDINGS Cardiovascular: Aorta normal caliber without aneurysm or dissection. Pulmonary arteries well opacified and patent. No evidence of pulmonary embolism. No pericardial effusion. Mediastinum/Nodes: Moderate-sized hiatal hernia. Esophagus otherwise unremarkable. Base of cervical region normal appearance. No thoracic adenopathy. Lungs/Pleura: Bibasilar atelectasis. 3 mm subpleural nodule RIGHT middle lobe image 41 unchanged no infiltrate, pleural effusion or pneumothorax. Musculoskeletal: Bones mildly demineralized without acute abnormality Review of the MIP images confirms the above findings. CT ABDOMEN and PELVIS FINDINGS Hepatobiliary: Gallbladder surgically absent. Liver normal appearance Pancreas: Normal appearance Spleen: Normal appearance Adrenals/Urinary Tract: Adrenal glands normal appearance. LEFT renal cysts unchanged. Kidneys, ureters, and bladder otherwise normal appearance Stomach/Bowel: Appendix not localized. Moderate hiatal hernia. Remainder of stomach decompressed. Small bowel loops unremarkable. Mobile cecum with tip located in the LEFT upper quadrant. Decompressed terminal ileum. Increased stool in rectosigmoid colon. Diffuse dilatation of the  colon from cecal tip to distal ascending colon. Wall thickening of the colon from sigmoid level to at least the transverse colon consistent with colitis, associated with hyperemia it of the associated mesentery and minimal pericolic infiltration. Proximal 2/3 of the colon is fluid-filled. No definite point of obstruction identified. Vascular/Lymphatic: Scattered atherosclerotic calcifications aorta. No adenopathy. Reproductive: Uterus surgically absent. Small calcifications in the ovaries bilaterally. Other: No free air or free fluid.  No hernia. Musculoskeletal: Prior lumbar fusion L4-S1.  Bones demineralized. Review of the MIP images confirms the above findings. IMPRESSION: Dilated colon fluid-filled proximally and with increased stool in rectosigmoid colon. Mild colonic wall thickening is identified at the distal half of the colon with hyperemia of the mesocolon and minimal pericolic infiltrative changes consistent with colitis; differential diagnosis would include infection and inflammatory bowel disease, ischemia considered less likely. No evidence of pulmonary embolism or significant intrathoracic process. Moderate-sized hiatal hernia. Electronically Signed   By: Ulyses Southward M.D.   On: 03/30/2018 14:21   Ct Abdomen Pelvis W Contrast  Result Date: 03/30/2018 CLINICAL DATA:  Lethargy, nausea, vomiting, abdominal distension, diaphoresis, hypoxia, shock, question pulmonary embolism Hip EXAM: CT ANGIOGRAPHY CHEST CT ABDOMEN AND PELVIS WITH CONTRAST TECHNIQUE: Multidetector CT imaging of the chest was performed  using the standard protocol during bolus administration of intravenous contrast. Multiplanar CT image reconstructions and MIPs were obtained to evaluate the vascular anatomy. Multidetector CT imaging of the abdomen and pelvis was performed using the standard protocol during bolus administration of intravenous contrast. CONTRAST:  46mL OMNIPAQUE IOHEXOL 350 MG/ML SOLN IV. No oral contrast. COMPARISON:  CT  abdomen and pelvis 02/27/2018 FINDINGS: CTA CHEST FINDINGS Cardiovascular: Aorta normal caliber without aneurysm or dissection. Pulmonary arteries well opacified and patent. No evidence of pulmonary embolism. No pericardial effusion. Mediastinum/Nodes: Moderate-sized hiatal hernia. Esophagus otherwise unremarkable. Base of cervical region normal appearance. No thoracic adenopathy. Lungs/Pleura: Bibasilar atelectasis. 3 mm subpleural nodule RIGHT middle lobe image 41 unchanged no infiltrate, pleural effusion or pneumothorax. Musculoskeletal: Bones mildly demineralized without acute abnormality Review of the MIP images confirms the above findings. CT ABDOMEN and PELVIS FINDINGS Hepatobiliary: Gallbladder surgically absent. Liver normal appearance Pancreas: Normal appearance Spleen: Normal appearance Adrenals/Urinary Tract: Adrenal glands normal appearance. LEFT renal cysts unchanged. Kidneys, ureters, and bladder otherwise normal appearance Stomach/Bowel: Appendix not localized. Moderate hiatal hernia. Remainder of stomach decompressed. Small bowel loops unremarkable. Mobile cecum with tip located in the LEFT upper quadrant. Decompressed terminal ileum. Increased stool in rectosigmoid colon. Diffuse dilatation of the colon from cecal tip to distal ascending colon. Wall thickening of the colon from sigmoid level to at least the transverse colon consistent with colitis, associated with hyperemia it of the associated mesentery and minimal pericolic infiltration. Proximal 2/3 of the colon is fluid-filled. No definite point of obstruction identified. Vascular/Lymphatic: Scattered atherosclerotic calcifications aorta. No adenopathy. Reproductive: Uterus surgically absent. Small calcifications in the ovaries bilaterally. Other: No free air or free fluid.  No hernia. Musculoskeletal: Prior lumbar fusion L4-S1.  Bones demineralized. Review of the MIP images confirms the above findings. IMPRESSION: Dilated colon fluid-filled  proximally and with increased stool in rectosigmoid colon. Mild colonic wall thickening is identified at the distal half of the colon with hyperemia of the mesocolon and minimal pericolic infiltrative changes consistent with colitis; differential diagnosis would include infection and inflammatory bowel disease, ischemia considered less likely. No evidence of pulmonary embolism or significant intrathoracic process. Moderate-sized hiatal hernia. Electronically Signed   By: Ulyses Southward M.D.   On: 03/30/2018 14:21   Dg Chest Port 1 View  Result Date: 03/30/2018 CLINICAL DATA:  Altered mental status. EXAM: PORTABLE CHEST 1 VIEW COMPARISON:  Chest x-ray 01/06/2015. FINDINGS: Mediastinum and hilar structures normal. Low lung volumes with mild upper lobe and bibasilar atelectasis. Small bilateral pleural effusions. Sliding hiatal hernia. IMPRESSION: 1.  Sliding hiatal hernia. 2. Low lung volumes with mild right upper lobe and bibasilar atelectasis. Small bilateral pleural effusions. Electronically Signed   By: Maisie Fus  Register   On: 03/30/2018 12:41    EKG:    IMPRESSION AND PLAN:   Donna Marsh  is a 62 y.o. female with a known history of chronic back pain gets narcotics from her primary care physician, h/o Alopecia, history of recurrent UTI, depression comes to the emergency room brought in by EMS after patient was found to be very lethargic diaphoretic and collapsed in the bathroom at home. She did episode of vomiting. According to the husband she is not been eating well for last couple days.  1. sepsis suspected due to acute colitis -presented with altered mental status, diaphoresis and very weak -CT abdomen suggestive of colitis. No pneumonia noted on CT chest. -IV Unasyn -consider G.I. consultation versus surgery if needed -patient also has elevated white count to 23,000,  tachycardia, lactic acidosis  2. acidosis secondary to 1. and poor PO intake along with nausea and vomiting -continue  aggressive IV hydration  3. h/o constipation suspected narcotic induced which patient takes for back pain -stool softener  4. Anxiety depression -resume home meds when patient is awake  5. Acute renal failure with mild hyperkalemia continue IV hydration. Baseline creatinine is 1.1 -BMP in am  6. DVT prophylaxis subcu Lovenox renal dosing  All the records are reviewed and case discussed with ED provider. Management plans discussed with the patient, family and they are in agreement.  CODE STATUS: full  TOTAL TIME TAKING CARE OF THIS PATIENT: *55* minutes.    Enedina Finner M.D on 03/30/2018 at 2:43 PM  Between 7am to 6pm - Pager - [phone]  After 6pm go to [email or web address] - password [email or web address] [facility]  [facility]  Office  [phone]  CC: Primary care physician; Dortha Kern, MD

## 2018-03-30 NOTE — Progress Notes (Signed)
CODE SEPSIS - PHARMACY COMMUNICATION  **Broad Spectrum Antibiotics should be administered within 1 hour of Sepsis diagnosis**  Time Code Sepsis Called/Page Received: 03/30/18 1226  Antibiotics Ordered: Azith/CTX  Time of 1st antibiotic administration: 1232  Additional action taken by pharmacy:    If necessary, Name of Provider/Nurse Contacted:      Angelique Blonder ,PharmD Clinical Pharmacist  03/30/2018  12:48 PM

## 2018-03-30 NOTE — ED Provider Notes (Signed)
[facility] Emergency Department Provider Note  ____________________________________________  Time seen: Approximately 12:25 PM  I have reviewed the triage vital signs and the nursing notes.   HISTORY  Chief Complaint Altered Mental Status  Level 5 Caveat: Portions of the History and Physical including HPI and review of systems are unable to be completely obtained due to patient being a poor historian due to altered mental status   HPI Donna Marsh is a 62 y.o. female with a history of alopecia, anxiety, depression, GERD who was in her usual state of health all through yesterday, but then patient woke up around midnight and had to go to the bathroom.  She was found by her husband to have vomiting and diarrhea, diaphoresis, very low energy and being slumped over on the toilet.  She had not reported any symptoms to her husband prior to this episode.  Throughout the morning she is continued to have vomiting, low energy, and disorientation according to husband.  EMS note the room air oxygen saturation was 86%.  Tachycardic to 110.  Gave 500 mL of normal saline bolus prior to arrival in the hospital.  Husband notes only significant medical history is arthritis, only medications are pain medicine, Xanax, antidepressant and that he does not think she is taking any extra of her medicines recently.  No known ingestion or overdose.      Past Medical History:  Diagnosis Date  . Alopecia universalis   . Anemia    in 1979  . Anginal pain (HCC)    hx of CP in 2013, went to the hospital, everything was negative  . Anxiety   . Arthritis    RA AND OA .    Marland Kitchen Blood transfusion 1979   WITH PREGNANCY  . Depression   . Edema    HX OF SWELLING OF FEET, LEGS, HANDS AND FACE--TAKES TRIAMTERENE/HCTZ FOR PREVENTION--DOES NOT HAVE HYPERTENSION AND HAS BEEN TOLD B/P USUALLY LOW  . GERD (gastroesophageal reflux disease)    OCCAS--OTC MED IF ANYTHING NEEDED  .  Headache(784.0)    HX OF MIGRAINES  . Hypercholesterolemia   . PONV (postoperative nausea and vomiting)   . URI (upper respiratory infection)    URI IN OCT 2012--PT FEELS CLEAR NOW--DOES NOT USUALLY HAVE ANY PROBLEMS WITH URI  . UTI (lower urinary tract infection)    FREQUENTLY--JUST FINISHED ANTIBIOTIC SAT 06/25/11 FOR UTI  . Varicose veins    LEFT LEG     Patient Active Problem List   Diagnosis Date Noted  . TIA (transient ischemic attack) 11/28/2016  . Spinal stenosis 01/08/2015  . 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 ABDOMINAL HYSTERECTOMY    . CHOLECYSTECTOMY  2011  . JOINT REPLACEMENT  JAN 2006   LEFT TOTAL KNEE ARTHROPLASTY  A  . JOINT REPLACEMENT  JAN 2012   RIGHT TOTAL KNEE ARTHROPLASTY  . TOTAL KNEE REVISION  07/04/2011   Procedure: bilateral TOTAL KNEE REVISION;  Surgeon: Shelda Pal;  Location: [facility];  Service: Orthopedics;  Laterality: Right;  Right Total Knee Revision with Scar Debridement/Patella Revision with Poly Exchange     Prior to Admission medications   Medication Sig Start Date End Date Taking? Authorizing Provider  acetaminophen-codeine (TYLENOL #3) 300-30 MG tablet Take 1 tablet by mouth every 6 (six) hours as needed for moderate pain or severe pain. Take 1 tablet every 6 to 8 hours as needed for pain.  [provider]  ALPRAZolam Prudy Feeler) 1 MG tablet Take 1 mg by mouth 3 (three) times daily.     [provider]  aspirin EC 81 MG tablet Take 1 tablet (81 mg total) by mouth daily. 11/29/16   Shaune Pollack, MD  atorvastatin (LIPITOR) 40 MG tablet Take 1 tablet (40 mg total) by mouth daily at 6 PM. 11/29/16   Shaune Pollack, MD  baclofen (LIORESAL) 20 MG tablet Take 1 tablet (20 mg total) by mouth 2 (two) times daily as needed for muscle spasms. 11/29/16   Shaune Pollack, MD  escitalopram (LEXAPRO) 20 MG tablet Take 40 mg by mouth daily.    [provider]  spironolactone  (ALDACTONE) 50 MG tablet Take 150 mg by mouth daily.    [provider]  traMADol (ULTRAM) 50 MG tablet Take 1 tablet (50 mg total) by mouth every 6 (six) hours as needed for moderate pain (headache). 11/29/16   Shaune Pollack, MD     Allergies Patient has no known allergies.   Family History  Problem Relation Age of Onset  . Breast cancer Mother   . CAD Father   . Stroke Paternal Grandfather     Social History Social History   Tobacco Use  . Smoking status: Never Smoker  . Smokeless tobacco: Never Used  Substance Use Topics  . Alcohol use: Yes    Comment: RARELY  . Drug use: No    Review of Systems   Level 5 Caveat: Portions of the History and Physical including HPI and review of systems are unable to be completely obtained due to patient being a poor historian   Gastrointestinal:   Positive for vomiting and diarrhea.  Musculoskeletal:   Positive chronic back pain   ____________________________________________   PHYSICAL EXAM:  VITAL SIGNS: ED Triage Vitals  Enc Vitals Group     BP 03/30/18 1222 116/83     Pulse Rate 03/30/18 1219 (!) 117     Resp 03/30/18 1219 18     Temp 03/30/18 1219 (!) 97.4 F (36.3 C)     Temp Source 03/30/18 1219 Axillary     SpO2 03/30/18 1219 98 %     Weight --      Height --      Head Circumference --      Peak Flow --      Pain Score 03/30/18 1216 0     Pain Loc --      Pain Edu? --      Excl. in GC? --     Vital signs reviewed, nursing assessments reviewed.   Constitutional:   Awake, obtunded.  Unresponsive..  Ill-appearing Eyes:   Conjunctivae are normal. EOMI. pupils are equal and round, very sluggish light reflex, symmetric at 5 mm bilaterally ENT      Head:   Normocephalic and atraumatic.      Nose:   No congestion/rhinnorhea.       Mouth/Throat:   Dry mucous membranes, no pharyngeal erythema. No peritonsillar mass.       Neck:   No meningismus. Full ROM. Hematological/Lymphatic/Immunilogical:   No cervical  lymphadenopathy. Cardiovascular:   Tachycardia heart rate 115. Symmetric bilateral radial and DP pulses.  No murmurs. Cap refill less than 2 seconds. Respiratory:   Tachypnea.  Normal work of breathing.  No crackles.  Good air movement. Gastrointestinal:   Soft and nontender.  Moderately distended. There is no CVA tenderness.  No rebound, rigidity, or guarding. Musculoskeletal:   Normal range  of motion in all extremities. No joint effusions.  No lower extremity tenderness.  No edema. Neurologic:   Nonverbal.  Not following commands or answering questions. GCS = [id number] =9.  Motor grossly intact.   Skin:    Skin is warm, dry and intact. No rash noted.  No petechiae, purpura, or bullae.  ____________________________________________    LABS (pertinent positives/negatives) (all labs ordered are listed, but only abnormal results are displayed) Labs Reviewed  LACTIC ACID, PLASMA - Abnormal; Notable for the following components:      Result Value   Lactic Acid, Venous 6.0 (*)    All other components within normal limits  COMPREHENSIVE METABOLIC PANEL - Abnormal; Notable for the following components:   Potassium 5.4 (*)    CO2 17 (*)    Glucose, Bld 197 (*)    BUN 33 (*)    Creatinine, Ser 1.59 (*)    Calcium 10.8 (*)    AST 65 (*)    Total Bilirubin 1.6 (*)    GFR calc non Af Amer 34 (*)    GFR calc Af Amer 39 (*)    All other components within normal limits  TROPONIN I - Abnormal; Notable for the following components:   Troponin I 0.04 (*)    All other components within normal limits  CBC WITH DIFFERENTIAL/PLATELET - Abnormal; Notable for the following components:   WBC 23.7 (*)    RBC 5.98 (*)    Hemoglobin 17.5 (*)    HCT 54.7 (*)    RDW 17.5 (*)    Neutro Abs 21.3 (*)    Monocytes Absolute 1.2 (*)    All other components within normal limits  URINALYSIS, COMPLETE (UACMP) WITH MICROSCOPIC - Abnormal; Notable for the following components:   Color, Urine AMBER (*)     APPearance CLEAR (*)    Ketones, ur 5 (*)    All other components within normal limits  CULTURE, BLOOD (ROUTINE X 2)  CULTURE, BLOOD (ROUTINE X 2)  URINE CULTURE  LIPASE, BLOOD  PROCALCITONIN  APTT  PROTIME-INR  LACTIC ACID, PLASMA  LACTIC ACID, PLASMA   ____________________________________________   EKG  Interpreted by me Sinus tachycardia rate 119, normal axis and intervals.  Normal QRS ST segments and T waves.  No acute ischemic changes.  ____________________________________________    RADIOLOGY  Ct Head Wo Contrast  Result Date: 03/30/2018 CLINICAL DATA:  Altered mental status/lethargy.  Vomiting. EXAM: CT HEAD WITHOUT CONTRAST TECHNIQUE: Contiguous axial images were obtained from the base of the skull through the vertex without intravenous contrast. COMPARISON:  Head CT Nov 28, 2016 and brain MRI Nov 29, 2017 FINDINGS: Brain: The ventricles are normal in size and configuration. There is borderline frontal atrophy bilaterally, stable. There is no intracranial mass, hemorrhage, extra-axial fluid collection, or midline shift. Gray-white compartments appear normal. No evident acute infarct. Vascular: No hyperdense vessel evident. There is calcification in the right carotid siphon region. Skull: Bony calvarium appears intact. Sinuses/Orbits: Visualized paranasal sinuses are clear. Visualized orbits appear symmetric bilaterally. Other: Visualized mastoid air cells are clear. IMPRESSION: Slight frontal atrophy. Ventricles normal in size and configuration. Gray-white compartments appear normal. No mass or hemorrhage. There is calcification in the right carotid siphon region. Electronically Signed   By: Bretta Bang III M.D.   On: 03/30/2018 13:09   Ct Angio Chest Pe W And/or Wo Contrast  Result Date: 03/30/2018 CLINICAL DATA:  Lethargy, nausea, vomiting, abdominal distension, diaphoresis, hypoxia, shock, question pulmonary embolism Hip EXAM: CT ANGIOGRAPHY  CHEST CT ABDOMEN AND PELVIS  WITH CONTRAST TECHNIQUE: Multidetector CT imaging of the chest was performed using the standard protocol during bolus administration of intravenous contrast. Multiplanar CT image reconstructions and MIPs were obtained to evaluate the vascular anatomy. Multidetector CT imaging of the abdomen and pelvis was performed using the standard protocol during bolus administration of intravenous contrast. CONTRAST:  75mL OMNIPAQUE IOHEXOL 350 MG/ML SOLN IV. No oral contrast. COMPARISON:  CT abdomen and pelvis 02/27/2018 FINDINGS: CTA CHEST FINDINGS Cardiovascular: Aorta normal caliber without aneurysm or dissection. Pulmonary arteries well opacified and patent. No evidence of pulmonary embolism. No pericardial effusion. Mediastinum/Nodes: Moderate-sized hiatal hernia. Esophagus otherwise unremarkable. Base of cervical region normal appearance. No thoracic adenopathy. Lungs/Pleura: Bibasilar atelectasis. 3 mm subpleural nodule RIGHT middle lobe image 41 unchanged no infiltrate, pleural effusion or pneumothorax. Musculoskeletal: Bones mildly demineralized without acute abnormality Review of the MIP images confirms the above findings. CT ABDOMEN and PELVIS FINDINGS Hepatobiliary: Gallbladder surgically absent. Liver normal appearance Pancreas: Normal appearance Spleen: Normal appearance Adrenals/Urinary Tract: Adrenal glands normal appearance. LEFT renal cysts unchanged. Kidneys, ureters, and bladder otherwise normal appearance Stomach/Bowel: Appendix not localized. Moderate hiatal hernia. Remainder of stomach decompressed. Small bowel loops unremarkable. Mobile cecum with tip located in the LEFT upper quadrant. Decompressed terminal ileum. Increased stool in rectosigmoid colon. Diffuse dilatation of the colon from cecal tip to distal ascending colon. Wall thickening of the colon from sigmoid level to at least the transverse colon consistent with colitis, associated with hyperemia it of the associated mesentery and minimal  pericolic infiltration. Proximal 2/3 of the colon is fluid-filled. No definite point of obstruction identified. Vascular/Lymphatic: Scattered atherosclerotic calcifications aorta. No adenopathy. Reproductive: Uterus surgically absent. Small calcifications in the ovaries bilaterally. Other: No free air or free fluid.  No hernia. Musculoskeletal: Prior lumbar fusion L4-S1.  Bones demineralized. Review of the MIP images confirms the above findings. IMPRESSION: Dilated colon fluid-filled proximally and with increased stool in rectosigmoid colon. Mild colonic wall thickening is identified at the distal half of the colon with hyperemia of the mesocolon and minimal pericolic infiltrative changes consistent with colitis; differential diagnosis would include infection and inflammatory bowel disease, ischemia considered less likely. No evidence of pulmonary embolism or significant intrathoracic process. Moderate-sized hiatal hernia. Electronically Signed   By: Ulyses Southward M.D.   On: 03/30/2018 14:21   Ct Abdomen Pelvis W Contrast  Result Date: 03/30/2018 CLINICAL DATA:  Lethargy, nausea, vomiting, abdominal distension, diaphoresis, hypoxia, shock, question pulmonary embolism Hip EXAM: CT ANGIOGRAPHY CHEST CT ABDOMEN AND PELVIS WITH CONTRAST TECHNIQUE: Multidetector CT imaging of the chest was performed using the standard protocol during bolus administration of intravenous contrast. Multiplanar CT image reconstructions and MIPs were obtained to evaluate the vascular anatomy. Multidetector CT imaging of the abdomen and pelvis was performed using the standard protocol during bolus administration of intravenous contrast. CONTRAST:  75mL OMNIPAQUE IOHEXOL 350 MG/ML SOLN IV. No oral contrast. COMPARISON:  CT abdomen and pelvis 02/27/2018 FINDINGS: CTA CHEST FINDINGS Cardiovascular: Aorta normal caliber without aneurysm or dissection. Pulmonary arteries well opacified and patent. No evidence of pulmonary embolism. No pericardial  effusion. Mediastinum/Nodes: Moderate-sized hiatal hernia. Esophagus otherwise unremarkable. Base of cervical region normal appearance. No thoracic adenopathy. Lungs/Pleura: Bibasilar atelectasis. 3 mm subpleural nodule RIGHT middle lobe image 41 unchanged no infiltrate, pleural effusion or pneumothorax. Musculoskeletal: Bones mildly demineralized without acute abnormality Review of the MIP images confirms the above findings. CT ABDOMEN and PELVIS FINDINGS Hepatobiliary: Gallbladder surgically absent. Liver normal appearance Pancreas: Normal appearance  Spleen: Normal appearance Adrenals/Urinary Tract: Adrenal glands normal appearance. LEFT renal cysts unchanged. Kidneys, ureters, and bladder otherwise normal appearance Stomach/Bowel: Appendix not localized. Moderate hiatal hernia. Remainder of stomach decompressed. Small bowel loops unremarkable. Mobile cecum with tip located in the LEFT upper quadrant. Decompressed terminal ileum. Increased stool in rectosigmoid colon. Diffuse dilatation of the colon from cecal tip to distal ascending colon. Wall thickening of the colon from sigmoid level to at least the transverse colon consistent with colitis, associated with hyperemia it of the associated mesentery and minimal pericolic infiltration. Proximal 2/3 of the colon is fluid-filled. No definite point of obstruction identified. Vascular/Lymphatic: Scattered atherosclerotic calcifications aorta. No adenopathy. Reproductive: Uterus surgically absent. Small calcifications in the ovaries bilaterally. Other: No free air or free fluid.  No hernia. Musculoskeletal: Prior lumbar fusion L4-S1.  Bones demineralized. Review of the MIP images confirms the above findings. IMPRESSION: Dilated colon fluid-filled proximally and with increased stool in rectosigmoid colon. Mild colonic wall thickening is identified at the distal half of the colon with hyperemia of the mesocolon and minimal pericolic infiltrative changes consistent with  colitis; differential diagnosis would include infection and inflammatory bowel disease, ischemia considered less likely. No evidence of pulmonary embolism or significant intrathoracic process. Moderate-sized hiatal hernia. Electronically Signed   By: Ulyses Southward M.D.   On: 03/30/2018 14:21   Dg Chest Port 1 View  Result Date: 03/30/2018 CLINICAL DATA:  Altered mental status. EXAM: PORTABLE CHEST 1 VIEW COMPARISON:  Chest x-ray 01/06/2015. FINDINGS: Mediastinum and hilar structures normal. Low lung volumes with mild upper lobe and bibasilar atelectasis. Small bilateral pleural effusions. Sliding hiatal hernia. IMPRESSION: 1.  Sliding hiatal hernia. 2. Low lung volumes with mild right upper lobe and bibasilar atelectasis. Small bilateral pleural effusions. Electronically Signed   By: Maisie Fus  Register   On: 03/30/2018 12:41    ____________________________________________   PROCEDURES .Critical Care Performed by: Sharman Cheek, MD Authorized by: Sharman Cheek, MD   Critical care provider statement:    Critical care time (minutes):  35   Critical care time was exclusive of:  Separately billable procedures and treating other patients   Critical care was necessary to treat or prevent imminent or life-threatening deterioration of the following conditions:  Sepsis and CNS failure or compromise   Critical care was time spent personally by me on the following activities:  Development of treatment plan with patient or surrogate, discussions with consultants, evaluation of patient's response to treatment, examination of patient, obtaining history from patient or surrogate, ordering and performing treatments and interventions, ordering and review of laboratory studies, ordering and review of radiographic studies, pulse oximetry, re-evaluation of patient's condition and review of old charts    ____________________________________________  DIFFERENTIAL DIAGNOSIS   Pneumonia, sepsis, pulmonary  embolism, intracranial hemorrhage, stroke, intoxication  CLINICAL IMPRESSION / ASSESSMENT AND PLAN / ED COURSE  Pertinent labs & imaging results that were available during my care of the patient were reviewed by me and considered in my medical decision making (see chart for details).    Patient presents with vomiting and diarrhea at home, on arrival found to be encephalopathic for unclear reasons.  Hypoxia tachycardia also raise suspicion for pneumonia and pulmonary embolism.  Code sepsis initiated due to high suspicion of pneumonia but if chest x-ray is nondiagnostic, she will need a CT angiogram of the chest.  No evidence of liver disease to suggest hepatic encephalopathy.  Doubt GI bleed, trauma.  Possible intoxication from her combination of medications which cannot be sedating.  Will obtain stat CT head, monitor mental status while pursuing sepsis work-up.  Will include troponin due to abnormal repolarization pattern on her EKG which I suspect is rate related with her tachycardia.  Clinical Course as of Mar 30 1437  Fri Mar 30, 2018  1259 White blood cell count 23,000.  Chest x-ray is nondiagnostic.  Will need to proceed with CT scan.  Due to abdominal distention, altered mental status and very limited history to localize symptoms, I will include CT scan of abdomen and pelvis as well with suspicion for bowel obstruction, biliary disease, bowel perforation.   [PS]  1310 CT head images personally reviewed, no obvious intracranial hemorrhage or stroke.   [PS]  1312 Ct head unremarkable  CT Head Wo Contrast [PS]  1411 Further history obtained which does explain that the patient was started on Suboxone 3 days ago.  Opioid exposure would explain her altered mental status.  We will give naloxone diagnostically to confirm.  Doubt meningitis or encephalitis.   [PS]    Clinical Course User Index [PS] Sharman Cheek, MD    ----------------------------------------- 2:37 PM on  03/30/2018 -----------------------------------------  CT angios chest negative for PE, negative for pulmonary edema or infiltrate.  Hypoxia may be due to respiratory suppression from her opioid use.  She is responding to naloxone trial.  CT abdomen does reveal extensive constipation and colitis, also likely opioid related.  Case discussed with the hospitalist for further management.   ____________________________________________   FINAL CLINICAL IMPRESSION(S) / ED DIAGNOSES    Final diagnoses:  Altered mental status, unspecified altered mental status type  Colitis  Sepsis, due to unspecified organism [facility])     ED Discharge Orders    None      Portions of this note were generated with dragon dictation software. Dictation errors may occur despite best attempts at proofreading.    Sharman Cheek, MD 03/30/18 1438

## 2018-03-31 ENCOUNTER — Inpatient Hospital Stay: Payer: Medicare Other

## 2018-03-31 DIAGNOSIS — R4182 Altered mental status, unspecified: Secondary | ICD-10-CM

## 2018-03-31 LAB — BASIC METABOLIC PANEL
Anion gap: 12 (ref 5–15)
Anion gap: 6 (ref 5–15)
BUN: 41 mg/dL — ABNORMAL HIGH (ref 8–23)
BUN: 40 mg/dL — AB (ref 8–23)
CHLORIDE: 116 mmol/L — AB (ref 98–111)
CHLORIDE: 123 mmol/L — AB (ref 98–111)
CO2: 13 mmol/L — AB (ref 22–32)
CO2: 13 mmol/L — ABNORMAL LOW (ref 22–32)
CREATININE: 1.71 mg/dL — AB (ref 0.44–1.00)
Calcium: 9 mg/dL (ref 8.9–10.3)
Calcium: 8.3 mg/dL — ABNORMAL LOW (ref 8.9–10.3)
Creatinine, Ser: 1.47 mg/dL — ABNORMAL HIGH (ref 0.44–1.00)
GFR calc Af Amer: 43 mL/min — ABNORMAL LOW (ref >60)
GFR calc non Af Amer: 31 mL/min — ABNORMAL LOW (ref >60)
GFR calc non Af Amer: 37 mL/min — ABNORMAL LOW (ref >60)
GFR, EST AFRICAN AMERICAN: 36 mL/min — AB (ref >60)
Glucose, Bld: 133 mg/dL — ABNORMAL HIGH (ref 70–99)
Glucose, Bld: 96 mg/dL (ref 70–99)
Potassium: 6.3 mmol/L (ref 3.5–5.1)
Potassium: 4.3 mmol/L (ref 3.5–5.1)
SODIUM: 142 mmol/L (ref 135–145)
Sodium: 141 mmol/L (ref 135–145)

## 2018-03-31 LAB — URINE CULTURE: CULTURE: NO GROWTH

## 2018-03-31 LAB — LACTIC ACID, PLASMA
LACTIC ACID, VENOUS: 3.1 mmol/L — AB (ref 0.5–1.9)
LACTIC ACID, VENOUS: 2.3 mmol/L — AB (ref 0.5–1.9)
Lactic Acid, Venous: 3.9 mmol/L (ref 0.5–1.9)
Lactic Acid, Venous: 2.5 mmol/L (ref 0.5–1.9)

## 2018-03-31 LAB — GLUCOSE, CAPILLARY: GLUCOSE-CAPILLARY: 112 mg/dL — AB (ref 70–99)

## 2018-03-31 MED ORDER — DEXTROSE 50 % IV SOLN
25.0000 mL | INTRAVENOUS | Status: AC
  Administered 2018-03-31: 25 mL via INTRAVENOUS
  Filled 2018-03-31: qty 50

## 2018-03-31 MED ORDER — SODIUM CHLORIDE 0.9 % IV BOLUS: 1000.0000 mL | Freq: Once | INTRAVENOUS | Status: AC

## 2018-03-31 MED ORDER — VANCOMYCIN HCL 10 G IV SOLR
1250.0000 mg | INTRAVENOUS | Status: DC
  Administered 2018-04-01: 1250 mg via INTRAVENOUS
  Filled 2018-03-31: qty 1250

## 2018-03-31 MED ORDER — METRONIDAZOLE IN NACL 5-0.79 MG/ML-% IV SOLN: 500.0000 mg | Freq: Four times a day (QID) | INTRAVENOUS | Status: DC

## 2018-03-31 MED ORDER — INSULIN ASPART 100 UNIT/ML IV SOLN
10.0000 [IU] | INTRAVENOUS | Status: AC
  Administered 2018-03-31: 10 [IU] via INTRAVENOUS
  Filled 2018-03-31: qty 0.1

## 2018-03-31 MED ORDER — MORPHINE SULFATE (PF) 2 MG/ML IV SOLN
2.0000 mg | Freq: Once | INTRAVENOUS | Status: AC
  Administered 2018-03-31: 2 mg via INTRAVENOUS
  Filled 2018-03-31: qty 1

## 2018-03-31 MED ORDER — DEXTROSE 50 % IV SOLN
INTRAVENOUS | Status: AC
  Filled 2018-03-31: qty 50

## 2018-03-31 MED ORDER — DEXTROSE 5 % IV SOLN
10.0000 mg/kg | Freq: Two times a day (BID) | INTRAVENOUS | Status: DC
  Administered 2018-03-31 – 2018-04-01 (×2): 700 mg via INTRAVENOUS
  Filled 2018-03-31 (×3): qty 14

## 2018-03-31 MED ORDER — ASPIRIN 325 MG PO TABS
325.0000 mg | ORAL_TABLET | Freq: Every day | ORAL | Status: DC
  Filled 2018-03-31 (×2): qty 1

## 2018-03-31 MED ORDER — ATORVASTATIN CALCIUM 20 MG PO TABS
40.0000 mg | ORAL_TABLET | Freq: Every day | ORAL | Status: DC
  Administered 2018-04-02 – 2018-04-04 (×3): 40 mg via ORAL
  Filled 2018-03-31 (×3): qty 2

## 2018-03-31 MED ORDER — SODIUM CHLORIDE 0.9 % IV BOLUS
1000.0000 mL | Freq: Once | INTRAVENOUS | Status: AC
  Administered 2018-03-31: 1000 mL via INTRAVENOUS

## 2018-03-31 MED ORDER — VANCOMYCIN HCL IN DEXTROSE 1-5 GM/200ML-% IV SOLN
1000.0000 mg | Freq: Once | INTRAVENOUS | Status: AC
  Administered 2018-03-31: 1000 mg via INTRAVENOUS
  Filled 2018-03-31: qty 200

## 2018-03-31 MED ORDER — SODIUM CHLORIDE 0.9 % IV SOLN
1.0000 g | INTRAVENOUS | Status: AC
  Administered 2018-03-31: 1 g via INTRAVENOUS
  Filled 2018-03-31: qty 10

## 2018-03-31 MED ORDER — SODIUM POLYSTYRENE SULFONATE 15 GM/60ML PO SUSP
30.0000 g | Freq: Once | ORAL | Status: AC
  Administered 2018-03-31: 30 g via RECTAL

## 2018-03-31 MED ORDER — CIPROFLOXACIN IN D5W 400 MG/200ML IV SOLN
400.0000 mg | Freq: Two times a day (BID) | INTRAVENOUS | Status: DC
  Administered 2018-03-31 – 2018-04-02 (×5): 400 mg via INTRAVENOUS
  Filled 2018-03-31 (×8): qty 200

## 2018-03-31 MED ORDER — SODIUM CHLORIDE 0.9 % IV SOLN
2.0000 g | INTRAVENOUS | Status: DC
  Administered 2018-03-31: 2 g via INTRAVENOUS
  Filled 2018-03-31: qty 20
  Filled 2018-03-31: qty 2

## 2018-03-31 MED ORDER — METRONIDAZOLE IN NACL 5-0.79 MG/ML-% IV SOLN
500.0000 mg | Freq: Three times a day (TID) | INTRAVENOUS | Status: DC
  Administered 2018-03-31 – 2018-04-02 (×6): 500 mg via INTRAVENOUS
  Filled 2018-03-31 (×10): qty 100

## 2018-03-31 MED ORDER — SODIUM POLYSTYRENE SULFONATE 15 GM/60ML PO SUSP
30.0000 g | Freq: Once | ORAL | Status: DC
  Filled 2018-03-31: qty 120

## 2018-03-31 NOTE — Progress Notes (Signed)
Per husband pt began taking new medication Toviaz Tuesday the 3rd before she came to the ED. Pt is still disoriented and only opens eyes to voice. Pt not interactive. VSS. MD aware, new orders received. Husband updated at bedside. Will continue to monitor.

## 2018-03-31 NOTE — Consult Note (Signed)
[facility] Consult   Reason for Consult: Consult for this 62 year old woman in the hospital with acute altered mental status Referring Physician: Posey Pronto Patient Identification: Donna Marsh MRN:  [id number] Principal Diagnosis: Altered mental status Diagnosis:   Patient Active Problem List   Diagnosis Date Noted  . Altered mental status [R41.82] 03/31/2018  . Colitis [K52.9] 03/30/2018  . TIA (transient ischemic attack) [G45.9] 11/28/2016  . Spinal stenosis [M48.00] 01/08/2015  . 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 female patient admitted with patient not able to give information.  HPI: Patient seen.  Her husband is at bedside and gives the history.  Chart reviewed.  This is a 62 year old woman who was brought to the hospital with reports of acute altered mental status.  Husband states that yesterday morning she was in her current mental state which was an acute change from previously.  Since then she has not responded verbally to him and is not following direct commands.  He says there had been no sign that he knew of of any change in mood behavior or thinking leading up to this.  He says the night before she had been feeling sick to her stomach and having some GI symptoms but nothing mentally that it changed.  Absolutely denies that there is been any sign of depression.  Denies that there is been any sign of her concern about possible overdose or suicidality.  Patient's only recent medication change had been to start [place] earlier this week.  This is a medication for overactive bladder and has anticholinergic properties.  Otherwise the husband says she has been stable on her other medications including both her Xanax and pain medicine.  Interestingly, the husband definitively states that she takes both Tylenol 3 and hydrocodone on a daily basis.  I was not able to  find any confirmation either in the chart or in the controlled substance database of any prescription of hydrocodone.  Social history: Patient lives at home with her husband.  Husband does not report any significant new social stress.  Medical history: Patient has alopecia totalis.  History of spinal stenosis.  Overactive bladder.  Chronic pain.  Patient has had several prior hospitalizations for somewhat atypical presentations including one not long ago for left-sided weakness that did not turn out to actually be a stroke.  Substance abuse history: Husband denies any history of alcohol or drug abuse and there is nothing in the chart about alcohol or drug abuse.  Past Psychiatric History: The husband denies that she has any past psychiatric history whatsoever although looking back in her chart it is clear that depression and especially anxiety have been a chronic concern.  She has been on Xanax steadily for quite a while.  Also a point of interest, the dose of Xanax he is frequently stated in her inpatient chart as being 1 mg 3 times a day when in fact it is only prescribed as 1 mg twice a day.  No history of psychiatric hospitalization.  No previous psychiatric notes found.  Risk to Self:   Risk to Others:   Prior Inpatient Therapy:   Prior Outpatient Therapy:    Past Medical History:  Past Medical History:  Diagnosis Date  . Alopecia universalis   . Anemia    in 1979  . Anginal pain ([place])    hx of CP in 2013,  went to the hospital, everything was negative  . Anxiety   . Arthritis    RA AND OA .    [facility] Blood transfusion 1979   WITH PREGNANCY  . Depression   . Edema    HX OF SWELLING OF FEET, LEGS, HANDS AND FACE--TAKES TRIAMTERENE/HCTZ FOR PREVENTION--DOES NOT HAVE HYPERTENSION AND HAS BEEN TOLD B/P USUALLY LOW  . GERD (gastroesophageal reflux disease)    OCCAS--OTC MED IF ANYTHING NEEDED  . Headache(784.0)    HX OF MIGRAINES  . Hypercholesterolemia   . PONV (postoperative nausea  and vomiting)   . URI (upper respiratory infection)    URI IN OCT 2012--PT FEELS CLEAR NOW--DOES NOT USUALLY HAVE ANY PROBLEMS WITH URI  . UTI (lower urinary tract infection)    FREQUENTLY--JUST FINISHED ANTIBIOTIC SAT 06/25/11 FOR UTI  . Varicose veins    LEFT LEG    Past Surgical History:  Procedure Laterality Date  . ABDOMINAL HYSTERECTOMY    . CHOLECYSTECTOMY  2011  . JOINT REPLACEMENT  JAN 2006   LEFT TOTAL KNEE ARTHROPLASTY  A  . JOINT REPLACEMENT  JAN 2012   RIGHT TOTAL KNEE ARTHROPLASTY  . TOTAL KNEE REVISION  07/04/2011   Procedure: bilateral TOTAL KNEE REVISION;  Surgeon: Mauri Pole;  Location: [facility];  Service: Orthopedics;  Laterality: Right;  Right Total Knee Revision with Scar Debridement/Patella Revision with Poly Exchange   Family History:  Family History  Problem Relation Age of Onset  . Breast cancer Mother   . CAD Father   . Stroke Paternal Grandfather    Family Psychiatric  History: None known Social History:  Social History   Substance and Sexual Activity  Alcohol Use Yes   Comment: RARELY     Social History   Substance and Sexual Activity  Drug Use No    Social History   Socioeconomic History  . Marital status: Married    Spouse name: Not on file  . Number of children: Not on file  . Years of education: Not on file  . Highest education level: Not on file  Occupational History  . Not on file  Social Needs  . Financial resource strain: Not on file  . Food insecurity:    Worry: Not on file    Inability: Not on file  . Transportation needs:    Medical: Not on file    Non-medical: Not on file  Tobacco Use  . Smoking status: Never Smoker  . Smokeless tobacco: Never Used  Substance and Sexual Activity  . Alcohol use: Yes    Comment: RARELY  . Drug use: No  . Sexual activity: Not on file  Lifestyle  . Physical activity:    Days per week: Not on file    Minutes per session: Not on file  . Stress: Not on file  Relationships  .  Social connections:    Talks on phone: Not on file    Gets together: Not on file    Attends religious service: Not on file    Active member of club or organization: Not on file    Attends meetings of clubs or organizations: Not on file    Relationship status: Not on file  Other Topics Concern  . Not on file  Social History Narrative  . Not on file   Additional Social History:    Allergies:  No Known Allergies  Labs:  Results for orders placed or performed during the hospital encounter of 03/30/18 (from the past 48  hour(s))  Lactic acid, plasma     Status: Abnormal   Collection Time: 03/30/18 12:28 PM  Result Value Ref Range   Lactic Acid, Venous 6.0 (HH) 0.5 - 1.9 mmol/L    Comment: CRITICAL RESULT CALLED TO, READ BACK BY AND VERIFIED WITH [place] [facility] 03/30/18 @ 1305  [place] Performed at [facility], [address] [place], [place] [address]   Comprehensive metabolic panel     Status: Abnormal   Collection Time: 03/30/18 12:28 PM  Result Value Ref Range   Sodium 135 135 - 145 mmol/L   Potassium 5.4 (H) 3.5 - 5.1 mmol/L    Comment: HEMOLYSIS AT THIS LEVEL MAY AFFECT RESULT   Chloride 106 98 - 111 mmol/L   CO2 17 (L) 22 - 32 mmol/L   Glucose, Bld 197 (H) 70 - 99 mg/dL   BUN 33 (H) 8 - 23 mg/dL   Creatinine, Ser 1.59 (H) 0.44 - 1.00 mg/dL   Calcium 10.8 (H) 8.9 - 10.3 mg/dL   Total Protein 8.1 6.5 - 8.1 g/dL   Albumin 4.0 3.5 - 5.0 g/dL   AST 65 (H) 15 - 41 U/L    Comment: HEMOLYSIS AT THIS LEVEL MAY AFFECT RESULT   ALT 39 0 - 44 U/L   Alkaline Phosphatase 111 38 - 126 U/L   Total Bilirubin 1.6 (H) 0.3 - 1.2 mg/dL    Comment: HEMOLYSIS AT THIS LEVEL MAY AFFECT RESULT   GFR calc non Af Amer 34 (L) >60 mL/min   GFR calc Af Amer 39 (L) >60 mL/min    Comment: (NOTE) The eGFR has been calculated using the CKD EPI equation. This calculation has not been validated in all clinical situations. eGFR's persistently <60 mL/min signify possible Chronic Kidney Disease.      Anion gap 12 5 - 15    Comment: Performed at [facility], [address] [place], [place] [address]  Lipase, blood     Status: None   Collection Time: 03/30/18 12:28 PM  Result Value Ref Range   Lipase 25 11 - 51 U/L    Comment: Performed at [facility], [address] [place], [place] [address]  Troponin I     Status: Abnormal   Collection Time: 03/30/18 12:28 PM  Result Value Ref Range   Troponin I 0.04 (HH) <0.03 ng/mL    Comment: CRITICAL RESULT CALLED TO, READ BACK BY AND VERIFIED WITH [place] [facility] 03/30/18 @ 1305  [place] Performed at [facility], [address] [place], [place] [address]   CBC WITH DIFFERENTIAL     Status: Abnormal   Collection Time: 03/30/18 12:28 PM  Result Value Ref Range   WBC 23.7 (H) 3.6 - 11.0 K/uL   RBC 5.98 (H) 3.80 - 5.20 MIL/uL   Hemoglobin 17.5 (H) 12.0 - 16.0 g/dL   HCT 54.7 (H) 35.0 - 47.0 %   MCV 91.4 80.0 - 100.0 fL   MCH 29.3 26.0 - 34.0 pg   MCHC 32.0 32.0 - 36.0 g/dL   RDW 17.5 (H) 11.5 - 14.5 %   Platelets 390 150 - 440 K/uL   Neutrophils Relative % 90 %   Neutro Abs 21.3 (H) 1.4 - 6.5 K/uL   Lymphocytes Relative 5 %   Lymphs Abs 1.1 1.0 - 3.6 K/uL   Monocytes Relative 5 %   Monocytes Absolute 1.2 (H) 0.2 - 0.9 K/uL   Eosinophils Relative 0 %   Eosinophils Absolute 0.0 0 - 0.7 K/uL  Basophils Relative 0 %   Basophils Absolute 0.0 0 - 0.1 K/uL    Comment: Performed at [facility], [address] [place], [place] [address]  Procalcitonin     Status: None   Collection Time: 03/30/18 12:28 PM  Result Value Ref Range   Procalcitonin 1.82 ng/mL    Comment:        Interpretation: PCT > 0.5 ng/mL and <= 2 ng/mL: Systemic infection (sepsis) is possible, but other conditions are known to elevate PCT as well. (NOTE)       Sepsis PCT Algorithm           Lower Respiratory Tract                                      Infection PCT Algorithm    ----------------------------      ----------------------------         PCT < 0.25 ng/mL                PCT < 0.10 ng/mL         Strongly encourage             Strongly discourage   discontinuation of antibiotics    initiation of antibiotics    ----------------------------     -----------------------------       PCT 0.25 - 0.50 ng/mL            PCT 0.10 - 0.25 ng/mL               OR       >80% decrease in PCT            Discourage initiation of                                            antibiotics      Encourage discontinuation           of antibiotics    ----------------------------     -----------------------------         PCT >= 0.50 ng/mL              PCT 0.26 - 0.50 ng/mL                AND       <80% decrease in PCT             Encourage initiation of                                             antibiotics       Encourage continuation           of antibiotics    ----------------------------     -----------------------------        PCT >= 0.50 ng/mL                  PCT > 0.50 ng/mL               AND         increase in PCT                  Strongly encourage  initiation of antibiotics    Strongly encourage escalation           of antibiotics                                     -----------------------------                                           PCT <= 0.25 ng/mL                                                 OR                                        > 80% decrease in PCT                                     Discontinue / Do not initiate                                             antibiotics Performed at [facility], [address] [place], [place] [address]   APTT     Status: None   Collection Time: 03/30/18 12:28 PM  Result Value Ref Range   aPTT 26 24 - 36 seconds    Comment: Performed at [facility], [address] [place], [place] [address]  Protime-INR     Status: None   Collection Time: 03/30/18 12:28 PM  Result Value Ref Range    Prothrombin Time 13.6 11.4 - 15.2 seconds   INR 1.05     Comment: Performed at [facility], [address] [place] [address], [place], [place] [address]  Blood Culture (routine x 2)     Status: None (Preliminary result)   Collection Time: 03/30/18 12:28 PM  Result Value Ref Range   Specimen Description BLOOD LEFT ANTECUBITAL    Special Requests      BOTTLES DRAWN AEROBIC AND ANAEROBIC Blood Culture adequate volume   Culture      NO GROWTH < 24 HOURS Performed at [facility], [address], [place], [place] [address]    Report Status PENDING   Blood Culture (routine x 2)     Status: None (Preliminary result)   Collection Time: 03/30/18 12:28 PM  Result Value Ref Range   Specimen Description BLOOD BLOOD LEFT HAND    Special Requests      BOTTLES DRAWN AEROBIC AND ANAEROBIC Blood Culture results may not be optimal due to an inadequate volume of blood received in culture bottles   Culture      NO GROWTH < 24 HOURS Performed at [facility], [address] [place], [place] [address]    Report Status PENDING   Urinalysis, Complete w Microscopic     Status: Abnormal   Collection Time: 03/30/18  1:10 PM  Result Value Ref Range  Color, Urine AMBER (A) YELLOW    Comment: BIOCHEMICALS MAY BE AFFECTED BY COLOR   APPearance CLEAR (A) CLEAR   Specific Gravity, Urine 1.017 1.005 - 1.030   pH 5.0 5.0 - 8.0   Glucose, UA NEGATIVE NEGATIVE mg/dL   Hgb urine dipstick NEGATIVE NEGATIVE   Bilirubin Urine NEGATIVE NEGATIVE   Ketones, ur 5 (A) NEGATIVE mg/dL   Protein, ur NEGATIVE NEGATIVE mg/dL   Nitrite NEGATIVE NEGATIVE   Leukocytes, UA NEGATIVE NEGATIVE   RBC / HPF 0-5 0 - 5 RBC/hpf   WBC, UA 0-5 0 - 5 WBC/hpf   Bacteria, UA NONE SEEN NONE SEEN   Squamous Epithelial / LPF 0-5 0 - 5    Comment: Performed at [facility], [address], [place], [place] [address]  Urine culture     Status: None   Collection Time: 03/30/18  1:10 PM  Result Value Ref Range    Specimen Description      URINE, CATHETERIZED Performed at [facility], [address] [place], [place] [address]    Special Requests      NONE Performed at [facility], [address] [place], [place] [address]    Culture      NO GROWTH Performed at [facility], [address], [place], [place] [address]    Report Status 03/31/2018 FINAL   Urine Drug Screen, Qualitative ([facility] only)     Status: Abnormal   Collection Time: 03/30/18  1:10 PM  Result Value Ref Range   Tricyclic, Ur Screen NONE DETECTED NONE DETECTED   Amphetamines, Ur Screen NONE DETECTED NONE DETECTED   MDMA (Ecstasy)Ur Screen NONE DETECTED NONE DETECTED   Cocaine Metabolite,Ur [place] NONE DETECTED NONE DETECTED   Opiate, Ur Screen POSITIVE (A) NONE DETECTED   Phencyclidine (PCP) Ur S NONE DETECTED NONE DETECTED   Cannabinoid 50 Ng, Ur [place] NONE DETECTED NONE DETECTED   Barbiturates, Ur Screen NONE DETECTED NONE DETECTED   Benzodiazepine, Ur Scrn POSITIVE (A) NONE DETECTED   Methadone Scn, Ur NONE DETECTED NONE DETECTED    Comment: (NOTE) Tricyclics + metabolites, urine    Cutoff 1000 ng/mL Amphetamines + metabolites, urine  Cutoff 1000 ng/mL MDMA (Ecstasy), urine              Cutoff 500 ng/mL Cocaine Metabolite, urine          Cutoff 300 ng/mL Opiate + metabolites, urine        Cutoff 300 ng/mL Phencyclidine (PCP), urine         Cutoff 25 ng/mL Cannabinoid, urine                 Cutoff 50 ng/mL Barbiturates + metabolites, urine  Cutoff 200 ng/mL Benzodiazepine, urine              Cutoff 200 ng/mL Methadone, urine                   Cutoff 300 ng/mL The urine drug screen provides only a preliminary, unconfirmed analytical test result and should not be used for non-medical purposes. Clinical consideration and professional judgment should be applied to any positive drug screen result due to possible interfering substances. A more specific alternate chemical method must be used in order to  obtain a confirmed analytical result. Gas chromatography / mass spectrometry (GC/MS) is the preferred confirmat ory method. Performed at [facility], [place],  [address]   Lactic acid, plasma  Status: Abnormal   Collection Time: 03/30/18  2:33 PM  Result Value Ref Range   Lactic Acid, Venous 5.2 (HH) 0.5 - 1.9 mmol/L    Comment: CRITICAL RESULT CALLED TO, READ BACK BY AND VERIFIED WITH KAYLA CUMMINGS 03/30/18 @ 1521  [place] Performed at [facility], [address] [place], [place] [address]   Lactic acid, plasma     Status: Abnormal   Collection Time: 03/30/18  5:35 PM  Result Value Ref Range   Lactic Acid, Venous 5.7 (HH) 0.5 - 1.9 mmol/L    Comment: CRITICAL RESULT CALLED TO, READ BACK BY AND VERIFIED WITH CORI HUDSON 03/30/18 @ 1814  [place] Performed at [facility], [address] [place], [place] [address]   C difficile quick scan w PCR reflex     Status: None   Collection Time: 03/30/18  7:23 PM  Result Value Ref Range   C Diff antigen NEGATIVE NEGATIVE   C Diff toxin NEGATIVE NEGATIVE   C Diff interpretation No C. difficile detected.     Comment: Performed at [facility], [address] [place], [place] [address]  Basic metabolic panel     Status: Abnormal   Collection Time: 03/31/18  3:53 AM  Result Value Ref Range   Sodium 141 135 - 145 mmol/L   Potassium 6.3 (HH) 3.5 - 5.1 mmol/L    Comment: HEMOLYSIS AT THIS LEVEL MAY AFFECT RESULT CRITICAL RESULT CALLED TO, READ BACK BY AND VERIFIED WITH MATT PAIGE AT 0502 03/31/18.PMH    Chloride 116 (H) 98 - 111 mmol/L   CO2 13 (L) 22 - 32 mmol/L   Glucose, Bld 133 (H) 70 - 99 mg/dL   BUN 41 (H) 8 - 23 mg/dL   Creatinine, Ser 1.71 (H) 0.44 - 1.00 mg/dL   Calcium 9.0 8.9 - 10.3 mg/dL   GFR calc non Af Amer 31 (L) >60 mL/min   GFR calc Af Amer 36 (L) >60 mL/min    Comment: (NOTE) The eGFR has been calculated using the CKD EPI equation. This calculation has not been  validated in all clinical situations. eGFR's persistently <60 mL/min signify possible Chronic Kidney Disease.    Anion gap 12 5 - 15    Comment: Performed at [facility], [address] [place], [place] [address]  Lactic acid, plasma     Status: Abnormal   Collection Time: 03/31/18  7:49 AM  Result Value Ref Range   Lactic Acid, Venous 3.9 (HH) 0.5 - 1.9 mmol/L    Comment: CRITICAL RESULT CALLED TO, READ BACK BY AND VERIFIED WITH JENNIFER COLE AT [phone] ON 03/31/18 BY SNJ Performed at [facility], [address] [place], [place] [address]   Glucose, capillary     Status: Abnormal   Collection Time: 03/31/18  8:24 AM  Result Value Ref Range   Glucose-Capillary 112 (H) 70 - 99 mg/dL   Comment 1 Notify RN   Basic metabolic panel     Status: Abnormal   Collection Time: 03/31/18  1:12 PM  Result Value Ref Range   Sodium 142 135 - 145 mmol/L   Potassium 4.3 3.5 - 5.1 mmol/L   Chloride 123 (H) 98 - 111 mmol/L   CO2 13 (L) 22 - 32 mmol/L   Glucose, Bld 96 70 - 99 mg/dL   BUN 40 (H) 8 - 23 mg/dL   Creatinine, Ser 1.47 (H) 0.44 - 1.00 mg/dL   Calcium 8.3 (L) 8.9 - 10.3 mg/dL  GFR calc non Af Amer 37 (L) >60 mL/min   GFR calc Af Amer 43 (L) >60 mL/min    Comment: (NOTE) The eGFR has been calculated using the CKD EPI equation. This calculation has not been validated in all clinical situations. eGFR's persistently <60 mL/min signify possible Chronic Kidney Disease.    Anion gap 6 5 - 15    Comment: Performed at [facility], [place], [place] [address]  Lactic acid, plasma     Status: Abnormal   Collection Time: 03/31/18  1:12 PM  Result Value Ref Range   Lactic Acid, Venous 2.5 (HH) 0.5 - 1.9 mmol/L    Comment: CRITICAL RESULT CALLED TO, READ BACK BY AND VERIFIED WITH EMILY GANNON AT 1356 ON 03/31/18 BY SNJ. Performed at [facility], [address] [place], [place] [address]   Lactic acid, plasma     Status: Abnormal    Collection Time: 03/31/18  3:57 PM  Result Value Ref Range   Lactic Acid, Venous 3.1 (HH) 0.5 - 1.9 mmol/L    Comment: CRITICAL RESULT CALLED TO, READ BACK BY AND VERIFIED WITH  EMILY GANNON AT 6553 ON 03/31/18 BY SNJ Performed at [facility], [address] [place], [place] [address]     Current Facility-Administered Medications  Medication Dose Route Frequency Provider Last Rate Last Dose  . 0.9 %  sodium chloride infusion   Intravenous Continuous Fritzi Mandes, MD   Stopped at 03/31/18 1742  . acetaminophen (TYLENOL) tablet 650 mg  650 mg Oral Q6H PRN Fritzi Mandes, MD       Or  . acetaminophen (TYLENOL) suppository 650 mg  650 mg Rectal Q6H PRN Fritzi Mandes, MD      . acyclovir (ZOVIRAX) 700 mg in dextrose 5 % 100 mL IVPB  10 mg/kg Intravenous Q12H Demetrios Loll, MD      . aspirin tablet 325 mg  325 mg Oral Daily Demetrios Loll, MD      . atorvastatin (LIPITOR) tablet 40 mg  40 mg Oral q1800 Demetrios Loll, MD      . cefTRIAXone (ROCEPHIN) 2 g in sodium chloride 0.9 % 100 mL IVPB  2 g Intravenous Q24H Demetrios Loll, MD      . ciprofloxacin (CIPRO) IVPB 400 mg  400 mg Intravenous Q12H Demetrios Loll, MD   Stopped at 03/31/18 1042  . enoxaparin (LOVENOX) injection 40 mg  40 mg Subcutaneous Q24H Fritzi Mandes, MD   40 mg at 03/30/18 2132  . metroNIDAZOLE (FLAGYL) IVPB 500 mg  500 mg Intravenous Q8H Demetrios Loll, MD   Stopped at 03/31/18 1250  . ondansetron (ZOFRAN) tablet 4 mg  4 mg Oral Q6H PRN Fritzi Mandes, MD       Or  . ondansetron [facility]) injection 4 mg  4 mg Intravenous Q6H PRN Fritzi Mandes, MD      . polyethylene glycol (MIRALAX / GLYCOLAX) packet 17 g  17 g Oral Daily PRN Fritzi Mandes, MD      . senna (SENOKOT) tablet 8.6 mg  1 tablet Oral BID Fritzi Mandes, MD      . Derrill Memo ON 04/01/2018] vancomycin (VANCOCIN) 1,250 mg in sodium chloride 0.9 % 250 mL IVPB  1,250 mg Intravenous Q24H Demetrios Loll, MD      . vancomycin (VANCOCIN) IVPB 1000 mg/200 mL premix  1,000 mg Intravenous Once Demetrios Loll, MD         Musculoskeletal: Strength & Muscle Tone: decreased Gait & Station: unable to  stand Patient leans: N/A  Psychiatric Specialty Exam: Physical Exam  Nursing note and vitals reviewed. Constitutional: She appears well-developed and well-nourished.  HENT:  Head: Normocephalic and atraumatic.  Eyes: Pupils are equal, round, and reactive to light. Conjunctivae are normal.  Neck: Normal range of motion.  Cardiovascular: Regular rhythm and normal heart sounds.  Respiratory: Effort normal. No respiratory distress.  GI: Soft.  Musculoskeletal: Normal range of motion.  Neurological: She is alert.  Patient is only partially and irregularly responsive to verbal stimuli.  She is nodding her head back and forth rhythmically.  Normal relaxed tone in her upper extremities.  Skin: Skin is warm and dry.     Psychiatric:  The patient is only slightly responsive verbally and not displaying any interaction.    Review of Systems  Unable to perform ROS: Mental status change    Blood pressure 123/65, pulse 95, temperature 98.7 F (37.1 C), temperature source Oral, resp. rate 18, height 5' 3"  (1.6 m), weight 69.8 kg, SpO2 97 %.Body mass index is 27.26 kg/m.  General Appearance: Fairly Groomed  Eye Contact:  None  Speech:  Negative  Volume:  None  Mood:  Negative  Affect:  Negative  Thought Process:  NA  Orientation:  Negative  Thought Content:  Negative  Suicidal Thoughts:  Presumably none  Homicidal Thoughts:  Also presumably none  Memory:  Negative  Judgement:  Negative  Insight:  Negative  Psychomotor Activity:  Negative  Concentration:  Concentration: Negative  Recall:  Negative  Fund of Knowledge:  Negative  Language:  Negative  Akathisia:  Negative  Handed:  Right  AIMS (if indicated):     Assets:  Social Support  ADL's:  Impaired  Cognition:  Impaired,  Severe  Sleep:        Treatment Plan Summary: Daily contact with patient to assess and evaluate symptoms and progress  in treatment, Medication management and Plan This is a 62 year old woman who presents with acute mental status change of unclear etiology.  She does appear to have colitis and there is concern about sepsis.  She has been started on antibiotics and GI consult has been done.  Neurology consult has been ordered but is still pending while they get the: Neurology system set up.  Meanwhile this is a 62 year old woman who has what seems to me a peculiar presentation.  During the interview she was nodding her head back and forth strongly from one side to the other in a rhythmic manner.  When I spoke to her however she slowed this down and briefly stopped it.  She also on 2 or 3 occasions briefly opened her eyes as though looking to see who is speaking to her but would not follow any instructions and would not keep her eyes open consistently.  Did not show any affect of response.  I briefly lifted her arm and tested the tone which was limp no clear resistance.  At this point there is no clear psychiatric involvement.  If there were I think there would be 2 things under consideration.  One would be potentially related to substances.  The husband is telling me that the patient takes more narcotic regularly than I can find any documentation of.  This may be meaningless but it could be significant.  Husband swears that the patient never misuses her over takes any of her medicine.  She did recently start a medicine that has some anticholinergic properties although he also feels certain she would not of misuse  to this medicine.  Another consideration is whether there is some psychogenic component to this.  As always that is extremely difficult to determine and is largely a matter of exclusion unless there is a clear-cut set of psychiatric symptoms which there is not in this case.  In any case no indication for any mental health treatment but I will continue to follow up regularly and will be interested in the results of the MRI,  the neurology consult and the rest of the workup.  Disposition: No evidence of imminent risk to self or others at present.   Patient does not meet criteria for psychiatric inpatient admission.  Alethia Berthold, MD 03/31/2018 6:52 PM

## 2018-03-31 NOTE — Progress Notes (Signed)
Pharmacy Antibiotic Note  Donna Marsh is a 62 y.o. female admitted on 03/30/2018 with a suspected CNS infection.  Pharmacy has been consulted for vancomycin and acyclovir dosing.  Plan: Vancomycin 1250 IV every 24 hours starting 8 hours after first 1000mg  dose. K 0.041, T1/2: 17h, Vd 49L, calculated concentrations at steady-state: 42.4/16.5 mcg/mL, Goal trough 15-20 mcg/mL, Vt prior to 4th dose Acyclovir: 10 mg/kg (700mg ) IV every 12 hours  Height: 5\' 3"  (160 cm) Weight: 153 lb 14.4 oz (69.8 kg) IBW/kg (Calculated) : 52.4  Temp (24hrs), Avg:98.1 F (36.7 C), Min:96.1 F (35.6 C), Max:99.3 F (37.4 C)  Recent Labs  Lab 03/30/18 1228 03/30/18 1433 03/30/18 1735 03/31/18 0353 03/31/18 0749 03/31/18 1312 03/31/18 1557  WBC 23.7*  --   --   --   --   --   --   CREATININE 1.59*  --   --  1.71*  --  1.47*  --   LATICACIDVEN 6.0* 5.2* 5.7*  --  3.9* 2.5* 3.1*    Estimated Creatinine Clearance: 37.7 mL/min (A) (by C-G formula based on SCr of 1.47 mg/dL (H)).    No Known Allergies  Thank you for allowing pharmacy to be a part of this patient's care.  05/31/18, PharmD 03/31/2018 6:18 PM

## 2018-03-31 NOTE — Progress Notes (Addendum)
[facility] [place] [facility]   PATIENT NAME: Donna Marsh    MR#:  [id number]  DATE OF BIRTH:  1956-05-05  SUBJECTIVE:  CHIEF COMPLAINT:   Chief Complaint  Patient presents with  . Altered Mental Status   The patient is confused, not responsive to verbal stimuli.  Tachycardia at 120s.  Blood pressure is soft.  Given normal saline bolus. REVIEW OF SYSTEMS:  Review of Systems  Unable to perform ROS: Mental status change    DRUG ALLERGIES:  No Known Allergies VITALS:  Blood pressure 106/74, pulse (!) 118, temperature 99.3 F (37.4 C), temperature source Oral, resp. rate 20, height 5\' 3"  (1.6 m), weight 69.8 kg, SpO2 97 %. PHYSICAL EXAMINATION:  Physical Exam  Constitutional: She appears well-developed.  HENT:  Head: Normocephalic.  Eyes: Pupils are equal, round, and reactive to light. Conjunctivae and EOM are normal. No scleral icterus.  Neck: Neck supple. No JVD present. No tracheal deviation present.  Cardiovascular: Normal rate, regular rhythm and normal heart sounds. Exam reveals no gallop.  No murmur heard. Pulmonary/Chest: Effort normal and breath sounds normal. No respiratory distress. She has no wheezes. She has no rales.  Abdominal: Soft. Bowel sounds are normal. She exhibits no distension. There is no tenderness. There is no rebound.  Musculoskeletal: She exhibits no edema or tenderness.  Neurological:  The patient is not responsive to verbal stimuli but opened eyes sometimes.  Skin: No rash noted. No erythema.   LABORATORY PANEL:  Female CBC Recent Labs  Lab 03/30/18 1228  WBC 23.7*  HGB 17.5*  HCT 54.7*  PLT 390   ------------------------------------------------------------------------------------------------------------------ Chemistries  Recent Labs  Lab 03/30/18 1228 03/31/18 0353  NA 135 141  K 5.4* 6.3*  CL 106 116*  CO2 17* 13*  GLUCOSE 197* 133*  BUN 33* 41*  CREATININE 1.59* 1.71*  CALCIUM 10.8* 9.0  AST  65*  --   ALT 39  --   ALKPHOS 111  --   BILITOT 1.6*  --    RADIOLOGY:  Ct Head Wo Contrast  Result Date: 03/30/2018 CLINICAL DATA:  Altered mental status/lethargy.  Vomiting. EXAM: CT HEAD WITHOUT CONTRAST TECHNIQUE: Contiguous axial images were obtained from the base of the skull through the vertex without intravenous contrast. COMPARISON:  Head CT Nov 28, 2016 and brain MRI Nov 29, 2017 FINDINGS: Brain: The ventricles are normal in size and configuration. There is borderline frontal atrophy bilaterally, stable. There is no intracranial mass, hemorrhage, extra-axial fluid collection, or midline shift. Gray-white compartments appear normal. No evident acute infarct. Vascular: No hyperdense vessel evident. There is calcification in the right carotid siphon region. Skull: Bony calvarium appears intact. Sinuses/Orbits: Visualized paranasal sinuses are clear. Visualized orbits appear symmetric bilaterally. Other: Visualized mastoid air cells are clear. IMPRESSION: Slight frontal atrophy. Ventricles normal in size and configuration. Gray-white compartments appear normal. No mass or hemorrhage. There is calcification in the right carotid siphon region. Electronically Signed   By: Bretta Bang III M.D.   On: 03/30/2018 13:09   Ct Angio Chest Pe W And/or Wo Contrast  Result Date: 03/30/2018 CLINICAL DATA:  Lethargy, nausea, vomiting, abdominal distension, diaphoresis, hypoxia, shock, question pulmonary embolism Hip EXAM: CT ANGIOGRAPHY CHEST CT ABDOMEN AND PELVIS WITH CONTRAST TECHNIQUE: Multidetector CT imaging of the chest was performed using the standard protocol during bolus administration of intravenous contrast. Multiplanar CT image reconstructions and MIPs were obtained to evaluate the vascular anatomy. Multidetector CT imaging of the abdomen and pelvis was  performed using the standard protocol during bolus administration of intravenous contrast. CONTRAST:  72mL OMNIPAQUE IOHEXOL 350 MG/ML SOLN IV. No  oral contrast. COMPARISON:  CT abdomen and pelvis 02/27/2018 FINDINGS: CTA CHEST FINDINGS Cardiovascular: Aorta normal caliber without aneurysm or dissection. Pulmonary arteries well opacified and patent. No evidence of pulmonary embolism. No pericardial effusion. Mediastinum/Nodes: Moderate-sized hiatal hernia. Esophagus otherwise unremarkable. Base of cervical region normal appearance. No thoracic adenopathy. Lungs/Pleura: Bibasilar atelectasis. 3 mm subpleural nodule RIGHT middle lobe image 41 unchanged no infiltrate, pleural effusion or pneumothorax. Musculoskeletal: Bones mildly demineralized without acute abnormality Review of the MIP images confirms the above findings. CT ABDOMEN and PELVIS FINDINGS Hepatobiliary: Gallbladder surgically absent. Liver normal appearance Pancreas: Normal appearance Spleen: Normal appearance Adrenals/Urinary Tract: Adrenal glands normal appearance. LEFT renal cysts unchanged. Kidneys, ureters, and bladder otherwise normal appearance Stomach/Bowel: Appendix not localized. Moderate hiatal hernia. Remainder of stomach decompressed. Small bowel loops unremarkable. Mobile cecum with tip located in the LEFT upper quadrant. Decompressed terminal ileum. Increased stool in rectosigmoid colon. Diffuse dilatation of the colon from cecal tip to distal ascending colon. Wall thickening of the colon from sigmoid level to at least the transverse colon consistent with colitis, associated with hyperemia it of the associated mesentery and minimal pericolic infiltration. Proximal 2/3 of the colon is fluid-filled. No definite point of obstruction identified. Vascular/Lymphatic: Scattered atherosclerotic calcifications aorta. No adenopathy. Reproductive: Uterus surgically absent. Small calcifications in the ovaries bilaterally. Other: No free air or free fluid.  No hernia. Musculoskeletal: Prior lumbar fusion L4-S1.  Bones demineralized. Review of the MIP images confirms the above findings.  IMPRESSION: Dilated colon fluid-filled proximally and with increased stool in rectosigmoid colon. Mild colonic wall thickening is identified at the distal half of the colon with hyperemia of the mesocolon and minimal pericolic infiltrative changes consistent with colitis; differential diagnosis would include infection and inflammatory bowel disease, ischemia considered less likely. No evidence of pulmonary embolism or significant intrathoracic process. Moderate-sized hiatal hernia. Electronically Signed   By: Ulyses Southward M.D.   On: 03/30/2018 14:21   Ct Abdomen Pelvis W Contrast  Result Date: 03/30/2018 CLINICAL DATA:  Lethargy, nausea, vomiting, abdominal distension, diaphoresis, hypoxia, shock, question pulmonary embolism Hip EXAM: CT ANGIOGRAPHY CHEST CT ABDOMEN AND PELVIS WITH CONTRAST TECHNIQUE: Multidetector CT imaging of the chest was performed using the standard protocol during bolus administration of intravenous contrast. Multiplanar CT image reconstructions and MIPs were obtained to evaluate the vascular anatomy. Multidetector CT imaging of the abdomen and pelvis was performed using the standard protocol during bolus administration of intravenous contrast. CONTRAST:  13mL OMNIPAQUE IOHEXOL 350 MG/ML SOLN IV. No oral contrast. COMPARISON:  CT abdomen and pelvis 02/27/2018 FINDINGS: CTA CHEST FINDINGS Cardiovascular: Aorta normal caliber without aneurysm or dissection. Pulmonary arteries well opacified and patent. No evidence of pulmonary embolism. No pericardial effusion. Mediastinum/Nodes: Moderate-sized hiatal hernia. Esophagus otherwise unremarkable. Base of cervical region normal appearance. No thoracic adenopathy. Lungs/Pleura: Bibasilar atelectasis. 3 mm subpleural nodule RIGHT middle lobe image 41 unchanged no infiltrate, pleural effusion or pneumothorax. Musculoskeletal: Bones mildly demineralized without acute abnormality Review of the MIP images confirms the above findings. CT ABDOMEN and  PELVIS FINDINGS Hepatobiliary: Gallbladder surgically absent. Liver normal appearance Pancreas: Normal appearance Spleen: Normal appearance Adrenals/Urinary Tract: Adrenal glands normal appearance. LEFT renal cysts unchanged. Kidneys, ureters, and bladder otherwise normal appearance Stomach/Bowel: Appendix not localized. Moderate hiatal hernia. Remainder of stomach decompressed. Small bowel loops unremarkable. Mobile cecum with tip located in the LEFT upper quadrant. Decompressed terminal ileum.  Increased stool in rectosigmoid colon. Diffuse dilatation of the colon from cecal tip to distal ascending colon. Wall thickening of the colon from sigmoid level to at least the transverse colon consistent with colitis, associated with hyperemia it of the associated mesentery and minimal pericolic infiltration. Proximal 2/3 of the colon is fluid-filled. No definite point of obstruction identified. Vascular/Lymphatic: Scattered atherosclerotic calcifications aorta. No adenopathy. Reproductive: Uterus surgically absent. Small calcifications in the ovaries bilaterally. Other: No free air or free fluid.  No hernia. Musculoskeletal: Prior lumbar fusion L4-S1.  Bones demineralized. Review of the MIP images confirms the above findings. IMPRESSION: Dilated colon fluid-filled proximally and with increased stool in rectosigmoid colon. Mild colonic wall thickening is identified at the distal half of the colon with hyperemia of the mesocolon and minimal pericolic infiltrative changes consistent with colitis; differential diagnosis would include infection and inflammatory bowel disease, ischemia considered less likely. No evidence of pulmonary embolism or significant intrathoracic process. Moderate-sized hiatal hernia. Electronically Signed   By: Ulyses Southward M.D.   On: 03/30/2018 14:21   ASSESSMENT AND PLAN:   Prentiss Hammett  is a 62 y.o. female with a known history of chronic back pain gets narcotics from her primary care  physician, h/o Alopecia, history of recurrent UTI, depression comes to the emergency room brought in by EMS after patient was found to be very lethargic diaphoretic and collapsed in the bathroom at home. She did episode of vomiting. According to the husband she is not been eating well for last couple days.  1. sepsis suspected due to acute colitis -presented with altered mental status, diaphoresis and very weak -CT abdomen suggestive of colitis. No pneumonia noted on CT chest. She is on IV Unasyn, changed to Cipro and Flagyl. N.p.o. with IV fluid support.  Pain control.  Follow-up with CBC and blood culture.  GI consult.  2. Lactic acidosis due to above.  Follow-up lactic acid level.  Acute metabolic encephalopathy due to above.  Aspiration fall precaution. Continue treatment as above.  3. h/o constipation suspected narcotic induced which patient takes for back pain -stool softener  4. Anxiety depression -resumed home meds when patient is awake  5. Acute renal failure due to dehydration.   Continue IV hydration. Baseline creatinine is 1.1 -BMP in am  Hyperkalemia.  Potassium 6.3 today.  Given calcium gluconate, D50, NovoLog and Kayexalate.  Repeat BMP.  Discussed with floor RN, rapid response RN. All the records are reviewed and case discussed with Care Management/Social Worker. Management plans discussed with the patient's husband and they are in agreement.  CODE STATUS: Full Code  TOTAL TIME TAKING CARE OF THIS PATIENT: 46 minutes.   More than 50% of the time was spent in counseling/coordination of care: YES  POSSIBLE D/C IN 3 DAYS, DEPENDING ON CLINICAL CONDITION.   Shaune Pollack M.D on 03/31/2018 at 12:59 PM  Between 7am to 6pm - Pager - [phone]  After 6pm go to [email or web address] - Therapist, nutritional Hospitalists

## 2018-03-31 NOTE — Progress Notes (Addendum)
Dr. Norma Fredrickson, need a stat neurology consult. I paged [phone] (per RN Irving Burton) for neurology consult.  But per the neurologist group operator, Dr. Daisy Blossom does not do consult. Per 1A secretory, Dr. Amada Jupiter is on-call neurologist. I paged [phone], waiting for callback. Dr. Nicholes Calamity call back, but he said he is not on-call for consult. Per ED secretary, Dr. Amada Jupiter is on call.  I paged him again. Per Dr. Amada Jupiter, need to call telemetry neurologist for consult. I called telemetry neurologist at [phone], waiting for callback. Telemetry neurologist called back and said he is going to see the patient. Another telemetry neurologist Dr. Melynda Ripple called back and that she is going to see the patient when video cart is ready. I called Irving Burton, floor RN, she is going to get video card from the ED.  Time spent about 47 minutes.

## 2018-03-31 NOTE — Significant Event (Signed)
Rapid Response Event Note  Overview: Time Called: 0830 Arrival Time: 9323 Event Type: Neurologic  Initial Focused Assessment: CALLED FOR RAPID RESPONSE FOR SEPTIC PT WITH DECREASED LOC.   Interventions: VSS, DR CHEN IN TO ASSESS PT. WILL GIVE 1 LITER BOLUS AND CONTINUE TO MONITOR ON FLOOR.  Plan of Care (if not transferred): 1 LITER BOLUS AND CONTINUE TO MONITOR ON FLOOR, RN TO CALL IF FURTHER ASSISTANCE NEEDED.  Event Summary: Name of Physician Notified: DR Imogene Burn at [phone]    at    Outcome: Stayed in room and stabalized  Event End Time: 0859  Darryll Raju A

## 2018-03-31 NOTE — Progress Notes (Signed)
Rectal tube is not draining.  Multiple large Bm's leaking around tube.  Tube pulled out per patient.  Negative for cdiff

## 2018-03-31 NOTE — Consult Note (Signed)
[facility] GI Inpatient Consult Note   Jamey Reas, M.D.  Reason for Consult: Sepsis, colitis   Attending Requesting Consult: Shaune Pollack, M.D.  Outpatient Primary Physician: Dortha Kern  History of Present Illness: Donna Marsh is a 62 y.o. female with a history of alopecia universalis, anxiety, arthritis and depression who presents to the ED with reported diarrhea, altered mental status.  Diarrhea seems of slowing down the day.  Stool for C. difficile was negative.  Patient is completely non-conversive, exhibiting involuntary movements of the head, neck and eyes and is not responding to commands, verbal or tactile stimuli. According to the records, CT scan and MRI of the head were negative for any acute process.  Patient has been afebrile.  Clearly no neck stiffness present at this time.  Past Medical History:  Past Medical History:  Diagnosis Date  . Alopecia universalis   . Anemia    in 1979  . Anginal pain (HCC)    hx of CP in 2013, went to the hospital, everything was negative  . Anxiety   . Arthritis    RA AND OA .    Marland Kitchen Blood transfusion 1979   WITH PREGNANCY  . Depression   . Edema    HX OF SWELLING OF FEET, LEGS, HANDS AND FACE--TAKES TRIAMTERENE/HCTZ FOR PREVENTION--DOES NOT HAVE HYPERTENSION AND HAS BEEN TOLD B/P USUALLY LOW  . GERD (gastroesophageal reflux disease)    OCCAS--OTC MED IF ANYTHING NEEDED  . Headache(784.0)    HX OF MIGRAINES  . Hypercholesterolemia   . PONV (postoperative nausea and vomiting)   . URI (upper respiratory infection)    URI IN OCT 2012--PT FEELS CLEAR NOW--DOES NOT USUALLY HAVE ANY PROBLEMS WITH URI  . UTI (lower urinary tract infection)    FREQUENTLY--JUST FINISHED ANTIBIOTIC SAT 06/25/11 FOR UTI  . Varicose veins    LEFT LEG    Problem List: Patient Active Problem List   Diagnosis Date Noted  . Colitis 03/30/2018  . TIA (transient ischemic attack) 11/28/2016  . Spinal stenosis 01/08/2015  . 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 ABDOMINAL HYSTERECTOMY    . CHOLECYSTECTOMY  2011  . JOINT REPLACEMENT  JAN 2006   LEFT TOTAL KNEE ARTHROPLASTY  A  . JOINT REPLACEMENT  JAN 2012   RIGHT TOTAL KNEE ARTHROPLASTY  . TOTAL KNEE REVISION  07/04/2011   Procedure: bilateral TOTAL KNEE REVISION;  Surgeon: Shelda Pal;  Location: [facility];  Service: Orthopedics;  Laterality: Right;  Right Total Knee Revision with Scar Debridement/Patella Revision with Poly Exchange    Allergies: No Known Allergies  Home Medications: Medications Prior to Admission  Medication Sig Dispense Refill Last Dose  . acetaminophen-codeine (TYLENOL #3) 300-30 MG tablet Take 1 tablet by mouth every 6 (six) hours as needed for moderate pain or severe pain. Take 1 tablet every 6 to 8 hours as needed for pain.   prn at prn  . ALPRAZolam (XANAX) 1 MG tablet Take 1 mg by mouth 3 (three) times daily.    03/30/2018 at Unknown time  . aspirin EC 81 MG tablet Take 1 tablet (81 mg total) by mouth daily. 30 tablet 2 Past Week at Unknown time  . baclofen (LIORESAL) 20 MG tablet Take 1 tablet (20 mg total) by mouth 2 (two) times daily as needed for muscle spasms. (Patient taking differently: Take 20 mg by mouth 3 (three) times  daily. ) 30 each 0 03/29/2018 at Unknown time  . escitalopram (LEXAPRO) 20 MG tablet Take 20 mg by mouth daily.    03/30/2018 at Unknown time  . solifenacin (VESICARE) 5 MG tablet Take 5 mg by mouth daily.  11 03/30/2018 at Unknown time  . spironolactone (ALDACTONE) 50 MG tablet Take 150-200 mg by mouth daily.    03/30/2018 at Unknown time  . traMADol (ULTRAM) 50 MG tablet Take 1 tablet (50 mg total) by mouth every 6 (six) hours as needed for moderate pain (headache). (Patient not taking: Reported on 03/30/2018) 12 tablet 0 Completed Course at Unknown time   Home medication reconciliation was completed with the patient.   Scheduled Inpatient  Medications:   . aspirin EC  81 mg Oral Daily  . enoxaparin (LOVENOX) injection  40 mg Subcutaneous Q24H  . senna  1 tablet Oral BID    Continuous Inpatient Infusions:   . sodium chloride 125 mL/hr at 03/31/18 1506  . ciprofloxacin Stopped (03/31/18 1042)  . metronidazole Stopped (03/31/18 1250)    PRN Inpatient Medications:  acetaminophen **OR** acetaminophen, ondansetron **OR** ondansetron (ZOFRAN) IV, polyethylene glycol  Family History: family history includes Breast cancer in her mother; CAD in her father; Stroke in her paternal grandfather.   GI Family History: Negative  Social History:   reports that she has never smoked. She has never used smokeless tobacco. She reports that she drinks alcohol. She reports that she does not use drugs. The patient denies ETOH, tobacco, or drug use.    Review of Systems: Review of Systems - unobtainable as patient is non-verbal.  Physical Examination: BP 106/74 (BP Location: Left Arm)   Pulse (!) 118   Temp 99.3 F (37.4 C) (Oral)   Resp 20   Ht 5\' 3"  (1.6 m)   Wt 69.8 kg   SpO2 97%   BMI 27.26 kg/m  Physical Exam  Constitutional: She appears well-developed and well-nourished.  HENT:  Head: Normocephalic and atraumatic.  Neck: Normal range of motion. Neck supple. No thyromegaly present.  Cardiovascular: Normal rate.  Pulmonary/Chest: She is in respiratory distress.  Abdominal: Soft. She exhibits no distension. There is no tenderness.  Musculoskeletal:       Right shoulder: She exhibits no tenderness, no swelling, no deformity and no pain.  Neurological: GCS eye subscore is 2. GCS verbal subscore is 1. GCS motor subscore is 1.  Skin: Skin is warm and dry.    Data: Lab Results  Component Value Date   WBC 23.7 (H) 03/30/2018   HGB 17.5 (H) 03/30/2018   HCT 54.7 (H) 03/30/2018   MCV 91.4 03/30/2018   PLT 390 03/30/2018   Recent Labs  Lab 03/30/18 1228  HGB 17.5*   Lab Results  Component Value Date   NA 142  03/31/2018   K 4.3 03/31/2018   CL 123 (H) 03/31/2018   CO2 13 (L) 03/31/2018   BUN 40 (H) 03/31/2018   CREATININE 1.47 (H) 03/31/2018   Lab Results  Component Value Date   ALT 39 03/30/2018   AST 65 (H) 03/30/2018   ALKPHOS 111 03/30/2018   BILITOT 1.6 (H) 03/30/2018   Recent Labs  Lab 03/30/18 1228  APTT 26  INR 1.05   CBC Latest Ref Rng & Units 03/30/2018 11/29/2016 11/28/2016  WBC 3.6 - 11.0 K/uL 23.7(H) 6.7 6.8  Hemoglobin 12.0 - 16.0 g/dL 17.5(H) 15.0 14.6  Hematocrit 35.0 - 47.0 % 54.7(H) 44.6 45.1  Platelets 150 - 440 K/uL 390 280  268    STUDIES: Ct Head Wo Contrast  Result Date: 03/30/2018 CLINICAL DATA:  Altered mental status/lethargy.  Vomiting. EXAM: CT HEAD WITHOUT CONTRAST TECHNIQUE: Contiguous axial images were obtained from the base of the skull through the vertex without intravenous contrast. COMPARISON:  Head CT Nov 28, 2016 and brain MRI Nov 29, 2017 FINDINGS: Brain: The ventricles are normal in size and configuration. There is borderline frontal atrophy bilaterally, stable. There is no intracranial mass, hemorrhage, extra-axial fluid collection, or midline shift. Gray-white compartments appear normal. No evident acute infarct. Vascular: No hyperdense vessel evident. There is calcification in the right carotid siphon region. Skull: Bony calvarium appears intact. Sinuses/Orbits: Visualized paranasal sinuses are clear. Visualized orbits appear symmetric bilaterally. Other: Visualized mastoid air cells are clear. IMPRESSION: Slight frontal atrophy. Ventricles normal in size and configuration. Gray-white compartments appear normal. No mass or hemorrhage. There is calcification in the right carotid siphon region. Electronically Signed   By: Bretta Bang III M.D.   On: 03/30/2018 13:09   Ct Angio Chest Pe W And/or Wo Contrast  Result Date: 03/30/2018 CLINICAL DATA:  Lethargy, nausea, vomiting, abdominal distension, diaphoresis, hypoxia, shock, question pulmonary embolism Hip  EXAM: CT ANGIOGRAPHY CHEST CT ABDOMEN AND PELVIS WITH CONTRAST TECHNIQUE: Multidetector CT imaging of the chest was performed using the standard protocol during bolus administration of intravenous contrast. Multiplanar CT image reconstructions and MIPs were obtained to evaluate the vascular anatomy. Multidetector CT imaging of the abdomen and pelvis was performed using the standard protocol during bolus administration of intravenous contrast. CONTRAST:  75mL OMNIPAQUE IOHEXOL 350 MG/ML SOLN IV. No oral contrast. COMPARISON:  CT abdomen and pelvis 02/27/2018 FINDINGS: CTA CHEST FINDINGS Cardiovascular: Aorta normal caliber without aneurysm or dissection. Pulmonary arteries well opacified and patent. No evidence of pulmonary embolism. No pericardial effusion. Mediastinum/Nodes: Moderate-sized hiatal hernia. Esophagus otherwise unremarkable. Base of cervical region normal appearance. No thoracic adenopathy. Lungs/Pleura: Bibasilar atelectasis. 3 mm subpleural nodule RIGHT middle lobe image 41 unchanged no infiltrate, pleural effusion or pneumothorax. Musculoskeletal: Bones mildly demineralized without acute abnormality Review of the MIP images confirms the above findings. CT ABDOMEN and PELVIS FINDINGS Hepatobiliary: Gallbladder surgically absent. Liver normal appearance Pancreas: Normal appearance Spleen: Normal appearance Adrenals/Urinary Tract: Adrenal glands normal appearance. LEFT renal cysts unchanged. Kidneys, ureters, and bladder otherwise normal appearance Stomach/Bowel: Appendix not localized. Moderate hiatal hernia. Remainder of stomach decompressed. Small bowel loops unremarkable. Mobile cecum with tip located in the LEFT upper quadrant. Decompressed terminal ileum. Increased stool in rectosigmoid colon. Diffuse dilatation of the colon from cecal tip to distal ascending colon. Wall thickening of the colon from sigmoid level to at least the transverse colon consistent with colitis, associated with hyperemia  it of the associated mesentery and minimal pericolic infiltration. Proximal 2/3 of the colon is fluid-filled. No definite point of obstruction identified. Vascular/Lymphatic: Scattered atherosclerotic calcifications aorta. No adenopathy. Reproductive: Uterus surgically absent. Small calcifications in the ovaries bilaterally. Other: No free air or free fluid.  No hernia. Musculoskeletal: Prior lumbar fusion L4-S1.  Bones demineralized. Review of the MIP images confirms the above findings. IMPRESSION: Dilated colon fluid-filled proximally and with increased stool in rectosigmoid colon. Mild colonic wall thickening is identified at the distal half of the colon with hyperemia of the mesocolon and minimal pericolic infiltrative changes consistent with colitis; differential diagnosis would include infection and inflammatory bowel disease, ischemia considered less likely. No evidence of pulmonary embolism or significant intrathoracic process. Moderate-sized hiatal hernia. Electronically Signed   By: Angelyn Punt.D.  On: 03/30/2018 14:21   Ct Abdomen Pelvis W Contrast  Result Date: 03/30/2018 CLINICAL DATA:  Lethargy, nausea, vomiting, abdominal distension, diaphoresis, hypoxia, shock, question pulmonary embolism Hip EXAM: CT ANGIOGRAPHY CHEST CT ABDOMEN AND PELVIS WITH CONTRAST TECHNIQUE: Multidetector CT imaging of the chest was performed using the standard protocol during bolus administration of intravenous contrast. Multiplanar CT image reconstructions and MIPs were obtained to evaluate the vascular anatomy. Multidetector CT imaging of the abdomen and pelvis was performed using the standard protocol during bolus administration of intravenous contrast. CONTRAST:  25mL OMNIPAQUE IOHEXOL 350 MG/ML SOLN IV. No oral contrast. COMPARISON:  CT abdomen and pelvis 02/27/2018 FINDINGS: CTA CHEST FINDINGS Cardiovascular: Aorta normal caliber without aneurysm or dissection. Pulmonary arteries well opacified and patent. No  evidence of pulmonary embolism. No pericardial effusion. Mediastinum/Nodes: Moderate-sized hiatal hernia. Esophagus otherwise unremarkable. Base of cervical region normal appearance. No thoracic adenopathy. Lungs/Pleura: Bibasilar atelectasis. 3 mm subpleural nodule RIGHT middle lobe image 41 unchanged no infiltrate, pleural effusion or pneumothorax. Musculoskeletal: Bones mildly demineralized without acute abnormality Review of the MIP images confirms the above findings. CT ABDOMEN and PELVIS FINDINGS Hepatobiliary: Gallbladder surgically absent. Liver normal appearance Pancreas: Normal appearance Spleen: Normal appearance Adrenals/Urinary Tract: Adrenal glands normal appearance. LEFT renal cysts unchanged. Kidneys, ureters, and bladder otherwise normal appearance Stomach/Bowel: Appendix not localized. Moderate hiatal hernia. Remainder of stomach decompressed. Small bowel loops unremarkable. Mobile cecum with tip located in the LEFT upper quadrant. Decompressed terminal ileum. Increased stool in rectosigmoid colon. Diffuse dilatation of the colon from cecal tip to distal ascending colon. Wall thickening of the colon from sigmoid level to at least the transverse colon consistent with colitis, associated with hyperemia it of the associated mesentery and minimal pericolic infiltration. Proximal 2/3 of the colon is fluid-filled. No definite point of obstruction identified. Vascular/Lymphatic: Scattered atherosclerotic calcifications aorta. No adenopathy. Reproductive: Uterus surgically absent. Small calcifications in the ovaries bilaterally. Other: No free air or free fluid.  No hernia. Musculoskeletal: Prior lumbar fusion L4-S1.  Bones demineralized. Review of the MIP images confirms the above findings. IMPRESSION: Dilated colon fluid-filled proximally and with increased stool in rectosigmoid colon. Mild colonic wall thickening is identified at the distal half of the colon with hyperemia of the mesocolon and minimal  pericolic infiltrative changes consistent with colitis; differential diagnosis would include infection and inflammatory bowel disease, ischemia considered less likely. No evidence of pulmonary embolism or significant intrathoracic process. Moderate-sized hiatal hernia. Electronically Signed   By: Ulyses Southward M.D.   On: 03/30/2018 14:21   Dg Chest Port 1 View  Result Date: 03/30/2018 CLINICAL DATA:  Altered mental status. EXAM: PORTABLE CHEST 1 VIEW COMPARISON:  Chest x-ray 01/06/2015. FINDINGS: Mediastinum and hilar structures normal. Low lung volumes with mild upper lobe and bibasilar atelectasis. Small bilateral pleural effusions. Sliding hiatal hernia. IMPRESSION: 1.  Sliding hiatal hernia. 2. Low lung volumes with mild right upper lobe and bibasilar atelectasis. Small bilateral pleural effusions. Electronically Signed   By: Maisie Fus  Register   On: 03/30/2018 12:41   @IMAGES @  Assessment: 1. Diarrhea, acute - C dif negative, but needs other potential pathogens ruled out.  2. Altered mental status - Marked stupor with loss of verbal and motor response to voice and tactile stimuli, which is very concerning. ?Encephalitis.  3. Abnormal CT of the abdomen/pelvis - Mobile cecum with tip in the LUQ quadrant. Proximal 2/3 of colon was fluid filled.  NO obvious obstruction.   Recommendations: 1. Check GI panel. 2. Neuro consult ASAP to  evaluate mental status given acute loss of most all mental and verbal faculties. 3. Will follow along.   Thank you for the consult. Please call with questions or concerns.  Rosina Lowenstein, "Mellody Dance MD [facility] [address] [place], [place] [address] [phone]  03/31/2018 4:30 PM

## 2018-03-31 NOTE — Progress Notes (Signed)
Pt with heart rate in the 130's and lactic down to 2.3 spoke with Dr. Anne Hahn MD to place orders.

## 2018-03-31 NOTE — Progress Notes (Signed)
Spoke with Dr. Anne Hahn regarding MRI results. Pt also has Lovenox ordered and MD would like to hold that tonight due to MRI results we will imp lament SCD's.

## 2018-04-01 ENCOUNTER — Inpatient Hospital Stay: Payer: Medicare Other

## 2018-04-01 LAB — GASTROINTESTINAL PANEL BY PCR, STOOL (REPLACES STOOL CULTURE)
ADENOVIRUS F40/41: NOT DETECTED
ASTROVIRUS: NOT DETECTED
CAMPYLOBACTER SPECIES: NOT DETECTED
CRYPTOSPORIDIUM: NOT DETECTED
CYCLOSPORA CAYETANENSIS: NOT DETECTED
ENTEROPATHOGENIC E COLI (EPEC): NOT DETECTED
Entamoeba histolytica: NOT DETECTED
Enteroaggregative E coli (EAEC): NOT DETECTED
Enterotoxigenic E coli (ETEC): NOT DETECTED
Giardia lamblia: NOT DETECTED
Norovirus GI/GII: NOT DETECTED
PLESIMONAS SHIGELLOIDES: NOT DETECTED
ROTAVIRUS A: NOT DETECTED
SHIGA LIKE TOXIN PRODUCING E COLI (STEC): NOT DETECTED
Salmonella species: NOT DETECTED
Sapovirus (I, II, IV, and V): NOT DETECTED
Shigella/Enteroinvasive E coli (EIEC): NOT DETECTED
Vibrio cholerae: NOT DETECTED
Vibrio species: NOT DETECTED
Yersinia enterocolitica: NOT DETECTED

## 2018-04-01 LAB — HEMOGLOBIN: Hemoglobin: 12.4 g/dL (ref 12.0–16.0)

## 2018-04-01 LAB — LACTIC ACID, PLASMA
LACTIC ACID, VENOUS: 4 mmol/L — AB (ref 0.5–1.9)
LACTIC ACID, VENOUS: 3 mmol/L — AB (ref 0.5–1.9)
Lactic Acid, Venous: 4.3 mmol/L (ref 0.5–1.9)
Lactic Acid, Venous: 3.6 mmol/L (ref 0.5–1.9)
Lactic Acid, Venous: 2.8 mmol/L (ref 0.5–1.9)

## 2018-04-01 LAB — BASIC METABOLIC PANEL
ANION GAP: 8 (ref 5–15)
BUN: 36 mg/dL — AB (ref 8–23)
CHLORIDE: 120 mmol/L — AB (ref 98–111)
CO2: 16 mmol/L — ABNORMAL LOW (ref 22–32)
Calcium: 8 mg/dL — ABNORMAL LOW (ref 8.9–10.3)
Creatinine, Ser: 1.38 mg/dL — ABNORMAL HIGH (ref 0.44–1.00)
GFR, EST AFRICAN AMERICAN: 47 mL/min — AB (ref >60)
GFR, EST NON AFRICAN AMERICAN: 40 mL/min — AB (ref >60)
Glucose, Bld: 111 mg/dL — ABNORMAL HIGH (ref 70–99)
Potassium: 3.9 mmol/L (ref 3.5–5.1)
SODIUM: 144 mmol/L (ref 135–145)

## 2018-04-01 LAB — CBC
HCT: 45 % (ref 35.0–47.0)
Hemoglobin: 14.7 g/dL (ref 12.0–16.0)
MCH: 29.5 pg (ref 26.0–34.0)
MCHC: 32.8 g/dL (ref 32.0–36.0)
MCV: 90.2 fL (ref 80.0–100.0)
PLATELETS: 266 10*3/uL (ref 150–440)
RBC: 4.99 MIL/uL (ref 3.80–5.20)
RDW: 18.1 % — AB (ref 11.5–14.5)
WBC: 16.4 10*3/uL — AB (ref 3.6–11.0)

## 2018-04-01 MED ORDER — SODIUM CHLORIDE 0.9 % IV BOLUS
1000.0000 mL | Freq: Once | INTRAVENOUS | Status: AC
  Administered 2018-04-01: 1000 mL via INTRAVENOUS

## 2018-04-01 MED ORDER — FAMOTIDINE IN NACL 20-0.9 MG/50ML-% IV SOLN
20.0000 mg | Freq: Two times a day (BID) | INTRAVENOUS | Status: DC
  Administered 2018-04-01 – 2018-04-02 (×3): 20 mg via INTRAVENOUS
  Filled 2018-04-01 (×3): qty 50

## 2018-04-01 NOTE — Progress Notes (Signed)
Pt with new lactic acid level of 4.3. Spoke with Dr. Sheryle Hail he would like to up her normal saline to 165ml/hr.

## 2018-04-01 NOTE — Consult Note (Signed)
NEUROLOGY TEAM CONSULT NOTE    HPI: 62 year old female PMHx alopecia, anxiety, depression, frequent UTIs, chronic pain on chronic opioids and benzos and by notes tylenol with codeine as well,  admitted with encephalopathy, vomiting, diarrhea, colitis, sepsis. CT was negative, MRI showed possible small SDH but lots o fmotion artifact, will repeat CT head. Today she is improved with medical management. Husband is at bedside.   OBJECTIVE Vitals:   03/31/18 2208 03/31/18 2333 04/01/18 0502 04/01/18 0810  BP:  109/75 (!) 156/83 (!) 152/73  Pulse: (!) 131 (!) 116 93 83  Resp:  20 17 18   Temp:  98.6 F (37 C) 98.2 F (36.8 C) 98.6 F (37 C)  TempSrc:  Axillary Oral Axillary  SpO2:  96% 100% 98%  Weight:      Height:        CBC:  Recent Labs  Lab 03/30/18 1228 04/01/18 0209  WBC 23.7* 16.4*  NEUTROABS 21.3*  --   HGB 17.5* 14.7  HCT 54.7* 45.0  MCV 91.4 90.2  PLT 390 266    Basic Metabolic Panel:  Recent Labs  Lab 03/31/18 1312 04/01/18 0209  NA 142 144  K 4.3 3.9  CL 123* 120*  CO2 13* 16*  GLUCOSE 96 111*  BUN 40* 36*  CREATININE 1.47* 1.38*  CALCIUM 8.3* 8.0*    Lipid Panel:     Component Value Date/Time   CHOL 189 11/28/2016 1247   CHOL 196 04/24/2012 0103   TRIG 128 11/28/2016 1247   TRIG 251 (H) 04/24/2012 0103   HDL 40 (L) 11/28/2016 1247   HDL 48 04/24/2012 0103   CHOLHDL 4.7 11/28/2016 1247   VLDL 26 11/28/2016 1247   VLDL 50 (H) 04/24/2012 0103   LDLCALC 123 (H) 11/28/2016 1247   LDLCALC 98 04/24/2012 0103   HgbA1c:  Lab Results  Component Value Date   HGBA1C 5.4 11/28/2016   Urine Drug Screen:     Component Value Date/Time   LABOPIA POSITIVE (A) 03/30/2018 1310   COCAINSCRNUR NONE DETECTED 03/30/2018 1310   LABBENZ POSITIVE (A) 03/30/2018 1310   AMPHETMU NONE DETECTED 03/30/2018 1310   THCU NONE DETECTED 03/30/2018 1310   LABBARB NONE DETECTED 03/30/2018 1310    Alcohol Level No results found for: ETH  IMAGING  Ct Head Wo  Contrast  Result Date: 03/30/2018 CLINICAL DATA:  Altered mental status/lethargy.  Vomiting. EXAM: CT HEAD WITHOUT CONTRAST TECHNIQUE: Contiguous axial images were obtained from the base of the skull through the vertex without intravenous contrast. COMPARISON:  Head CT Nov 28, 2016 and brain MRI Nov 29, 2017 FINDINGS: Brain: The ventricles are normal in size and configuration. There is borderline frontal atrophy bilaterally, stable. There is no intracranial mass, hemorrhage, extra-axial fluid collection, or midline shift. Gray-white compartments appear normal. No evident acute infarct. Vascular: No hyperdense vessel evident. There is calcification in the right carotid siphon region. Skull: Bony calvarium appears intact. Sinuses/Orbits: Visualized paranasal sinuses are clear. Visualized orbits appear symmetric bilaterally. Other: Visualized mastoid air cells are clear. IMPRESSION: Slight frontal atrophy. Ventricles normal in size and configuration. Gray-white compartments appear normal. No mass or hemorrhage. There is calcification in the right carotid siphon region. Electronically Signed   By: Bretta Bang III M.D.   On: 03/30/2018 13:09   Ct Angio Chest Pe W And/or Wo Contrast  Result Date: 03/30/2018 CLINICAL DATA:  Lethargy, nausea, vomiting, abdominal distension, diaphoresis, hypoxia, shock, question pulmonary embolism Hip EXAM: CT ANGIOGRAPHY CHEST CT ABDOMEN AND PELVIS  WITH CONTRAST TECHNIQUE: Multidetector CT imaging of the chest was performed using the standard protocol during bolus administration of intravenous contrast. Multiplanar CT image reconstructions and MIPs were obtained to evaluate the vascular anatomy. Multidetector CT imaging of the abdomen and pelvis was performed using the standard protocol during bolus administration of intravenous contrast. CONTRAST:  22mL OMNIPAQUE IOHEXOL 350 MG/ML SOLN IV. No oral contrast. COMPARISON:  CT abdomen and pelvis 02/27/2018 FINDINGS: CTA CHEST FINDINGS  Cardiovascular: Aorta normal caliber without aneurysm or dissection. Pulmonary arteries well opacified and patent. No evidence of pulmonary embolism. No pericardial effusion. Mediastinum/Nodes: Moderate-sized hiatal hernia. Esophagus otherwise unremarkable. Base of cervical region normal appearance. No thoracic adenopathy. Lungs/Pleura: Bibasilar atelectasis. 3 mm subpleural nodule RIGHT middle lobe image 41 unchanged no infiltrate, pleural effusion or pneumothorax. Musculoskeletal: Bones mildly demineralized without acute abnormality Review of the MIP images confirms the above findings. CT ABDOMEN and PELVIS FINDINGS Hepatobiliary: Gallbladder surgically absent. Liver normal appearance Pancreas: Normal appearance Spleen: Normal appearance Adrenals/Urinary Tract: Adrenal glands normal appearance. LEFT renal cysts unchanged. Kidneys, ureters, and bladder otherwise normal appearance Stomach/Bowel: Appendix not localized. Moderate hiatal hernia. Remainder of stomach decompressed. Small bowel loops unremarkable. Mobile cecum with tip located in the LEFT upper quadrant. Decompressed terminal ileum. Increased stool in rectosigmoid colon. Diffuse dilatation of the colon from cecal tip to distal ascending colon. Wall thickening of the colon from sigmoid level to at least the transverse colon consistent with colitis, associated with hyperemia it of the associated mesentery and minimal pericolic infiltration. Proximal 2/3 of the colon is fluid-filled. No definite point of obstruction identified. Vascular/Lymphatic: Scattered atherosclerotic calcifications aorta. No adenopathy. Reproductive: Uterus surgically absent. Small calcifications in the ovaries bilaterally. Other: No free air or free fluid.  No hernia. Musculoskeletal: Prior lumbar fusion L4-S1.  Bones demineralized. Review of the MIP images confirms the above findings. IMPRESSION: Dilated colon fluid-filled proximally and with increased stool in rectosigmoid colon.  Mild colonic wall thickening is identified at the distal half of the colon with hyperemia of the mesocolon and minimal pericolic infiltrative changes consistent with colitis; differential diagnosis would include infection and inflammatory bowel disease, ischemia considered less likely. No evidence of pulmonary embolism or significant intrathoracic process. Moderate-sized hiatal hernia. Electronically Signed   By: Ulyses Southward M.D.   On: 03/30/2018 14:21   Mr Brain Wo Contrast  Result Date: 03/31/2018 CLINICAL DATA:  In cephalopathy. EXAM: MRI HEAD WITHOUT CONTRAST TECHNIQUE: Multiplanar, multiecho pulse sequences of the brain and surrounding structures were obtained without intravenous contrast. COMPARISON:  Head CT 03/30/2018 and MRI 11/29/2016 FINDINGS: Multiple sequences are moderately to severely motion degraded despite repeated imaging attempts an using faster, more motion resistant imaging protocols. Brain: There is a small amount of extra-axial FLAIR hyperintensity over the right parietal convexity suggestive of a trace subdural hematoma measuring up to 3 mm in size. This is not well correlated on the T1 and T2* gradient echo sequences due to motion. There is no associated mass effect. No acute infarct is identified. There is mild cerebral atrophy. Scattered cerebral white matter T2 hyperintensities are similar to the prior MRI and nonspecific but compatible with minimal chronic small vessel ischemic disease. Vascular: Major intracranial vascular flow voids are preserved. Skull and upper cervical spine: No gross marrow lesion. Sinuses/Orbits: Unremarkable orbits. Paranasal sinuses and mastoid air cells are clear. Other: None. IMPRESSION: 1. Severely motion degraded examination. 2. Trace right parietal subdural hematoma without mass effect. 3. No acute infarct. Critical Value/emergent results were called by telephone at  the time of interpretation on 03/31/2018 at 7:05 pm to nurse Lucendia Herrlich, who verbally  acknowledged these results. The ordering physician had been paged by radiology staff as well. Electronically Signed   By: Sebastian Ache M.D.   On: 03/31/2018 19:12   Ct Abdomen Pelvis W Contrast  Result Date: 03/30/2018 CLINICAL DATA:  Lethargy, nausea, vomiting, abdominal distension, diaphoresis, hypoxia, shock, question pulmonary embolism Hip EXAM: CT ANGIOGRAPHY CHEST CT ABDOMEN AND PELVIS WITH CONTRAST TECHNIQUE: Multidetector CT imaging of the chest was performed using the standard protocol during bolus administration of intravenous contrast. Multiplanar CT image reconstructions and MIPs were obtained to evaluate the vascular anatomy. Multidetector CT imaging of the abdomen and pelvis was performed using the standard protocol during bolus administration of intravenous contrast. CONTRAST:  53mL OMNIPAQUE IOHEXOL 350 MG/ML SOLN IV. No oral contrast. COMPARISON:  CT abdomen and pelvis 02/27/2018 FINDINGS: CTA CHEST FINDINGS Cardiovascular: Aorta normal caliber without aneurysm or dissection. Pulmonary arteries well opacified and patent. No evidence of pulmonary embolism. No pericardial effusion. Mediastinum/Nodes: Moderate-sized hiatal hernia. Esophagus otherwise unremarkable. Base of cervical region normal appearance. No thoracic adenopathy. Lungs/Pleura: Bibasilar atelectasis. 3 mm subpleural nodule RIGHT middle lobe image 41 unchanged no infiltrate, pleural effusion or pneumothorax. Musculoskeletal: Bones mildly demineralized without acute abnormality Review of the MIP images confirms the above findings. CT ABDOMEN and PELVIS FINDINGS Hepatobiliary: Gallbladder surgically absent. Liver normal appearance Pancreas: Normal appearance Spleen: Normal appearance Adrenals/Urinary Tract: Adrenal glands normal appearance. LEFT renal cysts unchanged. Kidneys, ureters, and bladder otherwise normal appearance Stomach/Bowel: Appendix not localized. Moderate hiatal hernia. Remainder of stomach decompressed. Small bowel  loops unremarkable. Mobile cecum with tip located in the LEFT upper quadrant. Decompressed terminal ileum. Increased stool in rectosigmoid colon. Diffuse dilatation of the colon from cecal tip to distal ascending colon. Wall thickening of the colon from sigmoid level to at least the transverse colon consistent with colitis, associated with hyperemia it of the associated mesentery and minimal pericolic infiltration. Proximal 2/3 of the colon is fluid-filled. No definite point of obstruction identified. Vascular/Lymphatic: Scattered atherosclerotic calcifications aorta. No adenopathy. Reproductive: Uterus surgically absent. Small calcifications in the ovaries bilaterally. Other: No free air or free fluid.  No hernia. Musculoskeletal: Prior lumbar fusion L4-S1.  Bones demineralized. Review of the MIP images confirms the above findings. IMPRESSION: Dilated colon fluid-filled proximally and with increased stool in rectosigmoid colon. Mild colonic wall thickening is identified at the distal half of the colon with hyperemia of the mesocolon and minimal pericolic infiltrative changes consistent with colitis; differential diagnosis would include infection and inflammatory bowel disease, ischemia considered less likely. No evidence of pulmonary embolism or significant intrathoracic process. Moderate-sized hiatal hernia. Electronically Signed   By: Ulyses Southward M.D.   On: 03/30/2018 14:21   Dg Chest Port 1 View  Result Date: 03/30/2018 CLINICAL DATA:  Altered mental status. EXAM: PORTABLE CHEST 1 VIEW COMPARISON:  Chest x-ray 01/06/2015. FINDINGS: Mediastinum and hilar structures normal. Low lung volumes with mild upper lobe and bibasilar atelectasis. Small bilateral pleural effusions. Sliding hiatal hernia. IMPRESSION: 1.  Sliding hiatal hernia. 2. Low lung volumes with mild right upper lobe and bibasilar atelectasis. Small bilateral pleural effusions. Electronically Signed   By: Maisie Fus  Register   On: 03/30/2018 12:41        PHYSICAL EXAM  Physical exam: Exam: Gen: NAD        CV: RRR, no MRG. No Carotid Bruits. No peripheral edema, warm, nontender Eyes: Conjunctivae clear without exudates or hemorrhage  Neuro:  Detailed Neurologic Exam  Speech:    Patient answers "what" and "hey". No dysarthria noted.  Cognition:    She tracks across the room. She is alert, focuses on examiner. Not following commands or answering questions.  Cranial Nerves:    The pupils are equal, round, and reactive to light. Attempted fundoscopic exam could not visualize due to non-cooperation, blinks to threat bilaterally,  Extraocular movements are intact. Cannot assess Trigeminal sensation The face is symmetric. The palate elevates in the midline. Hearing intact to voice. Voice is normal. Shoulder shrug is normal. The tongue has normal motion without fasciculations.   Coordination:    No dysmetria noted  Gait:    Cannot test due to encephalopathy  Motor Observation:    No asymmetry, no atrophy, and no involuntary movements noted. Tone:    Normal muscle tone.      Strength:    Patient is moving all limbs spontaneously      Sensation: w/d to nox stim x 4     Reflex Exam:  DTR's:    Deep tendon reflexes in the upper and lower extremities are symmetrical bilaterally.   Toes:    The toes are equiv bilaterally.   Clonus:    Clonus is absent.    ASSESSMENT/PLAN 62 year old female PMHx alopecia, anxiety, depression, frequent UTIs, chronic pain on chronic opioids and benzos and by notes tylenol with codeine as well, laxatives  admitted with encephalopathy, vomiting, diarrhea, colitis, sepsis. CT head  was negative, MRI showed possible small SDH but lots of motion artifact, will repeat CT head. Today she is improved with medical management.   - Patient with metabolic encephalopathy. Improved today. No apparent focal neuro deficits, she tracks across the room, focuses on examiner, blinks to threat, moving all  extremities spontaneously.   - MRI and exam not concerning for CNS infection this is likely metabolic encephalopathy from colitis and sepsis. Continue medical management  - May consider EEG tomorrow if does not continue to improve.   - repeat CT head. We will follow up on patient tomorrow. Can page neurologist or call cell phone today or tonight if needed or for acute changes.   Hospital day # 2  Personally examined patient and images, and have participated in and made any corrections needed to history, physical, neuro exam,assessment and plan as stated above.  I have personally obtained the history, evaluated lab date, reviewed imaging studies and agree with radiology interpretations.    Naomie Dean, MD  To contact Stroke Continuity provider, please refer to [email or web address]. After hours, contact General Neurology

## 2018-04-01 NOTE — Progress Notes (Signed)
[facility] Gastroenterology Inpatient Progress Note  Subjective: Patient seen for f/u colitis, sepsis. Patient alert, but still seemingly confused and talks in broken sentences. Not apparently aware of family at bedside. Vomiting x 1 today per RN.  Objective: Vital signs in last 24 hours: Temp:  [98.2 F (36.8 C)-98.8 F (37.1 C)] 98.6 F (37 C) (09/08 0810) Pulse Rate:  [83-131] 83 (09/08 0810) Resp:  [17-20] 18 (09/08 0810) BP: (109-156)/(65-109) 152/73 (09/08 0810) SpO2:  [95 %-100 %] 98 % (09/08 0810) Blood pressure (!) 152/73, pulse 83, temperature 98.6 F (37 C), temperature source Axillary, resp. rate 18, height 5\' 3"  (1.6 m), weight 69.8 kg, SpO2 98 %.    Intake/Output from previous day: 09/07 0701 - 09/08 0700 In: 7066.8 [I.V.:2347.5; IV Piggyback:4719.2] Out: -   Intake/Output this shift: No intake/output data recorded.   General appearance:  Alert, devoid of hair on scalp, able to answer 'yes' or 'no'. Resp:  CTA Cardio:  RRR GI:  Soft, bS+ Extremities:  No edema   Lab Results: Results for orders placed or performed during the hospital encounter of 03/30/18 (from the past 24 hour(s))  Lactic acid, plasma     Status: Abnormal   Collection Time: 03/31/18  3:57 PM  Result Value Ref Range   Lactic Acid, Venous 3.1 (HH) 0.5 - 1.9 mmol/L  Lactic acid, plasma     Status: Abnormal   Collection Time: 03/31/18  8:19 PM  Result Value Ref Range   Lactic Acid, Venous 2.3 (HH) 0.5 - 1.9 mmol/L  Lactic acid, plasma     Status: Abnormal   Collection Time: 03/31/18 11:14 PM  Result Value Ref Range   Lactic Acid, Venous 4.0 (HH) 0.5 - 1.9 mmol/L  Basic metabolic panel     Status: Abnormal   Collection Time: 04/01/18  2:09 AM  Result Value Ref Range   Sodium 144 135 - 145 mmol/L   Potassium 3.9 3.5 - 5.1 mmol/L   Chloride 120 (H) 98 - 111 mmol/L   CO2 16 (L) 22 - 32 mmol/L   Glucose, Bld 111 (H) 70 - 99 mg/dL   BUN 36 (H) 8 - 23 mg/dL   Creatinine, Ser 4.49 (H)  0.44 - 1.00 mg/dL   Calcium 8.0 (L) 8.9 - 10.3 mg/dL   GFR calc non Af Amer 40 (L) >60 mL/min   GFR calc Af Amer 47 (L) >60 mL/min   Anion gap 8 5 - 15  CBC     Status: Abnormal   Collection Time: 04/01/18  2:09 AM  Result Value Ref Range   WBC 16.4 (H) 3.6 - 11.0 K/uL   RBC 4.99 3.80 - 5.20 MIL/uL   Hemoglobin 14.7 12.0 - 16.0 g/dL   HCT 20.1 00.7 - 12.1 %   MCV 90.2 80.0 - 100.0 fL   MCH 29.5 26.0 - 34.0 pg   MCHC 32.8 32.0 - 36.0 g/dL   RDW 97.5 (H) 88.3 - 25.4 %   Platelets 266 150 - 440 K/uL  Lactic acid, plasma     Status: Abnormal   Collection Time: 04/01/18  2:09 AM  Result Value Ref Range   Lactic Acid, Venous 4.3 (HH) 0.5 - 1.9 mmol/L  Lactic acid, plasma     Status: Abnormal   Collection Time: 04/01/18  5:35 AM  Result Value Ref Range   Lactic Acid, Venous 3.6 (HH) 0.5 - 1.9 mmol/L  Lactic acid, plasma     Status: Abnormal   Collection Time: 04/01/18  9:39 AM  Result Value Ref Range   Lactic Acid, Venous 2.8 (HH) 0.5 - 1.9 mmol/L  Lactic acid, plasma     Status: Abnormal   Collection Time: 04/01/18 12:40 PM  Result Value Ref Range   Lactic Acid, Venous 3.0 (HH) 0.5 - 1.9 mmol/L  Hemoglobin     Status: None   Collection Time: 04/01/18  2:37 PM  Result Value Ref Range   Hemoglobin 12.4 12.0 - 16.0 g/dL     Recent Labs    61/44/31 1228 04/01/18 0209 04/01/18 1437  WBC 23.7* 16.4*  --   HGB 17.5* 14.7 12.4  HCT 54.7* 45.0  --   PLT 390 266  --    BMET Recent Labs    03/31/18 0353 03/31/18 1312 04/01/18 0209  NA 141 142 144  K 6.3* 4.3 3.9  CL 116* 123* 120*  CO2 13* 13* 16*  GLUCOSE 133* 96 111*  BUN 41* 40* 36*  CREATININE 1.71* 1.47* 1.38*  CALCIUM 9.0 8.3* 8.0*   LFT Recent Labs    03/30/18 1228  PROT 8.1  ALBUMIN 4.0  AST 65*  ALT 39  ALKPHOS 111  BILITOT 1.6*   PT/INR Recent Labs    03/30/18 1228  LABPROT 13.6  INR 1.05   Hepatitis Panel No results for input(s): HEPBSAG, HCVAB, HEPAIGM, HEPBIGM in the last 72  hours. C-Diff Recent Labs    03/30/18 1923  CDIFFTOX NEGATIVE   No results for input(s): CDIFFPCR in the last 72 hours.   Studies/Results: Ct Head Wo Contrast  Result Date: 04/01/2018 CLINICAL DATA:  Encephalopathy. Vomiting and diarrhea. Sepsis. Abnormal MRI. EXAM: CT HEAD WITHOUT CONTRAST TECHNIQUE: Contiguous axial images were obtained from the base of the skull through the vertex without intravenous contrast. COMPARISON:  MRI brain 03/31/2018.  CT of the head 03/30/2018. FINDINGS: Brain: No acute infarct, hemorrhage, or mass lesion is present. Right parietal extra-axial collection suggested on MRI is not evident by CT. Ventricles are of normal size. No significant white matter disease is present. The brainstem and cerebellum are normal. Vascular: Atherosclerotic changes are present within the cavernous right internal carotid artery. There is no hyperdense vessel. Skull: Calvarium is intact. No focal lytic or blastic lesions are present. No significant extracranial soft tissue abnormalities are present. Sinuses/Orbits: The paranasal sinuses and mastoid air cells are clear. Globes and orbits are within normal limits. IMPRESSION: 1. Normal CT appearance of the head for age. No significant extra-axial fluid collection is visualized. Electronically Signed   By: Marin Roberts M.D.   On: 04/01/2018 13:56   Mr Brain Wo Contrast  Result Date: 03/31/2018 CLINICAL DATA:  In cephalopathy. EXAM: MRI HEAD WITHOUT CONTRAST TECHNIQUE: Multiplanar, multiecho pulse sequences of the brain and surrounding structures were obtained without intravenous contrast. COMPARISON:  Head CT 03/30/2018 and MRI 11/29/2016 FINDINGS: Multiple sequences are moderately to severely motion degraded despite repeated imaging attempts an using faster, more motion resistant imaging protocols. Brain: There is a small amount of extra-axial FLAIR hyperintensity over the right parietal convexity suggestive of a trace subdural hematoma  measuring up to 3 mm in size. This is not well correlated on the T1 and T2* gradient echo sequences due to motion. There is no associated mass effect. No acute infarct is identified. There is mild cerebral atrophy. Scattered cerebral white matter T2 hyperintensities are similar to the prior MRI and nonspecific but compatible with minimal chronic small vessel ischemic disease. Vascular: Major intracranial vascular flow voids are preserved. Skull and  upper cervical spine: No gross marrow lesion. Sinuses/Orbits: Unremarkable orbits. Paranasal sinuses and mastoid air cells are clear. Other: None. IMPRESSION: 1. Severely motion degraded examination. 2. Trace right parietal subdural hematoma without mass effect. 3. No acute infarct. Critical Value/emergent results were called by telephone at the time of interpretation on 03/31/2018 at 7:05 pm to nurse Lucendia Herrlich, who verbally acknowledged these results. The ordering physician had been paged by radiology staff as well. Electronically Signed   By: Sebastian Ache M.D.   On: 03/31/2018 19:12    Scheduled Inpatient Medications:   . atorvastatin  40 mg Oral q1800  . senna  1 tablet Oral BID    Continuous Inpatient Infusions:   . sodium chloride 150 mL/hr at 04/01/18 1024  . ciprofloxacin 400 mg (04/01/18 0955)  . famotidine (PEPCID) IV    . metronidazole 500 mg (04/01/18 1029)    PRN Inpatient Medications:  acetaminophen **OR** acetaminophen, ondansetron **OR** ondansetron (ZOFRAN) IV, polyethylene glycol  Miscellaneous: N/A  Assessment: 1. Diarrhea, acute - C dif negative, but needs other potential pathogens ruled out. C dif panel pending.  2. Altered mental status - Appreciate Neuro consult, Psych consult. More alert, still confused. Unclear etiology, possibly related to sepsis.  3. Abnormal CT of the abdomen/pelvis - Mobile cecum with tip in the LUQ quadrant. Proximal 2/3 of colon was fluid filled.  NO obvious  obstruction.   Recommendations: 1. Check GI panel. 2. Continue management as per Neuro, medicine. 3. Will follow along.   Shantale Holtmeyer K. Norma Fredrickson, M.D. 04/01/2018, 3:19 PM

## 2018-04-01 NOTE — Progress Notes (Signed)
Pt with a lactic acid of 4.0 Dr. Anne Hahn notified and place new orders.

## 2018-04-01 NOTE — Progress Notes (Signed)
Telemetry consult carts are not working this shift. SCO machine not able to connect online. Notified Dr. Anne Hahn that we are not able to do Neurology consult this shift.

## 2018-04-01 NOTE — Progress Notes (Signed)
Pt responds to voice. Will open eyes for a second and then close them. Lactic acid continues to trend up, Md on call notified. Received Bolus for elevated Heart rate and lactic acid level. Incontinent of stool and urine.

## 2018-04-01 NOTE — Progress Notes (Signed)
[employer] [facility] [place] at [facility]   PATIENT NAME: Donna Marsh    MR#:  [id number]  DATE OF BIRTH:  04-11-1956  SUBJECTIVE:  CHIEF COMPLAINT:   Chief Complaint  Patient presents with  . Altered Mental Status   The patient is confused, she opened her eyes on verbal stimuli but does not follow commands.  Tachycardia improved.  She had one episode of coffee-ground vomiting per RN this morning. REVIEW OF SYSTEMS:  Review of Systems  Unable to perform ROS: Mental status change    DRUG ALLERGIES:  No Known Allergies VITALS:  Blood pressure (!) 152/73, pulse 83, temperature 98.6 F (37 C), temperature source Axillary, resp. rate 18, height 5\' 3"  (1.6 m), weight 69.8 kg, SpO2 98 %. PHYSICAL EXAMINATION:  Physical Exam  Constitutional: She appears well-developed.  HENT:  Head: Normocephalic.  Eyes: Pupils are equal, round, and reactive to light. Conjunctivae and EOM are normal. No scleral icterus.  Neck: Neck supple. No JVD present. No tracheal deviation present.  Cardiovascular: Normal rate, regular rhythm and normal heart sounds. Exam reveals no gallop.  No murmur heard. Pulmonary/Chest: Effort normal and breath sounds normal. No respiratory distress. She has no wheezes. She has no rales.  Abdominal: Soft. Bowel sounds are normal. She exhibits no distension. There is no tenderness. There is no rebound.  Musculoskeletal: She exhibits no edema or tenderness.  Neurological:  The patient is responsive to verbal stimuli and opened eyes.  She does not follow commands.  Skin: No rash noted. No erythema.   LABORATORY PANEL:  Female CBC Recent Labs  Lab 04/01/18 0209  WBC 16.4*  HGB 14.7  HCT 45.0  PLT 266   ------------------------------------------------------------------------------------------------------------------ Chemistries  Recent Labs  Lab 03/30/18 1228  04/01/18 0209  NA 135   < > 144  K 5.4*   < > 3.9  CL 106   < > 120*  CO2 17*   < >  16*  GLUCOSE 197*   < > 111*  BUN 33*   < > 36*  CREATININE 1.59*   < > 1.38*  CALCIUM 10.8*   < > 8.0*  AST 65*  --   --   ALT 39  --   --   ALKPHOS 111  --   --   BILITOT 1.6*  --   --    < > = values in this interval not displayed.   RADIOLOGY:  Mr Brain Wo Contrast  Result Date: 03/31/2018 CLINICAL DATA:  In cephalopathy. EXAM: MRI HEAD WITHOUT CONTRAST TECHNIQUE: Multiplanar, multiecho pulse sequences of the brain and surrounding structures were obtained without intravenous contrast. COMPARISON:  Head CT 03/30/2018 and MRI 11/29/2016 FINDINGS: Multiple sequences are moderately to severely motion degraded despite repeated imaging attempts an using faster, more motion resistant imaging protocols. Brain: There is a small amount of extra-axial FLAIR hyperintensity over the right parietal convexity suggestive of a trace subdural hematoma measuring up to 3 mm in size. This is not well correlated on the T1 and T2* gradient echo sequences due to motion. There is no associated mass effect. No acute infarct is identified. There is mild cerebral atrophy. Scattered cerebral white matter T2 hyperintensities are similar to the prior MRI and nonspecific but compatible with minimal chronic small vessel ischemic disease. Vascular: Major intracranial vascular flow voids are preserved. Skull and upper cervical spine: No gross marrow lesion. Sinuses/Orbits: Unremarkable orbits. Paranasal sinuses and mastoid air cells are clear. Other: None. IMPRESSION: 1. Severely motion  degraded examination. 2. Trace right parietal subdural hematoma without mass effect. 3. No acute infarct. Critical Value/emergent results were called by telephone at the time of interpretation on 03/31/2018 at 7:05 pm to nurse Lucendia Herrlich, who verbally acknowledged these results. The ordering physician had been paged by radiology staff as well. Electronically Signed   By: Sebastian Ache M.D.   On: 03/31/2018 19:12   ASSESSMENT AND PLAN:   Donna Marsh  is a 62 y.o. female with a known history of chronic back pain gets narcotics from her primary care physician, h/o Alopecia, history of recurrent UTI, depression comes to the emergency room brought in by EMS after patient was found to be very lethargic diaphoretic and collapsed in the bathroom at home. She did episode of vomiting. According to the husband she is not been eating well for last couple days.  1. sepsis suspected due to acute colitis -presented with altered mental status, diaphoresis and very weak -CT abdomen suggestive of colitis. No pneumonia noted on CT chest. She was on IV Unasyn, changed to Cipro and Flagyl. N.p.o. with IV fluid support.  Pain control.  Follow-up with CBC and blood culture.  2. Lactic acidosis due to above.  Follow-up lactic acid level.  Acute metabolic encephalopathy due to above.  Aspiration fall precaution. Continue treatment as above. Dr. Norma Fredrickson had concerns about CNS infection. MRI of the brain showed trace parietal subdural hematoma.  But for Dr. Lucia Gaskins, neurologist, it is possible due to motion.  No evidence of CNS infection.  Repeat CT head today. Acyclovir, Rocephin and vancomycin were discontinued.  3. h/o constipation suspected narcotic induced which patient takes for back pain -stool softener  4. Anxiety depression -resumed home meds when patient is awake  5. Acute renal failure due to dehydration.   Continue IV hydration. Baseline creatinine is 1.1 Renal function is improving.  Hyperkalemia. Given calcium gluconate, D50, NovoLog and Kayexalate.  Improved.  Coffee-ground vomiting. Keep n.p.o., start IV famotidine bid, follow-up hemoglobin.  Discussed with Dr. Lucia Gaskins and Dr. Norma Fredrickson. All the records are reviewed and case discussed with Care Management/Social Worker. Management plans discussed with the patient's husband and daughter and they are in agreement.  CODE STATUS: Full Code  TOTAL TIME TAKING CARE OF THIS PATIENT: 45  minutes.   More than 50% of the time was spent in counseling/coordination of care: YES  POSSIBLE D/C IN 3 DAYS, DEPENDING ON CLINICAL CONDITION.   Shaune Pollack M.D on 04/01/2018 at 1:35 PM  Between 7am to 6pm - Pager - [phone]  After 6pm go to [email or web address] - Therapist, nutritional Hospitalists

## 2018-04-02 ENCOUNTER — Encounter: Payer: Self-pay | Admitting: *Deleted

## 2018-04-02 DIAGNOSIS — K529 Noninfective gastroenteritis and colitis, unspecified: Secondary | ICD-10-CM

## 2018-04-02 DIAGNOSIS — G9341 Metabolic encephalopathy: Secondary | ICD-10-CM

## 2018-04-02 HISTORY — DX: Noninfective gastroenteritis and colitis, unspecified: K52.9

## 2018-04-02 LAB — BASIC METABOLIC PANEL
ANION GAP: 6 (ref 5–15)
BUN: 24 mg/dL — AB (ref 8–23)
CO2: 18 mmol/L — AB (ref 22–32)
Calcium: 8 mg/dL — ABNORMAL LOW (ref 8.9–10.3)
Chloride: 122 mmol/L — ABNORMAL HIGH (ref 98–111)
Creatinine, Ser: 1.07 mg/dL — ABNORMAL HIGH (ref 0.44–1.00)
GFR calc Af Amer: >60 mL/min (ref >60)
GFR, EST NON AFRICAN AMERICAN: 55 mL/min — AB (ref >60)
GLUCOSE: 76 mg/dL (ref 70–99)
POTASSIUM: 3.2 mmol/L — AB (ref 3.5–5.1)
Sodium: 146 mmol/L — ABNORMAL HIGH (ref 135–145)

## 2018-04-02 LAB — CBC
HCT: 35 % (ref 35.0–47.0)
HEMOGLOBIN: 11.6 g/dL — AB (ref 12.0–16.0)
MCH: 29.3 pg (ref 26.0–34.0)
MCHC: 33.1 g/dL (ref 32.0–36.0)
MCV: 88.5 fL (ref 80.0–100.0)
Platelets: 238 10*3/uL (ref 150–440)
RBC: 3.95 MIL/uL (ref 3.80–5.20)
RDW: 17.5 % — ABNORMAL HIGH (ref 11.5–14.5)
WBC: 14.2 10*3/uL — ABNORMAL HIGH (ref 3.6–11.0)

## 2018-04-02 LAB — LACTIC ACID, PLASMA: Lactic Acid, Venous: 2.1 mmol/L (ref 0.5–1.9)

## 2018-04-02 LAB — MAGNESIUM: Magnesium: 1.8 mg/dL (ref 1.7–2.4)

## 2018-04-02 MED ORDER — POTASSIUM CHLORIDE CRYS ER 20 MEQ PO TBCR
40.0000 meq | EXTENDED_RELEASE_TABLET | Freq: Once | ORAL | Status: AC
  Administered 2018-04-02: 40 meq via ORAL
  Filled 2018-04-02: qty 2

## 2018-04-02 MED ORDER — ALPRAZOLAM 1 MG PO TABS
1.0000 mg | ORAL_TABLET | Freq: Three times a day (TID) | ORAL | Status: DC | PRN
  Administered 2018-04-02 – 2018-04-04 (×2): 1 mg via ORAL
  Filled 2018-04-02 (×2): qty 1

## 2018-04-02 MED ORDER — CIPROFLOXACIN HCL 500 MG PO TABS
500.0000 mg | ORAL_TABLET | Freq: Two times a day (BID) | ORAL | Status: DC
  Administered 2018-04-02 – 2018-04-05 (×6): 500 mg via ORAL
  Filled 2018-04-02 (×7): qty 1

## 2018-04-02 MED ORDER — ESCITALOPRAM OXALATE 10 MG PO TABS
20.0000 mg | ORAL_TABLET | Freq: Every day | ORAL | Status: DC
  Administered 2018-04-02 – 2018-04-05 (×4): 20 mg via ORAL
  Filled 2018-04-02 (×5): qty 2

## 2018-04-02 MED ORDER — METRONIDAZOLE 500 MG PO TABS
500.0000 mg | ORAL_TABLET | Freq: Three times a day (TID) | ORAL | Status: DC
  Administered 2018-04-02 – 2018-04-04 (×8): 500 mg via ORAL
  Filled 2018-04-02 (×9): qty 1

## 2018-04-02 MED ORDER — PANTOPRAZOLE SODIUM 40 MG PO TBEC
40.0000 mg | DELAYED_RELEASE_TABLET | Freq: Two times a day (BID) | ORAL | Status: DC
  Administered 2018-04-02 – 2018-04-04 (×4): 40 mg via ORAL
  Filled 2018-04-02 (×4): qty 1

## 2018-04-02 NOTE — Progress Notes (Signed)
Subjective: Patient is more awake and alert today. She is able to hold conversation without difficulty. Husband is at bedside, he report that patient's mental status continues to improve. He thinks that patient is about 70% back to her baseline. Per husband she still has some intermittent confusion which patient denies.  Objective: Current vital signs: BP (!) 145/81 (BP Location: Right Arm)   Pulse 75   Temp 97.8 F (36.6 C) (Oral)   Resp 18   Ht 5\' 3"  (1.6 m)   Wt 69.8 kg   SpO2 99%   BMI 27.26 kg/m  Vital signs in last 24 hours: Temp:  [97.8 F (36.6 C)-98.6 F (37 C)] 97.8 F (36.6 C) (09/09 0813) Pulse Rate:  [72-103] 75 (09/09 0813) Resp:  [16-18] 18 (09/09 0813) BP: (144-147)/(65-81) 145/81 (09/09 0813) SpO2:  [97 %-99 %] 99 % (09/09 0813)  Intake/Output from previous day: No intake/output data recorded. Intake/Output this shift: Total I/O In: 1098.9 [IV Piggyback:1098.9] Out: -  Nutritional status:  Diet Order            Diet clear liquid Room service appropriate? Yes; Fluid consistency: Thin  Diet effective now              Physical Exam   Vitals Blood pressure (!) 145/81, pulse 75, temperature 97.8 F (36.6 C), temperature source Oral, resp. rate 18, height 5\' 3"  (1.6 m), weight 69.8 kg, SpO2 99 %.   Neurological Exam . Alert,  . Oriented to time, place, person  . Attention span and concentration seemed appropriate  . Language seemed intact (naming, spontaneous speech, comprehension)  . I. Olfactory not examined . II: Visual fields were full. Pupils were equal, round and reactive to light and accommodation . III,IV, VI: ptosis not present, extra-ocular motions intact bilaterally . V,VII: smile symmetric, facial light touch sensation normal bilaterally . VIII: Finger rub was heard symmetric in both ears . IX, X: Palate and uvular movements are normal and oral sensations are OK, gag reflex deffered . XI: Neck muscle strength and shoulder shrug is  normal . XII: midline tongue extension . Tone is normal in all extremities, no abnormal movements seen  . Muscle strength in all extremities seemed normal.  . Deep tendon reflexes were symmetric  . Sensations were intact to light touch in all extremities  . Gait and station differed, patient in bed  Lab Results: Basic Metabolic Panel: Recent Labs  Lab 03/30/18 1228 03/31/18 0353 03/31/18 1312 04/01/18 0209 04/02/18 0346  NA 135 141 142 144 146*  K 5.4* 6.3* 4.3 3.9 3.2*  CL 106 116* 123* 120* 122*  CO2 17* 13* 13* 16* 18*  GLUCOSE 197* 133* 96 111* 76  BUN 33* 41* 40* 36* 24*  CREATININE 1.59* 1.71* 1.47* 1.38* 1.07*  CALCIUM 10.8* 9.0 8.3* 8.0* 8.0*  MG  --   --   --   --  1.8    Liver Function Tests: Recent Labs  Lab 03/30/18 1228  AST 65*  ALT 39  ALKPHOS 111  BILITOT 1.6*  PROT 8.1  ALBUMIN 4.0   Recent Labs  Lab 03/30/18 1228  LIPASE 25   No results for input(s): AMMONIA in the last 168 hours.  CBC: Recent Labs  Lab 03/30/18 1228 04/01/18 0209 04/01/18 1437 04/02/18 0346  WBC 23.7* 16.4*  --  14.2*  NEUTROABS 21.3*  --   --   --   HGB 17.5* 14.7 12.4 11.6*  HCT 54.7* 45.0  --  35.0  MCV 91.4 90.2  --  88.5  PLT 390 266  --  238    Cardiac Enzymes: Recent Labs  Lab 03/30/18 1228  TROPONINI 0.04*    Lipid Panel: No results for input(s): CHOL, TRIG, HDL, CHOLHDL, VLDL, LDLCALC in the last 168 hours.  CBG: Recent Labs  Lab 03/31/18 0824  GLUCAP 112*    Microbiology: Results for orders placed or performed during the hospital encounter of 03/30/18  Blood Culture (routine x 2)     Status: None (Preliminary result)   Collection Time: 03/30/18 12:28 PM  Result Value Ref Range Status   Specimen Description BLOOD LEFT ANTECUBITAL  Final   Special Requests   Final    BOTTLES DRAWN AEROBIC AND ANAEROBIC Blood Culture adequate volume   Culture   Final    NO GROWTH 3 DAYS Performed at [facility], [address]  [place], [place] [address]    Report Status PENDING  Incomplete  Blood Culture (routine x 2)     Status: None (Preliminary result)   Collection Time: 03/30/18 12:28 PM  Result Value Ref Range Status   Specimen Description BLOOD BLOOD LEFT HAND  Final   Special Requests   Final    BOTTLES DRAWN AEROBIC AND ANAEROBIC Blood Culture results may not be optimal due to an inadequate volume of blood received in culture bottles   Culture   Final    NO GROWTH 3 DAYS Performed at [facility], [address], [place], [place] [address]    Report Status PENDING  Incomplete  Urine culture     Status: None   Collection Time: 03/30/18  1:10 PM  Result Value Ref Range Status   Specimen Description   Final    URINE, CATHETERIZED Performed at [facility], [address], [place], [place] [address]    Special Requests   Final    NONE Performed at [facility], [address] [place], [place] [address]    Culture   Final    NO GROWTH Performed at [facility], [address] [place], [place] [address]    Report Status 03/31/2018 FINAL  Final  Gastrointestinal Panel by PCR , Stool     Status: None   Collection Time: 03/30/18  6:00 PM  Result Value Ref Range Status   Campylobacter species NOT DETECTED NOT DETECTED Final   Plesimonas shigelloides NOT DETECTED NOT DETECTED Final   Salmonella species NOT DETECTED NOT DETECTED Final   Yersinia enterocolitica NOT DETECTED NOT DETECTED Final   Vibrio species NOT DETECTED NOT DETECTED Final   Vibrio cholerae NOT DETECTED NOT DETECTED Final   Enteroaggregative E coli (EAEC) NOT DETECTED NOT DETECTED Final   Enteropathogenic E coli (EPEC) NOT DETECTED NOT DETECTED Final   Enterotoxigenic E coli (ETEC) NOT DETECTED NOT DETECTED Final   Shiga like toxin producing E coli (STEC) NOT DETECTED NOT DETECTED Final   Shigella/Enteroinvasive E coli (EIEC) NOT DETECTED NOT DETECTED Final   Cryptosporidium NOT DETECTED NOT DETECTED  Final   Cyclospora cayetanensis NOT DETECTED NOT DETECTED Final   Entamoeba histolytica NOT DETECTED NOT DETECTED Final   Giardia lamblia NOT DETECTED NOT DETECTED Final   Adenovirus F40/41 NOT DETECTED NOT DETECTED Final   Astrovirus NOT DETECTED NOT DETECTED Final   Norovirus GI/GII NOT DETECTED NOT DETECTED Final   Rotavirus A NOT DETECTED NOT DETECTED Final   Sapovirus (I, II, IV, and V) NOT DETECTED NOT DETECTED Final    Comment:  Performed at [facility], [address] [place], [place] [address]  C difficile quick scan w PCR reflex     Status: None   Collection Time: 03/30/18  7:23 PM  Result Value Ref Range Status   C Diff antigen NEGATIVE NEGATIVE Final   C Diff toxin NEGATIVE NEGATIVE Final   C Diff interpretation No C. difficile detected.  Final    Comment: Performed at [facility], [address] [place], [place] [address]    Coagulation Studies: No results for input(s): LABPROT, INR in the last 72 hours.  Imaging: Ct Head Wo Contrast  Result Date: 04/01/2018 CLINICAL DATA:  Encephalopathy. Vomiting and diarrhea. Sepsis. Abnormal MRI. EXAM: CT HEAD WITHOUT CONTRAST TECHNIQUE: Contiguous axial images were obtained from the base of the skull through the vertex without intravenous contrast. COMPARISON:  MRI brain 03/31/2018.  CT of the head 03/30/2018. FINDINGS: Brain: No acute infarct, hemorrhage, or mass lesion is present. Right parietal extra-axial collection suggested on MRI is not evident by CT. Ventricles are of normal size. No significant white matter disease is present. The brainstem and cerebellum are normal. Vascular: Atherosclerotic changes are present within the cavernous right internal carotid artery. There is no hyperdense vessel. Skull: Calvarium is intact. No focal lytic or blastic lesions are present. No significant extracranial soft tissue abnormalities are present. Sinuses/Orbits: The paranasal sinuses and mastoid air cells are clear. Globes  and orbits are within normal limits. IMPRESSION: 1. Normal CT appearance of the head for age. No significant extra-axial fluid collection is visualized. Electronically Signed   By: Marin Roberts M.D.   On: 04/01/2018 13:56   Mr Brain Wo Contrast  Result Date: 03/31/2018 CLINICAL DATA:  In cephalopathy. EXAM: MRI HEAD WITHOUT CONTRAST TECHNIQUE: Multiplanar, multiecho pulse sequences of the brain and surrounding structures were obtained without intravenous contrast. COMPARISON:  Head CT 03/30/2018 and MRI 11/29/2016 FINDINGS: Multiple sequences are moderately to severely motion degraded despite repeated imaging attempts an using faster, more motion resistant imaging protocols. Brain: There is a small amount of extra-axial FLAIR hyperintensity over the right parietal convexity suggestive of a trace subdural hematoma measuring up to 3 mm in size. This is not well correlated on the T1 and T2* gradient echo sequences due to motion. There is no associated mass effect. No acute infarct is identified. There is mild cerebral atrophy. Scattered cerebral white matter T2 hyperintensities are similar to the prior MRI and nonspecific but compatible with minimal chronic small vessel ischemic disease. Vascular: Major intracranial vascular flow voids are preserved. Skull and upper cervical spine: No gross marrow lesion. Sinuses/Orbits: Unremarkable orbits. Paranasal sinuses and mastoid air cells are clear. Other: None. IMPRESSION: 1. Severely motion degraded examination. 2. Trace right parietal subdural hematoma without mass effect. 3. No acute infarct. Critical Value/emergent results were called by telephone at the time of interpretation on 03/31/2018 at 7:05 pm to nurse Lucendia Herrlich, who verbally acknowledged these results. The ordering physician had been paged by radiology staff as well. Electronically Signed   By: Sebastian Ache M.D.   On: 03/31/2018 19:12    Medications:  I have reviewed the patient's current  medications. Prior to Admission:  Medications Prior to Admission  Medication Sig Dispense Refill Last Dose  . acetaminophen-codeine (TYLENOL #3) 300-30 MG tablet Take 1 tablet by mouth every 6 (six) hours as needed for moderate pain or severe pain. Take 1 tablet every 6 to 8 hours as needed for pain.   prn at prn  .  ALPRAZolam (XANAX) 1 MG tablet Take 1 mg by mouth 3 (three) times daily.    03/30/2018 at Unknown time  . aspirin EC 81 MG tablet Take 1 tablet (81 mg total) by mouth daily. 30 tablet 2 Past Week at Unknown time  . baclofen (LIORESAL) 20 MG tablet Take 1 tablet (20 mg total) by mouth 2 (two) times daily as needed for muscle spasms. (Patient taking differently: Take 20 mg by mouth 3 (three) times daily. ) 30 each 0 03/29/2018 at Unknown time  . escitalopram (LEXAPRO) 20 MG tablet Take 20 mg by mouth daily.    03/30/2018 at Unknown time  . solifenacin (VESICARE) 5 MG tablet Take 5 mg by mouth daily.  11 03/30/2018 at Unknown time  . spironolactone (ALDACTONE) 50 MG tablet Take 150-200 mg by mouth daily.    03/30/2018 at Unknown time  . traMADol (ULTRAM) 50 MG tablet Take 1 tablet (50 mg total) by mouth every 6 (six) hours as needed for moderate pain (headache). (Patient not taking: Reported on 03/30/2018) 12 tablet 0 Completed Course at Unknown time   Scheduled: . atorvastatin  40 mg Oral q1800  . senna  1 tablet Oral BID    Patient seen and examined.  Clinical course and management discussed.  Necessary edits performed.  I agree with the above.  Assessment and plan of care developed and discussed below.     Assessment: 62 year old female encephalopathy likely metabolic in the setting of acute colitis and sepsis now improved with no apparent focal neurological deficit. She alert and oriented x 4 and able to converse without difficulty. Repeat CT head personally reviewed which did not show any acute abnormality or previously noted right parietal subdural hematoma. Will not pursue further work up  for encephalopathy due to improving mental status with medical management.  Plan 1. Agree with current medical management and treatment of underlying conditions 2. No further neurologic intervention is recommended at this time.  If further questions arise, please call or page at that time.  Thank you for allowing neurology to participate in the care of this patient.   This patient was staffed with Dr. Verlon Au, Thad Ranger who personally evaluated patient, reviewed documentation and agreed with assessment and plan of care as above.  Webb Silversmith, DNP, FNP-BC Board certified Nurse Practitioner Neurology Department   04/02/2018  1:31 PM   Thana Farr, MD Neurology [phone]  04/02/2018  2:21 PM

## 2018-04-02 NOTE — Progress Notes (Signed)
[employer] - [place] at [facility]   PATIENT NAME: Donna Marsh    MR#:  [id number]  DATE OF BIRTH:  31-Jan-1956  SUBJECTIVE:  CHIEF COMPLAINT:   Chief Complaint  Patient presents with  . Altered Mental Status   The patient is alert, awake and oriented.  She complains of diarrhea twice today and generalized weakness. REVIEW OF SYSTEMS:  Review of Systems  Constitutional: Positive for malaise/fatigue. Negative for chills and fever.  HENT: Negative for sore throat.   Eyes: Negative for blurred vision and double vision.  Respiratory: Negative for cough, hemoptysis, shortness of breath, wheezing and stridor.   Cardiovascular: Negative for chest pain, palpitations, orthopnea and leg swelling.  Gastrointestinal: Positive for diarrhea. Negative for abdominal pain, blood in stool, melena, nausea and vomiting.  Genitourinary: Negative for dysuria, flank pain and hematuria.  Musculoskeletal: Negative for back pain and joint pain.  Skin: Negative for rash.  Neurological: Negative for dizziness, sensory change, focal weakness, seizures, loss of consciousness, weakness and headaches.  Endo/Heme/Allergies: Negative for polydipsia.  Psychiatric/Behavioral: Negative for depression. The patient is not nervous/anxious.     DRUG ALLERGIES:  No Known Allergies VITALS:  Blood pressure (!) 145/81, pulse 75, temperature 97.8 F (36.6 C), temperature source Oral, resp. rate 18, height 5\' 3"  (1.6 m), weight 69.8 kg, SpO2 99 %. PHYSICAL EXAMINATION:  Physical Exam  Constitutional: She is oriented to person, place, and time. She appears well-developed. No distress.  HENT:  Head: Normocephalic.  Eyes: Pupils are equal, round, and reactive to light. Conjunctivae and EOM are normal. No scleral icterus.  Neck: Neck supple. No JVD present. No tracheal deviation present.  Cardiovascular: Normal rate, regular rhythm and normal heart sounds. Exam reveals no gallop.  No murmur  heard. Pulmonary/Chest: Effort normal and breath sounds normal. No respiratory distress. She has no wheezes. She has no rales.  Abdominal: Soft. Bowel sounds are normal. She exhibits no distension. There is no tenderness. There is no rebound.  Musculoskeletal: She exhibits no edema or tenderness.  Neurological: She is alert and oriented to person, place, and time. No cranial nerve deficit.  Skin: No rash noted. No erythema.  Psychiatric: She has a normal mood and affect.   LABORATORY PANEL:  Female CBC Recent Labs  Lab 04/02/18 0346  WBC 14.2*  HGB 11.6*  HCT 35.0  PLT 238   ------------------------------------------------------------------------------------------------------------------ Chemistries  Recent Labs  Lab 03/30/18 1228  04/02/18 0346  NA 135   < > 146*  K 5.4*   < > 3.2*  CL 106   < > 122*  CO2 17*   < > 18*  GLUCOSE 197*   < > 76  BUN 33*   < > 24*  CREATININE 1.59*   < > 1.07*  CALCIUM 10.8*   < > 8.0*  MG  --   --  1.8  AST 65*  --   --   ALT 39  --   --   ALKPHOS 111  --   --   BILITOT 1.6*  --   --    < > = values in this interval not displayed.   RADIOLOGY:  No results found. ASSESSMENT AND PLAN:   Donna Marsh  is a 62 y.o. female with a known history of chronic back pain gets narcotics from her primary care physician, h/o Alopecia, history of recurrent UTI, depression comes to the emergency room brought in by EMS after patient was found to be very lethargic diaphoretic  and collapsed in the bathroom at home. She did episode of vomiting. According to the husband she is not been eating well for last couple days.  1. sepsis due to acute colitis -presented with altered mental status, diaphoresis and very weak -CT abdomen suggestive of colitis. No pneumonia noted on CT chest. She was on IV Unasyn, changed to po Cipro and Flagyl.  Leukocytosis is improving. Started liquid diet. Pain control.  Follow-up with CBC and blood culture.  2. Lactic  acidosis due to above.  Improved.  Acute metabolic encephalopathy due to above.  Aspiration fall precaution. Continue treatment as above. Dr. Norma Fredrickson had concerns about CNS infection. MRI of the brain showed trace parietal subdural hematoma.  But for Dr. Lucia Gaskins, neurologist, it is possible due to motion.  No evidence of CNS infection.  Repeat CT head is unremarkable. Acyclovir, Rocephin and vancomycin were discontinued.  3. h/o constipation suspected narcotic induced which patient takes for back pain Hold stool softener.  4. Anxiety depression -resumed Xanax as needed.  5. Acute renal failure due to dehydration.   Improved with IV fluid support.  Hyperkalemia. Given calcium gluconate, D50, NovoLog and Kayexalate.  Improved.  Hypokalemia.  Give potassium supplement and follow-up level.  Magnesium is normal.  Coffee-ground vomiting. No more coffee-ground vomiting.  Hemoglobin is stable.  Change to p.o. Protonix.  Generalized weakness.  PT evaluation. Discussed with Dr. Thad Ranger. All the records are reviewed and case discussed with Care Management/Social Worker. Management plans discussed with the patient's husband and daughter and they are in agreement.  CODE STATUS: Full Code  TOTAL TIME TAKING CARE OF THIS PATIENT: 35 minutes.   More than 50% of the time was spent in counseling/coordination of care: YES  POSSIBLE D/C IN 2 DAYS, DEPENDING ON CLINICAL CONDITION.   Shaune Pollack M.D on 04/02/2018 at 4:18 PM  Between 7am to 6pm - Pager - [phone]  After 6pm go to [email or web address] - Therapist, nutritional Hospitalists

## 2018-04-02 NOTE — Progress Notes (Signed)
GI Inpatient Follow-up Note  HPI: Donna Marsh is a 62 y.o. female seen for follow-up of colitis and sepsis. Pt has husband and other family at bedside. Pt is alert, and much more with it today compared to over the weekend. No acute events overnight. She reports one episode of diarrhea this morning. She denies bloody or dark stools. She denies fever, nausea, vomiting, dysphagia, abdominal pain. She has been tolerating her clear liquid diet without difficulty. She reports last colonoscopy was probably over 10 years ago done in [place]. No family hx of colon cancer or polyps.    Scheduled Inpatient Medications:  . atorvastatin  40 mg Oral q1800  . senna  1 tablet Oral BID    Continuous Inpatient Infusions:   . sodium chloride 150 mL/hr at 04/02/18 0452  . ciprofloxacin Stopped (04/02/18 1158)  . famotidine (PEPCID) IV Stopped (04/01/18 2229)  . metronidazole Stopped (04/02/18 1021)    PRN Inpatient Medications:  acetaminophen **OR** acetaminophen, ondansetron **OR** ondansetron (ZOFRAN) IV, polyethylene glycol  Review of Systems: Constitutional: Weight is stable.  Eyes: No changes in vision. ENT: No oral lesions, sore throat.  GI: see HPI.  Heme/Lymph: No easy bruising.  CV: No chest pain.  GU: No hematuria.  Integumentary: No rashes.  Neuro: No headaches.  Psych: No depression/anxiety.  Endocrine: No heat/cold intolerance.  Allergic/Immunologic: No urticaria.  Resp: No cough, SOB.  Musculoskeletal: No joint swelling.    Physical Examination: BP (!) 145/81 (BP Location: Right Arm)   Pulse 75   Temp 97.8 F (36.6 C) (Oral)   Resp 18   Ht 5\' 3"  (1.6 m)   Wt 69.8 kg   SpO2 99%   BMI 27.26 kg/m  Gen: NAD, alert and oriented to person, place, and time HEENT: PEERLA, EOMI, Neck: supple, no JVD or thyromegaly Chest: CTA bilaterally, no wheezes, crackles, or other adventitious sounds CV: RRR, no m/g/c/r Abd: soft, NT, ND, +BS in all four quadrants; no HSM,  guarding, ridigity, or rebound tenderness Ext: no edema, well perfused with 2+ pulses, Skin: no rash or lesions noted Lymph: no LAD  Data: Lab Results  Component Value Date   WBC 14.2 (H) 04/02/2018   HGB 11.6 (L) 04/02/2018   HCT 35.0 04/02/2018   MCV 88.5 04/02/2018   PLT 238 04/02/2018   Recent Labs  Lab 04/01/18 0209 04/01/18 1437 04/02/18 0346  HGB 14.7 12.4 11.6*   Lab Results  Component Value Date   NA 146 (H) 04/02/2018   K 3.2 (L) 04/02/2018   CL 122 (H) 04/02/2018   CO2 18 (L) 04/02/2018   BUN 24 (H) 04/02/2018   CREATININE 1.07 (H) 04/02/2018   Lab Results  Component Value Date   ALT 39 03/30/2018   AST 65 (H) 03/30/2018   ALKPHOS 111 03/30/2018   BILITOT 1.6 (H) 03/30/2018   Recent Labs  Lab 03/30/18 1228  APTT 26  INR 1.05   Assessment/Plan:  1. Acute diarrhea s/t presumed infectious etiology: - C diff negative, GI panel negative for infectious etiology - CT abd/pelvis showing evidence of colitis. Continue antibiotics - Advance diet as tolerated - Consider colonoscopy in outpatient setting once colitis resolves  2. Altered mental status: - Neuro and psych have seen patient. Repeat CT negative - Likely related to metabolic encephalopathy and sepsis s/t colitis - Patient is much more alert today. AMS is resolving.  - Continue to appreciate neurology input    Please call with questions or concerns.    05/30/18  Romie Minus [facility]  [phone]

## 2018-04-02 NOTE — Progress Notes (Signed)
Prime doc paged for 14 beat run of Svt, waiting on call back.

## 2018-04-02 NOTE — Progress Notes (Signed)
Spoke with Dr. Anne Hahn pt had 14 beat run of SVT, md acknowledged no new orders at this time.

## 2018-04-02 NOTE — Progress Notes (Signed)
Pt is alert and conversing with staff. Pt able to follow commands and move all extreme ties.

## 2018-04-03 LAB — BASIC METABOLIC PANEL
Anion gap: 7 (ref 5–15)
BUN: 18 mg/dL (ref 8–23)
CO2: 18 mmol/L — ABNORMAL LOW (ref 22–32)
Calcium: 8.3 mg/dL — ABNORMAL LOW (ref 8.9–10.3)
Chloride: 123 mmol/L — ABNORMAL HIGH (ref 98–111)
Creatinine, Ser: 0.94 mg/dL (ref 0.44–1.00)
GFR calc non Af Amer: >60 mL/min (ref >60)
Glucose, Bld: 80 mg/dL (ref 70–99)
POTASSIUM: 3 mmol/L — AB (ref 3.5–5.1)
SODIUM: 148 mmol/L — AB (ref 135–145)

## 2018-04-03 LAB — CBC
HCT: 33.2 % — ABNORMAL LOW (ref 35.0–47.0)
Hemoglobin: 11.2 g/dL — ABNORMAL LOW (ref 12.0–16.0)
MCH: 29.8 pg (ref 26.0–34.0)
MCHC: 33.9 g/dL (ref 32.0–36.0)
MCV: 87.9 fL (ref 80.0–100.0)
Platelets: 260 10*3/uL (ref 150–440)
RBC: 3.77 MIL/uL — ABNORMAL LOW (ref 3.80–5.20)
RDW: 16.7 % — ABNORMAL HIGH (ref 11.5–14.5)
WBC: 13.9 10*3/uL — AB (ref 3.6–11.0)

## 2018-04-03 LAB — LACTIC ACID, PLASMA: LACTIC ACID, VENOUS: 1.3 mmol/L (ref 0.5–1.9)

## 2018-04-03 MED ORDER — CIPROFLOXACIN HCL 500 MG PO TABS: 500.0000 mg | ORAL_TABLET | Freq: Two times a day (BID) | ORAL | 0 refills | Status: DC

## 2018-04-03 MED ORDER — POTASSIUM CHLORIDE CRYS ER 20 MEQ PO TBCR
60.0000 meq | EXTENDED_RELEASE_TABLET | Freq: Once | ORAL | Status: AC
  Administered 2018-04-03: 60 meq via ORAL
  Filled 2018-04-03: qty 3

## 2018-04-03 MED ORDER — ALPRAZOLAM 1 MG PO TABS: 1.0000 mg | ORAL_TABLET | Freq: Three times a day (TID) | ORAL | 0 refills | Status: DC | PRN

## 2018-04-03 MED ORDER — METRONIDAZOLE 500 MG PO TABS: 500.0000 mg | ORAL_TABLET | Freq: Three times a day (TID) | ORAL | 0 refills | Status: DC

## 2018-04-03 NOTE — Progress Notes (Signed)
[employer] [facility] [place] [facility]   PATIENT NAME: Donna Marsh    MR#:  [id number]  DATE OF BIRTH:  24-Dec-1955  SUBJECTIVE:  CHIEF COMPLAINT:   Chief Complaint  Patient presents with  . Altered Mental Status   The patient is alert, awake and oriented.  She complains of diarrhea and generalized weakness.  REVIEW OF SYSTEMS:  Review of Systems  Constitutional: Positive for malaise/fatigue. Negative for chills and fever.  HENT: Negative for sore throat.   Eyes: Negative for blurred vision and double vision.  Respiratory: Negative for cough, hemoptysis, shortness of breath, wheezing and stridor.   Cardiovascular: Negative for chest pain, palpitations, orthopnea and leg swelling.  Gastrointestinal: Positive for diarrhea. Negative for abdominal pain, blood in stool, melena, nausea and vomiting.  Genitourinary: Negative for dysuria, flank pain and hematuria.  Musculoskeletal: Negative for back pain and joint pain.  Skin: Negative for rash.  Neurological: Negative for dizziness, sensory change, focal weakness, seizures, loss of consciousness, weakness and headaches.  Endo/Heme/Allergies: Negative for polydipsia.  Psychiatric/Behavioral: Negative for depression. The patient is not nervous/anxious.     DRUG ALLERGIES:  No Known Allergies VITALS:  Blood pressure 140/71, pulse 83, temperature 98.6 F (37 C), temperature source Oral, resp. rate 18, height 5\' 3"  (1.6 m), weight 69.8 kg, SpO2 100 %. PHYSICAL EXAMINATION:  Physical Exam  Constitutional: She is oriented to person, place, and time. She appears well-developed. No distress.  HENT:  Head: Normocephalic.  Eyes: Pupils are equal, round, and reactive to light. Conjunctivae and EOM are normal. No scleral icterus.  Neck: Neck supple. No JVD present. No tracheal deviation present.  Cardiovascular: Normal rate, regular rhythm and normal heart sounds. Exam reveals no gallop.  No murmur  heard. Pulmonary/Chest: Effort normal and breath sounds normal. No respiratory distress. She has no wheezes. She has no rales.  Abdominal: Soft. Bowel sounds are normal. She exhibits no distension. There is no tenderness. There is no rebound.  Musculoskeletal: She exhibits no edema or tenderness.  Neurological: She is alert and oriented to person, place, and time. No cranial nerve deficit.  Skin: No rash noted. No erythema.  Psychiatric: She has a normal mood and affect.   LABORATORY PANEL:  Female CBC Recent Labs  Lab 04/03/18 0532  WBC 13.9*  HGB 11.2*  HCT 33.2*  PLT 260   ------------------------------------------------------------------------------------------------------------------ Chemistries  Recent Labs  Lab 03/30/18 1228  04/02/18 0346 04/03/18 0532  NA 135   < > 146* 148*  K 5.4*   < > 3.2* 3.0*  CL 106   < > 122* 123*  CO2 17*   < > 18* 18*  GLUCOSE 197*   < > 76 80  BUN 33*   < > 24* 18  CREATININE 1.59*   < > 1.07* 0.94  CALCIUM 10.8*   < > 8.0* 8.3*  MG  --   --  1.8  --   AST 65*  --   --   --   ALT 39  --   --   --   ALKPHOS 111  --   --   --   BILITOT 1.6*  --   --   --    < > = values in this interval not displayed.   RADIOLOGY:  No results found. ASSESSMENT AND PLAN:   Donna Marsh  is a 62 y.o. female with a known history of chronic back pain gets narcotics from her primary care physician, h/o Alopecia,  history of recurrent UTI, depression comes to the emergency room brought in by EMS after patient was found to be very lethargic diaphoretic and collapsed in the bathroom at home. She did episode of vomiting. According to the husband she is not been eating well for last couple days.  1. sepsis due to acute colitis -presented with altered mental status, diaphoresis and very weak -CT abdomen suggestive of colitis. No pneumonia noted on CT chest. She was on IV Unasyn, changed to po Cipro and Flagyl.  Leukocytosis is improving. Started soft diet.   Pain control.  Leukocytosis is improving, follow-up with CBC and negative blood culture.  2. Lactic acidosis due to above.  Improved.  Acute metabolic encephalopathy due to above.  Aspiration fall precaution. Continue treatment as above. Dr. Norma Fredrickson had concerns about CNS infection. MRI of the brain showed trace parietal subdural hematoma.  But for Dr. Lucia Gaskins, neurologist, it is possible due to motion.  No evidence of CNS infection.  Repeat CT head is unremarkable. Acyclovir, Rocephin and vancomycin were discontinued. Her mental status improved.  3. h/o constipation suspected narcotic induced which patient takes for back pain Hold stool softener due to diarrhea.  4. Anxiety depression -resumed Xanax as needed.  5. Acute renal failure due to dehydration.   Improved with IV fluid support.  Hyperkalemia. Given calcium gluconate, D50, NovoLog and Kayexalate.  Improved.  Hypokalemia.  Given potassium supplement and follow-up level.  Magnesium is normal.  Coffee-ground vomiting. No more coffee-ground vomiting.  Hemoglobin is stable.  Changed to p.o. Protonix.  Generalized weakness.  PT evaluation. Discussed with Dr. Thad Ranger. All the records are reviewed and case discussed with Care Management/Social Worker. Management plans discussed with the patient's husband and daughter and they are in agreement.  CODE STATUS: Full Code  TOTAL TIME TAKING CARE OF THIS PATIENT: 35 minutes.   More than 50% of the time was spent in counseling/coordination of care: YES  POSSIBLE D/C IN 2 DAYS, DEPENDING ON CLINICAL CONDITION.   Shaune Pollack M.D on 04/03/2018 at 1:18 PM  Between 7am to 6pm - Pager - [phone]  After 6pm go to [email or web address] - Therapist, nutritional Hospitalists

## 2018-04-03 NOTE — Plan of Care (Signed)

## 2018-04-03 NOTE — Evaluation (Signed)
Physical Therapy Evaluation Patient Details Name: Donna Marsh MRN: [id number] DOB: 1956-06-19 Today's Date: 04/03/2018   History of Present Illness  62 y.o. female with a known history of chronic back pain gets narcotics from her primary care physician, h/o Alopecia, history of recurrent UTI, depression comes to the emergency room brought in by EMS after patient was found to be very lethargic diaphoretic and collapsed in the bathroom at home. She did episode of vomiting  Clinical Impression  Pt showed good effort t/o PT exam but was somewhat limited secondary to mental status and confusion.  She was physically able to get to sitting and then to standing but once upright showed hesitancy and some unsteadiness.  She had issues with BM on standing up and did need to use the commode, after getting cleaned up and we went for a longer bout of gait training/stair training she had another bout of loose stool and we returned to the room.  Pt will have 24/7 supervision at home and should be able to return home with HHPT safely but will need to use walker and per mental status need direct supervision for safety.  At baseline pt is apparently able to be very active and independent, she is most significantly limited at this time due to AMS.      Follow Up Recommendations Home health PT;Supervision/Assistance - 24 hour    Equipment Recommendations  None recommended by PT    Recommendations for Other Services       Precautions / Restrictions Precautions Precautions: Fall Restrictions Weight Bearing Restrictions: No      Mobility  Bed Mobility Overal bed mobility: Independent             General bed mobility comments: Pt did well with getting to EOB w/o physical assist  Transfers Overall transfer level: Independent Equipment used: Rolling walker (2 wheeled)             General transfer comment: Pt was able to rise with and w/o walker w/o direct assist. However she was slow and  cautious and lacked confidence.  Ambulation/Gait Ambulation/Gait assistance: Min guard Gait Distance (Feet): 125 Feet Assistive device: Rolling walker (2 wheeled);1 person hand held assist;None       General Gait Details: Pt is able to ambulate into the hallway with FWW and then we transitioned to using hallway rail with attempts to transition to no UEs/AD she struggled.  She was able to manage a few steps without UE assist but was unsteady and clearly lacked confidence.  Pt with no overt LOBs but general unsteadiness without at least a single UE assist  Stairs Stairs: Yes Stairs assistance: Min assist Stair Management: Forwards;Step to pattern(single UE assist) Number of Stairs: 4 General stair comments: Pt showed unsteadiness and lack of confidence with stair negotiation, needed heavy UE use from PT HHA and was inconsistent with strategy (step-to vs recip)  Wheelchair Mobility    Modified Rankin (Stroke Patients Only)       Balance Overall balance assessment: Needs assistance Sitting-balance support: No upper extremity supported Sitting balance-Leahy Scale: Normal     Standing balance support: No upper extremity supported Standing balance-Leahy Scale: Fair Standing balance comment: Pt displayed consistent unsteadiness w/o some UE support, good balance with walker                             Pertinent Vitals/Pain Pain Assessment: No/denies pain    Home Living Family/patient expects  to be discharged to:: Private residence Living Arrangements: Spouse/significant other;Other relatives Available Help at Discharge: Family;Available 24 hours/day Type of Home: House Home Access: Stairs to enter Entrance Stairs-Rails: None Entrance Stairs-Number of Steps: 3   Home Equipment: Walker - 2 wheels      Prior Function Level of Independence: Independent         Comments: Pt was apparently very active, driving, running errands, etc     Hand Dominance         Extremity/Trunk Assessment   Upper Extremity Assessment Upper Extremity Assessment: Overall WFL for tasks assessed;Generalized weakness(L grip strength minimall weaker than R, WFL b/l)    Lower Extremity Assessment Lower Extremity Assessment: Overall WFL for tasks assessed       Communication   Communication: (vague confusion t/o session, per family at times very off)  Cognition Arousal/Alertness: Awake/alert Behavior During Therapy: WFL for tasks assessed/performed Overall Cognitive Status: Impaired/Different from baseline Area of Impairment: Attention;Awareness;Safety/judgement;Following commands                               General Comments: Pt at times struggled to fully follow simple instructions, was able to engage in basic conversation appropriately but had regualr low grade confusion      General Comments      Exercises     Assessment/Plan    PT Assessment Patient needs continued PT services  PT Problem List Decreased strength;Decreased activity tolerance;Decreased balance;Decreased mobility;Decreased coordination;Decreased cognition;Decreased knowledge of use of DME;Decreased safety awareness       PT Treatment Interventions Gait training;Stair training;Functional mobility training;Therapeutic activities;Therapeutic exercise;Balance training;Neuromuscular re-education;DME instruction;Patient/family education    PT Goals (Current goals can be found in the Care Plan section)  Acute Rehab PT Goals Patient Stated Goal: Go home PT Goal Formulation: With patient Time For Goal Achievement: 04/17/18 Potential to Achieve Goals: Good    Frequency Min 2X/week   Barriers to discharge        Co-evaluation               AM-PAC PT "6 Clicks" Daily Activity  Outcome Measure Difficulty turning over in bed (including adjusting bedclothes, sheets and blankets)?: None Difficulty moving from lying on back to sitting on the side of the bed? :  None Difficulty sitting down on and standing up from a chair with arms (e.g., wheelchair, bedside commode, etc,.)?: A Little Help needed moving to and from a bed to chair (including a wheelchair)?: A Little Help needed walking in hospital room?: A Little Help needed climbing 3-5 steps with a railing? : A Little 6 Click Score: 20    End of Session Equipment Utilized During Treatment: Gait belt Activity Tolerance: Patient tolerated treatment well Patient left: in chair;with family/visitor present;with call bell/phone within reach Nurse Communication: Mobility status(need for BM clean up) PT Visit Diagnosis: Muscle weakness (generalized) (M62.81);Difficulty in walking, not elsewhere classified (R26.2);Unsteadiness on feet (R26.81)    Time: 1410-1441 PT Time Calculation (min) (ACUTE ONLY): 31 min   Charges:   PT Evaluation $PT Eval Low Complexity: 1 Low PT Treatments $Gait Training: 8-22 mins        Malachi Pro, DPT 04/03/2018, 3:17 PM

## 2018-04-04 LAB — BASIC METABOLIC PANEL
ANION GAP: 9 (ref 5–15)
BUN: 19 mg/dL (ref 8–23)
CHLORIDE: 119 mmol/L — AB (ref 98–111)
CO2: 17 mmol/L — ABNORMAL LOW (ref 22–32)
Calcium: 8.2 mg/dL — ABNORMAL LOW (ref 8.9–10.3)
Creatinine, Ser: 1.11 mg/dL — ABNORMAL HIGH (ref 0.44–1.00)
GFR, EST NON AFRICAN AMERICAN: 52 mL/min — AB (ref >60)
Glucose, Bld: 84 mg/dL (ref 70–99)
POTASSIUM: 3.2 mmol/L — AB (ref 3.5–5.1)
SODIUM: 145 mmol/L (ref 135–145)

## 2018-04-04 LAB — CULTURE, BLOOD (ROUTINE X 2)
CULTURE: NO GROWTH
Culture: NO GROWTH
Special Requests: ADEQUATE

## 2018-04-04 LAB — CBC
HCT: 38.2 % (ref 35.0–47.0)
HEMOGLOBIN: 12.4 g/dL (ref 12.0–16.0)
MCH: 29.7 pg (ref 26.0–34.0)
MCHC: 32.5 g/dL (ref 32.0–36.0)
MCV: 91.4 fL (ref 80.0–100.0)
Platelets: 251 10*3/uL (ref 150–440)
RBC: 4.18 MIL/uL (ref 3.80–5.20)
RDW: 17.8 % — ABNORMAL HIGH (ref 11.5–14.5)
WBC: 12.7 10*3/uL — AB (ref 3.6–11.0)

## 2018-04-04 MED ORDER — ALPRAZOLAM 1 MG PO TABS
1.0000 mg | ORAL_TABLET | Freq: Two times a day (BID) | ORAL | Status: DC
  Administered 2018-04-04 – 2018-04-05 (×2): 1 mg via ORAL
  Filled 2018-04-04 (×2): qty 1

## 2018-04-04 MED ORDER — TEMAZEPAM 15 MG PO CAPS
15.0000 mg | ORAL_CAPSULE | Freq: Every day | ORAL | Status: DC
  Administered 2018-04-04: 15 mg via ORAL
  Filled 2018-04-04: qty 1

## 2018-04-04 MED ORDER — POTASSIUM CHLORIDE CRYS ER 20 MEQ PO TBCR
40.0000 meq | EXTENDED_RELEASE_TABLET | Freq: Once | ORAL | Status: AC
  Administered 2018-04-04: 40 meq via ORAL
  Filled 2018-04-04: qty 2

## 2018-04-04 NOTE — Plan of Care (Signed)

## 2018-04-04 NOTE — Consult Note (Signed)
[facility] Face-to-Face Psychiatry Consult   Reason for Consult: Consult for this 62 year old woman with a history of chronic anxiety.  Repeat consult because she continues to be confused Referring Physician: Bridgett Larsson Patient Identification: Donna Marsh MRN:  [id number] Principal Diagnosis: Altered mental status Diagnosis:   Patient Active Problem List   Diagnosis Date Noted  . Altered mental status [R41.82] 03/31/2018  . Colitis [K52.9] 03/30/2018  . TIA (transient ischemic attack) [G45.9] 11/28/2016  . Spinal stenosis [M48.00] 01/08/2015  . 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 female patient admitted with "I am still confused".  HPI: This is a repeat consult.  Saw this patient when she first came into the hospital several days ago.  At that point she was delirious although with some peculiar symptoms.  Did not appear to have a specific psychiatric illness.  I have not been back to see her but have been following the chart.  Patient was diagnosed with sepsis from colitis.  Had neuro consult.  No other specific finding.  Apparently she was doing fairly well yesterday but today things have gotten worse.  She has been having confused thinking all through the day.  I got history from the patient and from her husband.  Patient told me that she realized that she was not feeling normal and that she was having trouble remembering things.  Denied having any actual hallucinations.  Denied suicidal or homicidal thought.  Husband reported that the patient has been very confused all day.  Has been repeating bizarre statements.  During our interview the patient had told me that her dog had been hit by a car.  Husband told me that that was completely untrue and that the patient somehow has this fixed idea.  Neurologically the patient is awake and alert and oriented to her situation.  Her short-term memory  still is impaired to examination.  Social history: Lives at home with her husband.  Says that she has a lot of chronic stress in her life although none of it sounds to obviously dramatic.  Medical history: Colitis with sepsis.  Past history of stroke.  Patient has alopecia totalis  Substance abuse history: Patient has chronic use of Xanax and narcotics for chronic pain but the husband and the patient both swear that she does not misuse her medicine.  Past Psychiatric History: Past history of chronic anxiety treated as an outpatient.  Risk to Self:   Risk to Others:   Prior Inpatient Therapy:   Prior Outpatient Therapy:    Past Medical History:  Past Medical History:  Diagnosis Date  . Alopecia universalis   . Anemia    in 1979  . Anginal pain ([place])    hx of CP in 2013, went to the hospital, everything was negative  . Anxiety   . Arthritis    RA AND OA .    Marland Kitchen Blood transfusion 1979   WITH PREGNANCY  . Colitis 04/02/2018  . Depression   . Edema    HX OF SWELLING OF FEET, LEGS, HANDS AND FACE--TAKES TRIAMTERENE/HCTZ FOR PREVENTION--DOES NOT HAVE HYPERTENSION AND HAS BEEN TOLD B/P USUALLY LOW  . GERD (gastroesophageal reflux disease)    OCCAS--OTC MED IF ANYTHING NEEDED  . Headache(784.0)    HX OF MIGRAINES  . Hypercholesterolemia   . PONV (postoperative nausea and vomiting)   . URI (upper respiratory infection)  URI IN OCT 2012--PT FEELS CLEAR NOW--DOES NOT USUALLY HAVE ANY PROBLEMS WITH URI  . UTI (lower urinary tract infection)    FREQUENTLY--JUST FINISHED ANTIBIOTIC SAT 06/25/11 FOR UTI  . Varicose veins    LEFT LEG    Past Surgical History:  Procedure Laterality Date  . ABDOMINAL HYSTERECTOMY    . BACK SURGERY  2014   [place]  . CHOLECYSTECTOMY  2011  . JOINT REPLACEMENT  JAN 2006   LEFT TOTAL KNEE ARTHROPLASTY  A  . JOINT REPLACEMENT  JAN 2012   RIGHT TOTAL KNEE ARTHROPLASTY  . TOTAL KNEE REVISION  07/04/2011   Procedure: bilateral TOTAL KNEE REVISION;   Surgeon: Mauri Pole;  Location: [facility];  Service: Orthopedics;  Laterality: Right;  Right Total Knee Revision with Scar Debridement/Patella Revision with Poly Exchange   Family History:  Family History  Problem Relation Age of Onset  . Breast cancer Mother   . CAD Father   . Stroke Paternal Grandfather    Family Psychiatric  History: None known Social History:  Social History   Substance and Sexual Activity  Alcohol Use Yes   Comment: RARELY     Social History   Substance and Sexual Activity  Drug Use No    Social History   Socioeconomic History  . Marital status: Married    Spouse name: Not on file  . Number of children: Not on file  . Years of education: Not on file  . Highest education level: Not on file  Occupational History  . Not on file  Social Needs  . Financial resource strain: Not on file  . Food insecurity:    Worry: Not on file    Inability: Not on file  . Transportation needs:    Medical: Not on file    Non-medical: Not on file  Tobacco Use  . Smoking status: Never Smoker  . Smokeless tobacco: Never Used  Substance and Sexual Activity  . Alcohol use: Yes    Comment: RARELY  . Drug use: No  . Sexual activity: Yes  Lifestyle  . Physical activity:    Days per week: Not on file    Minutes per session: Not on file  . Stress: Not on file  Relationships  . Social connections:    Talks on phone: Not on file    Gets together: Not on file    Attends religious service: Not on file    Active member of club or organization: Not on file    Attends meetings of clubs or organizations: Not on file    Relationship status: Not on file  Other Topics Concern  . Not on file  Social History Narrative  . Not on file   Additional Social History:    Allergies:  No Known Allergies  Labs:  Results for orders placed or performed during the hospital encounter of 03/30/18 (from the past 48 hour(s))  CBC     Status: Abnormal   Collection Time: 04/03/18  5:32  AM  Result Value Ref Range   WBC 13.9 (H) 3.6 - 11.0 K/uL   RBC 3.77 (L) 3.80 - 5.20 MIL/uL   Hemoglobin 11.2 (L) 12.0 - 16.0 g/dL   HCT 33.2 (L) 35.0 - 47.0 %   MCV 87.9 80.0 - 100.0 fL   MCH 29.8 26.0 - 34.0 pg   MCHC 33.9 32.0 - 36.0 g/dL   RDW 16.7 (H) 11.5 - 14.5 %   Platelets 260 150 - 440 K/uL  Comment: Performed at [facility], [address] [place], [place] [address]  Basic metabolic panel     Status: Abnormal   Collection Time: 04/03/18  5:32 AM  Result Value Ref Range   Sodium 148 (H) 135 - 145 mmol/L   Potassium 3.0 (L) 3.5 - 5.1 mmol/L   Chloride 123 (H) 98 - 111 mmol/L   CO2 18 (L) 22 - 32 mmol/L   Glucose, Bld 80 70 - 99 mg/dL   BUN 18 8 - 23 mg/dL   Creatinine, Ser 0.94 0.44 - 1.00 mg/dL   Calcium 8.3 (L) 8.9 - 10.3 mg/dL   GFR calc non Af Amer >60 >60 mL/min   GFR calc Af Amer >60 >60 mL/min    Comment: (NOTE) The eGFR has been calculated using the CKD EPI equation. This calculation has not been validated in all clinical situations. eGFR's persistently <60 mL/min signify possible Chronic Kidney Disease.    Anion gap 7 5 - 15    Comment: Performed at [facility], [address] [place], [place] [address]  Lactic acid, plasma     Status: None   Collection Time: 04/03/18  5:32 AM  Result Value Ref Range   Lactic Acid, Venous 1.3 0.5 - 1.9 mmol/L    Comment: Performed at [facility], [address] [place], [place] [address]  CBC     Status: Abnormal   Collection Time: 04/04/18  5:50 AM  Result Value Ref Range   WBC 12.7 (H) 3.6 - 11.0 K/uL   RBC 4.18 3.80 - 5.20 MIL/uL   Hemoglobin 12.4 12.0 - 16.0 g/dL   HCT 38.2 35.0 - 47.0 %   MCV 91.4 80.0 - 100.0 fL   MCH 29.7 26.0 - 34.0 pg   MCHC 32.5 32.0 - 36.0 g/dL   RDW 17.8 (H) 11.5 - 14.5 %   Platelets 251 150 - 440 K/uL    Comment: Performed at [facility], [address] [place], [place] [address]  Basic metabolic panel     Status: Abnormal   Collection  Time: 04/04/18  5:50 AM  Result Value Ref Range   Sodium 145 135 - 145 mmol/L   Potassium 3.2 (L) 3.5 - 5.1 mmol/L   Chloride 119 (H) 98 - 111 mmol/L   CO2 17 (L) 22 - 32 mmol/L   Glucose, Bld 84 70 - 99 mg/dL   BUN 19 8 - 23 mg/dL   Creatinine, Ser 1.11 (H) 0.44 - 1.00 mg/dL   Calcium 8.2 (L) 8.9 - 10.3 mg/dL   GFR calc non Af Amer 52 (L) >60 mL/min   GFR calc Af Amer >60 >60 mL/min    Comment: (NOTE) The eGFR has been calculated using the CKD EPI equation. This calculation has not been validated in all clinical situations. eGFR's persistently <60 mL/min signify possible Chronic Kidney Disease.    Anion gap 9 5 - 15    Comment: Performed at [facility], [address] [place], [place] [address]    Current Facility-Administered Medications  Medication Dose Route Frequency Provider Last Rate Last Dose  . acetaminophen (TYLENOL) tablet 650 mg  650 mg Oral Q6H PRN Fritzi Mandes, MD       Or  . acetaminophen (TYLENOL) suppository 650 mg  650 mg Rectal Q6H PRN Fritzi Mandes, MD      . ALPRAZolam Duanne Moron) tablet 1 mg  1 mg Oral BID Daemien Fronczak, Madie Reno, MD      .  atorvastatin (LIPITOR) tablet 40 mg  40 mg Oral q1800 Demetrios Loll, MD   40 mg at 04/04/18 1802  . ciprofloxacin (CIPRO) tablet 500 mg  500 mg Oral BID Demetrios Loll, MD   500 mg at 04/04/18 1052  . escitalopram (LEXAPRO) tablet 20 mg  20 mg Oral Daily Demetrios Loll, MD   20 mg at 04/04/18 1053  . metroNIDAZOLE (FLAGYL) tablet 500 mg  500 mg Oral Q8H Demetrios Loll, MD   500 mg at 04/04/18 1524  . ondansetron (ZOFRAN) tablet 4 mg  4 mg Oral Q6H PRN Fritzi Mandes, MD       Or  . ondansetron (ZOFRAN) injection 4 mg  4 mg Intravenous Q6H PRN Fritzi Mandes, MD      . polyethylene glycol (MIRALAX / GLYCOLAX) packet 17 g  17 g Oral Daily PRN Fritzi Mandes, MD        Musculoskeletal: Strength & Muscle Tone: decreased Gait & Station: normal Patient leans: N/A  Psychiatric Specialty Exam: Physical Exam  Nursing note and vitals  reviewed. Constitutional: She appears well-developed and well-nourished.  HENT:  Head: Normocephalic and atraumatic.  Eyes: Pupils are equal, round, and reactive to light. Conjunctivae are normal.  Neck: Normal range of motion.  Cardiovascular: Regular rhythm and normal heart sounds.  Respiratory: Effort normal. No respiratory distress.  GI: Soft.  Musculoskeletal: Normal range of motion.  Neurological: She is alert.  Skin: Skin is warm and dry.  Alopecia totalis which is a chronic condition and has been previously documented  Psychiatric: Her affect is blunt. Her speech is delayed. She is slowed. Thought content is delusional. Cognition and memory are impaired. She expresses impulsivity. She expresses no homicidal and no suicidal ideation.    Review of Systems  Constitutional: Negative.   HENT: Negative.   Eyes: Negative.   Respiratory: Negative.   Cardiovascular: Negative.   Gastrointestinal: Negative.   Musculoskeletal: Negative.   Skin: Negative.   Neurological: Negative.   Psychiatric/Behavioral: Positive for depression, hallucinations and memory loss. Negative for substance abuse and suicidal ideas. The patient is nervous/anxious and has insomnia.     Blood pressure 139/77, pulse 78, temperature 98.2 F (36.8 C), temperature source Oral, resp. rate 17, height 5' 3"  (1.6 m), weight 69.8 kg, SpO2 100 %.Body mass index is 27.26 kg/m.  General Appearance: Casual  Eye Contact:  Good  Speech:  Slow  Volume:  Decreased  Mood:  Anxious  Affect:  Blunt  Thought Process:  Disorganized  Orientation:  Full (Time, Place, and Person)  Thought Content:  Illogical, Paranoid Ideation, Rumination and Tangential  Suicidal Thoughts:  No  Homicidal Thoughts:  No  Memory:  Immediate;   Fair Recent;   Poor Remote;   Fair  Judgement:  Impaired  Insight:  Shallow  Psychomotor Activity:  Normal  Concentration:  Concentration: Fair  Recall:  Poor  Fund of Knowledge:  Fair  Language:   Fair  Akathisia:  No  Handed:  Right  AIMS (if indicated):     Assets:  Desire for Improvement Housing Resilience Social Support  ADL's:  Impaired  Cognition:  Impaired,  Mild  Sleep:        Treatment Plan Summary: Medication management and Plan Patient is having a downturn today with more confusion.  He does not appear from what I can see to have any secondary gain to it.  She is actually wanting to be discharged home and that is being held up by her confusion today.  She  does not appear to be psychotic and does not have a history of psychotic illness but appears to probably still be having a low-grade delirium.  After reviewing the situation with her my best guess is that she is having some withdrawal from her chronic use of Xanax.  She is only getting it "as needed" which means not as much as she normally takes at home.  I reassured the patient that this is all probably going to get better pretty soon.  I am putting her back on the Xanax 1 mg twice a day and I am also going to add some medication to help her get a better night sleep tonight for this 1 night.  We will reevaluate in the morning.  Disposition: No evidence of imminent risk to self or others at present.   Patient does not meet criteria for psychiatric inpatient admission. Supportive therapy provided about ongoing stressors. Discussed crisis plan, support from social network, calling 911, coming to the Emergency Department, and calling Suicide Hotline.  Alethia Berthold, MD 04/04/2018 7:06 PM

## 2018-04-04 NOTE — Plan of Care (Signed)
  Problem: Education: Goal: Knowledge of General Education information will improve Description Including pain rating scale, medication(s)/side effects and non-pharmacologic comfort measures 04/04/2018 0554 by Roxana Hires, RN Outcome: Not Progressing 04/04/2018 0552 by Roxana Hires, RN Outcome: Progressing   Problem: Health Behavior/Discharge Planning: Goal: Ability to manage health-related needs will improve 04/04/2018 0554 by Larina Lieurance, Genelle Gather, RN Outcome: Not Progressing 04/04/2018 0552 by Roxana Hires, RN Outcome: Progressing   Problem: Clinical Measurements: Goal: Ability to maintain clinical measurements within normal limits will improve 04/04/2018 0554 by Avryl Roehm, Genelle Gather, RN Outcome: Not Progressing 04/04/2018 0552 by Roxana Hires, RN Outcome: Progressing Goal: Will remain free from infection 04/04/2018 0554 by Roxana Hires, RN Outcome: Not Progressing 04/04/2018 0552 by Roxana Hires, RN Outcome: Progressing Goal: Diagnostic test results will improve 04/04/2018 0554 by Toddy Boyd, Genelle Gather, RN Outcome: Not Progressing 04/04/2018 0552 by Roxana Hires, RN Outcome: Progressing Goal: Respiratory complications will improve 04/04/2018 0554 by Roxana Hires, RN Outcome: Progressing 04/04/2018 0552 by Roxana Hires, RN Outcome: Progressing Goal: Cardiovascular complication will be avoided 04/04/2018 0554 by Roxana Hires, RN Outcome: Progressing 04/04/2018 0552 by Roxana Hires, RN Outcome: Progressing   Problem: Activity: Goal: Risk for activity intolerance will decrease 04/04/2018 0554 by Roxana Hires, RN Outcome: Not Progressing 04/04/2018 0552 by Roxana Hires, RN Outcome: Progressing   Problem: Coping: Goal: Level of anxiety will decrease 04/04/2018 0554 by Roxana Hires, RN Outcome: Not Progressing 04/04/2018 0552 by Roxana Hires, RN Outcome: Progressing   Problem:  Elimination: Goal: Will not experience complications related to bowel motility 04/04/2018 0554 by Roxana Hires, RN Outcome: Not Progressing 04/04/2018 0552 by Roxana Hires, RN Outcome: Progressing Goal: Will not experience complications related to urinary retention 04/04/2018 0554 by Venita Seng, Genelle Gather, RN Outcome: Not Progressing 04/04/2018 0552 by Roxana Hires, RN Outcome: Progressing

## 2018-04-04 NOTE — Discharge Instructions (Signed)
HHPT °

## 2018-04-04 NOTE — Progress Notes (Signed)
[employer] - [place] [facility]   PATIENT NAME: Donna Marsh    MR#:  [id number]  DATE OF BIRTH:  [birth year]  SUBJECTIVE:  CHIEF COMPLAINT:   Chief Complaint  Patient presents with  . Altered Mental Status   The patient is alert, awake and oriented, but she is confused during conversation. REVIEW OF SYSTEMS:  Review of Systems  Constitutional: Positive for malaise/fatigue. Negative for chills and fever.  HENT: Negative for sore throat.   Eyes: Negative for blurred vision and double vision.  Respiratory: Negative for cough, hemoptysis, shortness of breath, wheezing and stridor.   Cardiovascular: Negative for chest pain, palpitations, orthopnea and leg swelling.  Gastrointestinal: Negative for abdominal pain, blood in stool, diarrhea, melena, nausea and vomiting.  Genitourinary: Negative for dysuria, flank pain and hematuria.  Musculoskeletal: Negative for back pain and joint pain.  Skin: Negative for rash.  Neurological: Negative for dizziness, sensory change, focal weakness, seizures, loss of consciousness, weakness and headaches.  Endo/Heme/Allergies: Negative for polydipsia.  Psychiatric/Behavioral: Negative for depression. The patient is not nervous/anxious.     DRUG ALLERGIES:  No Known Allergies VITALS:  Blood pressure 137/75, pulse 78, temperature 98.3 F (36.8 C), temperature source Oral, resp. rate 17, height 5\' 3"  (1.6 m), weight 69.8 kg, SpO2 100 %. PHYSICAL EXAMINATION:  Physical Exam  Constitutional: She is oriented to person, place, and time. She appears well-developed. No distress.  HENT:  Head: Normocephalic.  Eyes: Pupils are equal, round, and reactive to light. Conjunctivae and EOM are normal. No scleral icterus.  Neck: Neck supple. No JVD present. No tracheal deviation present.  Cardiovascular: Normal rate, regular rhythm and normal heart sounds. Exam reveals no gallop.  No murmur heard. Pulmonary/Chest: Effort normal and  breath sounds normal. No respiratory distress. She has no wheezes. She has no rales.  Abdominal: Soft. Bowel sounds are normal. She exhibits no distension. There is no tenderness. There is no rebound.  Musculoskeletal: She exhibits no edema or tenderness.  Neurological: She is alert and oriented to person, place, and time. No cranial nerve deficit.  Skin: No rash noted. No erythema.  Psychiatric: She has a normal mood and affect.   LABORATORY PANEL:  Female CBC Recent Labs  Lab 04/04/18 0550  WBC 12.7*  HGB 12.4  HCT 38.2  PLT 251   ------------------------------------------------------------------------------------------------------------------ Chemistries  Recent Labs  Lab 03/30/18 1228  04/02/18 0346  04/04/18 0550  NA 135   < > 146*   < > 145  K 5.4*   < > 3.2*   < > 3.2*  CL 106   < > 122*   < > 119*  CO2 17*   < > 18*   < > 17*  GLUCOSE 197*   < > 76   < > 84  BUN 33*   < > 24*   < > 19  CREATININE 1.59*   < > 1.07*   < > 1.11*  CALCIUM 10.8*   < > 8.0*   < > 8.2*  MG  --   --  1.8  --   --   AST 65*  --   --   --   --   ALT 39  --   --   --   --   ALKPHOS 111  --   --   --   --   BILITOT 1.6*  --   --   --   --    < > =  values in this interval not displayed.   RADIOLOGY:  No results found. ASSESSMENT AND PLAN:   Donna Marsh  is a 62 y.o. female with a known history of chronic back pain gets narcotics from her primary care physician, h/o Alopecia, history of recurrent UTI, depression comes to the emergency room brought in by EMS after patient was found to be very lethargic diaphoretic and collapsed in the bathroom at home. She did episode of vomiting. According to the husband she is not been eating well for last couple days.  1. sepsis due to acute colitis -presented with altered mental status, diaphoresis and very weak -CT abdomen suggestive of colitis. No pneumonia noted on CT chest. She was on IV Unasyn, changed to po Cipro and Flagyl.  Leukocytosis is  improving. Started soft diet.  Pain control.  Leukocytosis is improving and negative blood culture.  2. Lactic acidosis due to above.  Improved.  Acute metabolic encephalopathy due to above.  Aspiration fall precaution. Continue treatment as above. Dr. Norma Fredrickson had concerns about CNS infection. MRI of the brain showed trace parietal subdural hematoma.  But for Dr. Lucia Gaskins, neurologist, it is possible due to motion.  No evidence of CNS infection.  Repeat CT head is unremarkable. Acyclovir, Rocephin and vancomycin were discontinued. Her mental status improved.  She is still confused and had hallucination. Psych reconsult.  3. h/o constipation suspected narcotic induced which patient takes for back pain Hold stool softener due to diarrhea.  4. Anxiety depression -resumed Xanax as needed.  5. Acute renal failure due to dehydration.   Improved with IV fluid support.  Hyperkalemia. Given calcium gluconate, D50, NovoLog and Kayexalate.  Improved.  Hypokalemia.  Given potassium supplement and follow-up level.  Magnesium is normal.  Coffee-ground vomiting. No more coffee-ground vomiting.  Hemoglobin is stable.  Changed to p.o. Protonix.  Generalized weakness.  PT evaluation; HHPT.  All the records are reviewed and case discussed with Care Management/Social Worker. Management plans discussed with the patient's husband and daughter and they are in agreement.  CODE STATUS: Full Code  TOTAL TIME TAKING CARE OF THIS PATIENT: 32 minutes.   More than 50% of the time was spent in counseling/coordination of care: YES  POSSIBLE D/C IN 1-2 DAYS, DEPENDING ON CLINICAL CONDITION.   Shaune Pollack M.D on 04/04/2018 at 2:01 PM  Between 7am to 6pm - Pager - [phone]  After 6pm go to [email or web address] - Therapist, nutritional Hospitalists
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
## 2018-04-04 NOTE — Progress Notes (Signed)
Xanax ineffective.  Patient constantly jumping out of bed.  Bath given to patient soiling bed.  Will cont to monitor

## 2018-04-05 DIAGNOSIS — R4182 Altered mental status, unspecified: Secondary | ICD-10-CM

## 2018-04-05 MED ORDER — ALPRAZOLAM 1 MG PO TABS: 1.0000 mg | ORAL_TABLET | Freq: Two times a day (BID) | ORAL | 0 refills | Status: DC

## 2018-04-05 NOTE — Discharge Summary (Signed)
[employer] [facility] [place] [facility]   PATIENT NAME: Donna Marsh    MR#:  [id number]  DATE OF BIRTH:  1956/07/09  DATE OF ADMISSION:  03/30/2018   ADMITTING PHYSICIAN: Enedina Finner, MD  DATE OF DISCHARGE: 04/05/2018 PRIMARY CARE PHYSICIAN: Dortha Kern, MD   ADMISSION DIAGNOSIS:  Colitis [K52.9] Sepsis, due to unspecified organism (HCC) [A41.9] Altered mental status, unspecified altered mental status type [R41.82] DISCHARGE DIAGNOSIS:  Principal Problem:   Altered mental status Active Problems:   Colitis  SECONDARY DIAGNOSIS:   Past Medical History:  Diagnosis Date  . Alopecia universalis   . Anemia    in 1979  . Anginal pain (HCC)    hx of CP in 2013, went to the hospital, everything was negative  . Anxiety   . Arthritis    RA AND OA .    Marland Kitchen Blood transfusion 1979   WITH PREGNANCY  . Colitis 04/02/2018  . Depression   . Edema    HX OF SWELLING OF FEET, LEGS, HANDS AND FACE--TAKES TRIAMTERENE/HCTZ FOR PREVENTION--DOES NOT HAVE HYPERTENSION AND HAS BEEN TOLD B/P USUALLY LOW  . GERD (gastroesophageal reflux disease)    OCCAS--OTC MED IF ANYTHING NEEDED  . Headache(784.0)    HX OF MIGRAINES  . Hypercholesterolemia   . PONV (postoperative nausea and vomiting)   . URI (upper respiratory infection)    URI IN OCT 2012--PT FEELS CLEAR NOW--DOES NOT USUALLY HAVE ANY PROBLEMS WITH URI  . UTI (lower urinary tract infection)    FREQUENTLY--JUST FINISHED ANTIBIOTIC SAT 06/25/11 FOR UTI  . Varicose veins    LEFT LEG   HOSPITAL COURSE:  RebeccaRobertsonis a62 y.o.femalewith a known history of chronic back pain gets narcotics from her primary care physician,h/o Alopecia,history of recurrent UTI, depression comes to the emergency room brought in by EMS after patient was found to be very lethargic diaphoretic and collapsed in the bathroom at home. She did episode of vomiting. According to the husband she is not been eating well for last couple  days.  1.sepsis due to acute colitis -presented with altered mental status, diaphoresis and very weak -CT abdomen suggestive of colitis. No pneumonia noted on CT chest. She was on IVUnasyn, changed to po Cipro and Flagyl.  Leukocytosis is improving. Started soft diet.  Pain control.  Leukocytosis improved and negative blood culture.  2. Lactic acidosis due to above.  Improved.  Acute metabolic encephalopathy due to above.  Aspiration fall precaution. Continue treatment as above. Dr. Norma Fredrickson had concerns about CNS infection. MRI of the brain showed trace parietal subdural hematoma.  But for Dr. Lucia Gaskins, neurologist, it is possible due to motion.  No evidence of CNS infection.  Repeat CT head is unremarkable. Acyclovir, Rocephin and vancomycin were discontinued. Her mental status improved.  She is still confused and had hallucination.  Per Dr. Toni Amend, the patient may have mild benzodiazepine withdrawal.  Resume Xanax 1 mg twice daily.  The patient has no confusion or hallucination since last night.  3. h/oconstipation suspected narcotic induced which patient takes for back pain Hold stool softener due to diarrhea.  4.Anxiety depression -resumed Xanax as needed.  5.Acute renal failure due to dehydration.   Improved with IV fluid support.  Hyperkalemia. Given calcium gluconate, D50, NovoLog and Kayexalate.  Improved.  Hypokalemia.  Given potassium supplement and follow-up level his PCP.  Magnesium is normal.  Coffee-ground vomiting. No more coffee-ground vomiting.  Hemoglobin is stable.  Changed to p.o. Protonix.  No hematemesis or  melena or bloody stool.  Generalized weakness.  PT evaluation; HHPT.  DISCHARGE CONDITIONS:  Stable, discharge to home with home health and PT. CONSULTS OBTAINED:  Treatment Team:  Anson Fret, MD [place], Jackquline Denmark, MD DRUG ALLERGIES:  No Known Allergies DISCHARGE MEDICATIONS:   Allergies as of 04/05/2018   No Known Allergies      Medication List    TAKE these medications   acetaminophen-codeine 300-30 MG tablet Commonly known as:  TYLENOL #3 Take 1 tablet by mouth every 6 (six) hours as needed for moderate pain or severe pain. Take 1 tablet every 6 to 8 hours as needed for pain.   ALPRAZolam 1 MG tablet Commonly known as:  XANAX Take 1 tablet (1 mg total) by mouth 2 (two) times daily. What changed:  when to take this   aspirin EC 81 MG tablet Take 1 tablet (81 mg total) by mouth daily.   baclofen 20 MG tablet Commonly known as:  LIORESAL Take 1 tablet (20 mg total) by mouth 2 (two) times daily as needed for muscle spasms. What changed:  when to take this   escitalopram 20 MG tablet Commonly known as:  LEXAPRO Take 20 mg by mouth daily.   solifenacin 5 MG tablet Commonly known as:  VESICARE Take 5 mg by mouth daily.   spironolactone 50 MG tablet Commonly known as:  ALDACTONE Take 150-200 mg by mouth daily.   traMADol 50 MG tablet Commonly known as:  ULTRAM Take 1 tablet (50 mg total) by mouth every 6 (six) hours as needed for moderate pain (headache).        DISCHARGE INSTRUCTIONS:  See AVS.  If you experience worsening of your admission symptoms, develop shortness of breath, life threatening emergency, suicidal or homicidal thoughts you must seek medical attention immediately by calling 911 or calling your MD immediately  if symptoms less severe.  You Must read complete instructions/literature along with all the possible adverse reactions/side effects for all the Medicines you take and that have been prescribed to you. Take any new Medicines after you have completely understood and accpet all the possible adverse reactions/side effects.   Please note  You were cared for by a hospitalist during your hospital stay. If you have any questions about your discharge medications or the care you received while you were in the hospital after you are discharged, you can call the unit and asked to speak  with the hospitalist on call if the hospitalist that took care of you is not available. Once you are discharged, your primary care physician will handle any further medical issues. Please note that NO REFILLS for any discharge medications will be authorized once you are discharged, as it is imperative that you return to your primary care physician (or establish a relationship with a primary care physician if you do not have one) for your aftercare needs so that they can reassess your need for medications and monitor your lab values.    On the day of Discharge:  VITAL SIGNS:  Blood pressure 137/83, pulse 84, temperature 98.8 F (37.1 C), temperature source Oral, resp. rate 17, height 5\' 3"  (1.6 m), weight 69.8 kg, SpO2 100 %. PHYSICAL EXAMINATION:  GENERAL:  62 y.o.-year-old patient lying in the bed with no acute distress.  EYES: Pupils equal, round, reactive to light and accommodation. No scleral icterus. Extraocular muscles intact.  HEENT: Head atraumatic, normocephalic. Oropharynx and nasopharynx clear.  NECK:  Supple, no jugular venous distention. No thyroid enlargement, no  tenderness.  LUNGS: Normal breath sounds bilaterally, no wheezing, rales,rhonchi or crepitation. No use of accessory muscles of respiration.  CARDIOVASCULAR: S1, S2 normal. No murmurs, rubs, or gallops.  ABDOMEN: Soft, non-tender, non-distended. Bowel sounds present. No organomegaly or mass.  EXTREMITIES: No pedal edema, cyanosis, or clubbing.  NEUROLOGIC: Cranial nerves II through XII are intact. Muscle strength 5/5 in all extremities. Sensation intact. Gait not checked.  PSYCHIATRIC: The patient is alert and oriented x 3.  SKIN: No obvious rash, lesion, or ulcer.  DATA REVIEW:   CBC Recent Labs  Lab 04/04/18 0550  WBC 12.7*  HGB 12.4  HCT 38.2  PLT 251    Chemistries  Recent Labs  Lab 03/30/18 1228  04/02/18 0346  04/04/18 0550  NA 135   < > 146*   < > 145  K 5.4*   < > 3.2*   < > 3.2*  CL 106   < >  122*   < > 119*  CO2 17*   < > 18*   < > 17*  GLUCOSE 197*   < > 76   < > 84  BUN 33*   < > 24*   < > 19  CREATININE 1.59*   < > 1.07*   < > 1.11*  CALCIUM 10.8*   < > 8.0*   < > 8.2*  MG  --   --  1.8  --   --   AST 65*  --   --   --   --   ALT 39  --   --   --   --   ALKPHOS 111  --   --   --   --   BILITOT 1.6*  --   --   --   --    < > = values in this interval not displayed.     Microbiology Results  Results for orders placed or performed during the hospital encounter of 03/30/18  Blood Culture (routine x 2)     Status: None   Collection Time: 03/30/18 12:28 PM  Result Value Ref Range Status   Specimen Description BLOOD LEFT ANTECUBITAL  Final   Special Requests   Final    BOTTLES DRAWN AEROBIC AND ANAEROBIC Blood Culture adequate volume   Culture   Final    NO GROWTH 5 DAYS Performed at [facility], [address] [place], [place] [address]    Report Status 04/04/2018 FINAL  Final  Blood Culture (routine x 2)     Status: None   Collection Time: 03/30/18 12:28 PM  Result Value Ref Range Status   Specimen Description BLOOD BLOOD LEFT HAND  Final   Special Requests   Final    BOTTLES DRAWN AEROBIC AND ANAEROBIC Blood Culture results may not be optimal due to an inadequate volume of blood received in culture bottles   Culture   Final    NO GROWTH 5 DAYS Performed at [facility], [address], [place], [place] [address]    Report Status 04/04/2018 FINAL  Final  Urine culture     Status: None   Collection Time: 03/30/18  1:10 PM  Result Value Ref Range Status   Specimen Description   Final    URINE, CATHETERIZED Performed at [facility], [address] [place], [place] [address]    Special Requests   Final    NONE Performed at [facility], [address] [place], [place] [address]  Culture   Final    NO GROWTH Performed at [facility], [address], [place], [place] [address]    Report Status 03/31/2018  FINAL  Final  Gastrointestinal Panel by PCR , Stool     Status: None   Collection Time: 03/30/18  6:00 PM  Result Value Ref Range Status   Campylobacter species NOT DETECTED NOT DETECTED Final   Plesimonas shigelloides NOT DETECTED NOT DETECTED Final   Salmonella species NOT DETECTED NOT DETECTED Final   Yersinia enterocolitica NOT DETECTED NOT DETECTED Final   Vibrio species NOT DETECTED NOT DETECTED Final   Vibrio cholerae NOT DETECTED NOT DETECTED Final   Enteroaggregative E coli (EAEC) NOT DETECTED NOT DETECTED Final   Enteropathogenic E coli (EPEC) NOT DETECTED NOT DETECTED Final   Enterotoxigenic E coli (ETEC) NOT DETECTED NOT DETECTED Final   Shiga like toxin producing E coli (STEC) NOT DETECTED NOT DETECTED Final   Shigella/Enteroinvasive E coli (EIEC) NOT DETECTED NOT DETECTED Final   Cryptosporidium NOT DETECTED NOT DETECTED Final   Cyclospora cayetanensis NOT DETECTED NOT DETECTED Final   Entamoeba histolytica NOT DETECTED NOT DETECTED Final   Giardia lamblia NOT DETECTED NOT DETECTED Final   Adenovirus F40/41 NOT DETECTED NOT DETECTED Final   Astrovirus NOT DETECTED NOT DETECTED Final   Norovirus GI/GII NOT DETECTED NOT DETECTED Final   Rotavirus A NOT DETECTED NOT DETECTED Final   Sapovirus (I, II, IV, and V) NOT DETECTED NOT DETECTED Final    Comment: Performed at [facility], [address] [place], [place] [address]  C difficile quick scan w PCR reflex     Status: None   Collection Time: 03/30/18  7:23 PM  Result Value Ref Range Status   C Diff antigen NEGATIVE NEGATIVE Final   C Diff toxin NEGATIVE NEGATIVE Final   C Diff interpretation No C. difficile detected.  Final    Comment: Performed at [facility], [address] [place], [place] [address]    RADIOLOGY:  No results found.   Management plans discussed with the patient, family and they are in agreement.  CODE STATUS: Full Code   TOTAL TIME TAKING CARE OF THIS PATIENT: 32  minutes.    Shaune Pollack M.D on 04/05/2018 at 10:19 AM  Between 7am to 6pm - Pager - [phone]  After 6pm go to [email or web address] - Social research officer, government  [facility] [place] [facility]  Office  [phone]  CC: Primary care physician; Dortha Kern, MD   Note: This dictation was prepared with Dragon dictation along with smaller phrase technology. Any transcriptional errors that result from this process are unintentional.

## 2018-04-05 NOTE — Progress Notes (Addendum)
Pt discharged to home via husbands vehicle. Discharge instructions and medication regimen reviewed at bedside with patient. Pt. verbalizes understanding of instructions and medication regimen. Patient assessment unchanged from this morning. IV discontinued per policy. VSS.

## 2018-04-05 NOTE — Progress Notes (Signed)
Subjective: Patient was seen last 3 days ago.  At that time patient was more awake and alert, she was able to hold conversations without difficulty.  Husband was at the bedside, reported that patient's mental status had continued to improve.  He thought that patient was about 70% back to baseline with intermittent confusion.  At that time it was felt that patient did not appear to have a specific underlying neurologic condition.  Apparently she was doing fairly well until 2 days ago when she was noted to have gotten slightly worse.  She was felt to be having confused thinking all through the day.  We are asked to see her again due to increased confusion.  Objective: Current vital signs: BP 137/83 (BP Location: Left Arm)   Pulse 84   Temp 98.8 F (37.1 C) (Oral)   Resp 17   Ht 5\' 3"  (1.6 m)   Wt 69.8 kg   SpO2 100%   BMI 27.26 kg/m  Vital signs in last 24 hours: Temp:  [98.2 F (36.8 C)-98.8 F (37.1 C)] 98.8 F (37.1 C) (09/12 0811) Pulse Rate:  [78-84] 84 (09/12 0811) BP: (137-139)/(77-83) 137/83 (09/12 0811) SpO2:  [100 %] 100 % (09/12 0811)  Intake/Output from previous day: 09/11 0701 - 09/12 0700 In: -  Out: 2 [Stool:2] Intake/Output this shift: No intake/output data recorded. Nutritional status:  Diet Order            Diet - low sodium heart healthy        Diet regular Room service appropriate? Yes; Fluid consistency: Thin  Diet effective now        Diet - low sodium heart healthy        Diet - low sodium heart healthy              Neurologic Exam: Mental Status: Alert, oriented, thought content appropriate.  Speech fluent without evidence of aphasia.  Able to follow 3 step commands without difficulty. Cranial Nerves: II: Discs flat bilaterally; Visual fields grossly normal, pupils equal, round, reactive to light and accommodation III,IV, VI: ptosis not present, extra-ocular motions intact bilaterally V,VII: smile symmetric, facial light touch sensation normal  bilaterally VIII: hearing normal bilaterally IX,X: gag reflex present XI: bilateral shoulder shrug XII: midline tongue extension Motor: Able to lift all extremities against gravity with no focal weakness noted.   Sensory: Pinprick and light touch intact throughout, bilaterally  Lab Results: Basic Metabolic Panel: Recent Labs  Lab 03/31/18 1312 04/01/18 0209 04/02/18 0346 04/03/18 0532 04/04/18 0550  NA 142 144 146* 148* 145  K 4.3 3.9 3.2* 3.0* 3.2*  CL 123* 120* 122* 123* 119*  CO2 13* 16* 18* 18* 17*  GLUCOSE 96 111* 76 80 84  BUN 40* 36* 24* 18 19  CREATININE 1.47* 1.38* 1.07* 0.94 1.11*  CALCIUM 8.3* 8.0* 8.0* 8.3* 8.2*  MG  --   --  1.8  --   --     Liver Function Tests: Recent Labs  Lab 03/30/18 1228  AST 65*  ALT 39  ALKPHOS 111  BILITOT 1.6*  PROT 8.1  ALBUMIN 4.0   Recent Labs  Lab 03/30/18 1228  LIPASE 25   No results for input(s): AMMONIA in the last 168 hours.  CBC: Recent Labs  Lab 03/30/18 1228 04/01/18 0209 04/01/18 1437 04/02/18 0346 04/03/18 0532 04/04/18 0550  WBC 23.7* 16.4*  --  14.2* 13.9* 12.7*  NEUTROABS 21.3*  --   --   --   --   --  HGB 17.5* 14.7 12.4 11.6* 11.2* 12.4  HCT 54.7* 45.0  --  35.0 33.2* 38.2  MCV 91.4 90.2  --  88.5 87.9 91.4  PLT 390 266  --  238 260 251    Cardiac Enzymes: Recent Labs  Lab 03/30/18 1228  TROPONINI 0.04*    Lipid Panel: No results for input(s): CHOL, TRIG, HDL, CHOLHDL, VLDL, LDLCALC in the last 168 hours.  CBG: Recent Labs  Lab 03/31/18 0824  GLUCAP 112*    Microbiology: Results for orders placed or performed during the hospital encounter of 03/30/18  Blood Culture (routine x 2)     Status: None   Collection Time: 03/30/18 12:28 PM  Result Value Ref Range Status   Specimen Description BLOOD LEFT ANTECUBITAL  Final   Special Requests   Final    BOTTLES DRAWN AEROBIC AND ANAEROBIC Blood Culture adequate volume   Culture   Final    NO GROWTH 5 DAYS Performed at [facility], [address], [place], [place] [address]    Report Status 04/04/2018 FINAL  Final  Blood Culture (routine x 2)     Status: None   Collection Time: 03/30/18 12:28 PM  Result Value Ref Range Status   Specimen Description BLOOD BLOOD LEFT HAND  Final   Special Requests   Final    BOTTLES DRAWN AEROBIC AND ANAEROBIC Blood Culture results may not be optimal due to an inadequate volume of blood received in culture bottles   Culture   Final    NO GROWTH 5 DAYS Performed at [facility], [address] [place], [place] [address]    Report Status 04/04/2018 FINAL  Final  Urine culture     Status: None   Collection Time: 03/30/18  1:10 PM  Result Value Ref Range Status   Specimen Description   Final    URINE, CATHETERIZED Performed at [facility], [address] [place], [place] [address]    Special Requests   Final    NONE Performed at [facility], [address] [place], [place] [address]    Culture   Final    NO GROWTH Performed at [facility], [address], [place], [place] [address]    Report Status 03/31/2018 FINAL  Final  Gastrointestinal Panel by PCR , Stool     Status: None   Collection Time: 03/30/18  6:00 PM  Result Value Ref Range Status   Campylobacter species NOT DETECTED NOT DETECTED Final   Plesimonas shigelloides NOT DETECTED NOT DETECTED Final   Salmonella species NOT DETECTED NOT DETECTED Final   Yersinia enterocolitica NOT DETECTED NOT DETECTED Final   Vibrio species NOT DETECTED NOT DETECTED Final   Vibrio cholerae NOT DETECTED NOT DETECTED Final   Enteroaggregative E coli (EAEC) NOT DETECTED NOT DETECTED Final   Enteropathogenic E coli (EPEC) NOT DETECTED NOT DETECTED Final   Enterotoxigenic E coli (ETEC) NOT DETECTED NOT DETECTED Final   Shiga like toxin producing E coli (STEC) NOT DETECTED NOT DETECTED Final   Shigella/Enteroinvasive E coli (EIEC) NOT DETECTED NOT DETECTED Final   Cryptosporidium  NOT DETECTED NOT DETECTED Final   Cyclospora cayetanensis NOT DETECTED NOT DETECTED Final   Entamoeba histolytica NOT DETECTED NOT DETECTED Final   Giardia lamblia NOT DETECTED NOT DETECTED Final   Adenovirus F40/41 NOT DETECTED NOT DETECTED Final   Astrovirus NOT DETECTED NOT DETECTED Final   Norovirus GI/GII NOT DETECTED NOT DETECTED Final   Rotavirus A NOT DETECTED  NOT DETECTED Final   Sapovirus (I, II, IV, and V) NOT DETECTED NOT DETECTED Final    Comment: Performed at [facility], [address] [place], [place] [address]  C difficile quick scan w PCR reflex     Status: None   Collection Time: 03/30/18  7:23 PM  Result Value Ref Range Status   C Diff antigen NEGATIVE NEGATIVE Final   C Diff toxin NEGATIVE NEGATIVE Final   C Diff interpretation No C. difficile detected.  Final    Comment: Performed at [facility], [address] [place], [place] [address]    Coagulation Studies: No results for input(s): LABPROT, INR in the last 72 hours.  Imaging: No results found.  Medications:  I have reviewed the patient's current medications. Prior to Admission:  Medications Prior to Admission  Medication Sig Dispense Refill Last Dose  . acetaminophen-codeine (TYLENOL #3) 300-30 MG tablet Take 1 tablet by mouth every 6 (six) hours as needed for moderate pain or severe pain. Take 1 tablet every 6 to 8 hours as needed for pain.   prn at prn  . aspirin EC 81 MG tablet Take 1 tablet (81 mg total) by mouth daily. 30 tablet 2 Past Week at Unknown time  . baclofen (LIORESAL) 20 MG tablet Take 1 tablet (20 mg total) by mouth 2 (two) times daily as needed for muscle spasms. (Patient taking differently: Take 20 mg by mouth 3 (three) times daily. ) 30 each 0 03/29/2018 at Unknown time  . escitalopram (LEXAPRO) 20 MG tablet Take 20 mg by mouth daily.    03/30/2018 at Unknown time  . solifenacin (VESICARE) 5 MG tablet Take 5 mg by mouth daily.  11 03/30/2018 at Unknown time  .  spironolactone (ALDACTONE) 50 MG tablet Take 150-200 mg by mouth daily.    03/30/2018 at Unknown time  . [DISCONTINUED] ALPRAZolam (XANAX) 1 MG tablet Take 1 mg by mouth 3 (three) times daily.    03/30/2018 at Unknown time  . traMADol (ULTRAM) 50 MG tablet Take 1 tablet (50 mg total) by mouth every 6 (six) hours as needed for moderate pain (headache). (Patient not taking: Reported on 03/30/2018) 12 tablet 0 Completed Course at Unknown time   Scheduled: . ALPRAZolam  1 mg Oral BID  . atorvastatin  40 mg Oral q1800  . ciprofloxacin  500 mg Oral BID  . escitalopram  20 mg Oral Daily  . metroNIDAZOLE  500 mg Oral Q8H  . temazepam  15 mg Oral QHS    Assessment/Plan: Patient seems to be doing well and overall has shown improvement in mental status.  Again believe that mental status is metabolic in origin and will continue to improve over time although there may be some fluctuations noted depending on fatigue, etc.    No further neurologic intervention is recommended at this time.  If further questions arise, please call or page at that time.  Thank you for allowing neurology to participate in the care of this patient.  Thana Farr, MD Neurology [phone] 04/05/2018  1:06 PM    LOS: 6 days   04/05/2018  8:36 AM

## 2018-04-05 NOTE — Care Management Note (Addendum)
Case Management Note  Patient Details  Name: Donna Marsh MRN: [id number] Date of Birth: 07-Jul-1956  Subjective/Objective:   Met with patient and her spouse at bedside to discuss discharge planning. Both are telling me the confusion remains but is clearing. They are agreeable to home health and feel patient would benefit from a RN and PT short term. She has a walker at home. No other DME needs. PCP is Lavera Guise.Provided a list of home health agencies. Referral to [facility] for RN and PT. Discharging today by car.                 Action/Plan:   Expected Discharge Date:  04/05/18               Expected Discharge Plan:     In-House Referral:     Discharge planning Services  CM Consult  Post Acute Care Choice:  Home Health Choice offered to:  Patient, Spouse  DME Arranged:    DME Agency:     HH Arranged:  RN, PT [place] Agency:  [facility]  Status of Service:  Completed, signed off  If discussed at [employer], dates discussed:    Additional Comments:  Jolly Mango, RN 04/05/2018, 9:44 AM

## 2018-04-11 ENCOUNTER — Telehealth: Payer: Self-pay

## 2018-04-11 NOTE — Telephone Encounter (Signed)
Received message on 9/16 from patient's husband, Donna Marsh, with questions pertaining to a missed call from [facility] and an issue with the home health agency not contacting them to set up a visit.  Returned call this morning and spoke with Donna Marsh.  Explained the missed call from [facility] was an EMMI automated call and that they would attempt to reach patients up to 3 times if they do not get them the first try.  Based on EMMI report, it looks like patient did complete one of the attempts to reach her on 9/14.  As for home health, he explained they were able to work issue out by contacting her PCP.  [facility] was not able to accept patient's insurance so was referred to another agency. New agency has been in contact with patient and husband since.  I apologized for the delay in returning message due to being left on a staff member's voicemail that was out of the office for the week.  He was understanding and reports no further issues.  Informed him that they would receive one more automated call checking in on patient either today or tomorrow.

## 2018-06-11 ENCOUNTER — Encounter: Payer: Self-pay | Admitting: *Deleted

## 2018-06-12 ENCOUNTER — Ambulatory Visit: Payer: Medicare Other | Admitting: Anesthesiology

## 2018-06-12 ENCOUNTER — Encounter
Admission: RE | Disposition: A | Discharge status: Home / Self Care | Payer: Self-pay | Source: Ambulatory Visit | Attending: Internal Medicine

## 2018-06-12 ENCOUNTER — Encounter: Payer: Self-pay | Admitting: Student

## 2018-06-12 ENCOUNTER — Ambulatory Visit
Admission: RE | Admit: 2018-06-12 | Discharge: 2018-06-12 | Disposition: A | Discharge status: Home / Self Care | Payer: Medicare Other | Source: Ambulatory Visit | Attending: Internal Medicine | Admitting: Internal Medicine

## 2018-06-12 DIAGNOSIS — Z8719 Personal history of other diseases of the digestive system: Secondary | ICD-10-CM | POA: Diagnosis not present

## 2018-06-12 DIAGNOSIS — Z8673 Personal history of transient ischemic attack (TIA), and cerebral infarction without residual deficits: Secondary | ICD-10-CM | POA: Diagnosis not present

## 2018-06-12 DIAGNOSIS — Z7982 Long term (current) use of aspirin: Secondary | ICD-10-CM | POA: Diagnosis not present

## 2018-06-12 DIAGNOSIS — R933 Abnormal findings on diagnostic imaging of other parts of digestive tract: Secondary | ICD-10-CM | POA: Insufficient documentation

## 2018-06-12 DIAGNOSIS — F419 Anxiety disorder, unspecified: Secondary | ICD-10-CM | POA: Diagnosis not present

## 2018-06-12 DIAGNOSIS — K6289 Other specified diseases of anus and rectum: Secondary | ICD-10-CM | POA: Insufficient documentation

## 2018-06-12 DIAGNOSIS — F329 Major depressive disorder, single episode, unspecified: Secondary | ICD-10-CM | POA: Diagnosis not present

## 2018-06-12 DIAGNOSIS — Z96653 Presence of artificial knee joint, bilateral: Secondary | ICD-10-CM | POA: Diagnosis not present

## 2018-06-12 DIAGNOSIS — Z09 Encounter for follow-up examination after completed treatment for conditions other than malignant neoplasm: Secondary | ICD-10-CM | POA: Diagnosis not present

## 2018-06-12 DIAGNOSIS — Z79899 Other long term (current) drug therapy: Secondary | ICD-10-CM | POA: Diagnosis not present

## 2018-06-12 DIAGNOSIS — K591 Functional diarrhea: Secondary | ICD-10-CM | POA: Insufficient documentation

## 2018-06-12 HISTORY — PX: COLONOSCOPY WITH PROPOFOL: SHX5780

## 2018-06-12 HISTORY — DX: Cerebral infarction, unspecified: I63.9

## 2018-06-12 HISTORY — DX: Transient cerebral ischemic attack, unspecified: G45.9

## 2018-06-12 HISTORY — DX: Other chronic cystitis without hematuria: N30.20

## 2018-06-12 SURGERY — COLONOSCOPY WITH PROPOFOL
Anesthesia: General

## 2018-06-12 MED ORDER — PROPOFOL 500 MG/50ML IV EMUL
INTRAVENOUS | Status: AC
  Filled 2018-06-12: qty 50

## 2018-06-12 MED ORDER — PROPOFOL 10 MG/ML IV BOLUS
INTRAVENOUS | Status: DC | PRN
  Administered 2018-06-12: 20 mg via INTRAVENOUS
  Administered 2018-06-12: 30 mg via INTRAVENOUS
  Administered 2018-06-12: 20 mg via INTRAVENOUS
  Administered 2018-06-12: 50 mg via INTRAVENOUS
  Administered 2018-06-12: 30 mg via INTRAVENOUS
  Administered 2018-06-12: 20 mg via INTRAVENOUS

## 2018-06-12 MED ORDER — PROPOFOL 500 MG/50ML IV EMUL
INTRAVENOUS | Status: DC | PRN
  Administered 2018-06-12: 80 ug/kg/min via INTRAVENOUS

## 2018-06-12 MED ORDER — LIDOCAINE HCL (PF) 1 % IJ SOLN
INTRAMUSCULAR | Status: AC
  Administered 2018-06-12: 0.3 mL via INTRADERMAL
  Filled 2018-06-12: qty 2

## 2018-06-12 MED ORDER — SODIUM CHLORIDE 0.9 % IV SOLN
INTRAVENOUS | Status: DC
  Administered 2018-06-12: 13:00:00 via INTRAVENOUS
  Administered 2018-06-12: 1000 mL via INTRAVENOUS

## 2018-06-12 MED ORDER — LIDOCAINE HCL (PF) 1 % IJ SOLN
2.0000 mL | Freq: Once | INTRAMUSCULAR | Status: AC
  Administered 2018-06-12: 0.3 mL via INTRADERMAL

## 2018-06-12 NOTE — Op Note (Addendum)
[facility] Patient Name: Donna Marsh Procedure Date: 06/12/2018 1:00 PM MRN: [id number] Account #: [id number] Date of Birth: [birth year] Admit Type: Outpatient Age: 62 Room: [facility] ENDO ROOM 2 Gender: Female Note Status: Finalized Procedure:            Colonoscopy Indications:          Functional diarrhea Providers:            Boykin Nearing. Norma Fredrickson MD, MD Referring MD:         Dortha Kern (Referring MD) Medicines:            Propofol per Anesthesia Complications:        No immediate complications. Procedure:            Pre-Anesthesia Assessment:                       - The risks and benefits of the procedure and the                        sedation options and risks were discussed with the                        patient. All questions were answered and informed                        consent was obtained.                       - Patient identification and proposed procedure were                        verified prior to the procedure by the nurse. The                        procedure was verified in the procedure room.                       - ASA Grade Assessment: III - A patient with severe                        systemic disease.                       - After reviewing the risks and benefits, the patient                        was deemed in satisfactory condition to undergo the                        procedure.                       After obtaining informed consent, the colonoscope was                        passed under direct vision. Throughout the procedure,                        the patient's blood pressure, pulse, and oxygen  saturations were monitored continuously. The                        Colonoscope was introduced through the anus and                        advanced to the the cecum, identified by appendiceal                        orifice and ileocecal valve. The colonoscopy was   performed without difficulty. The patient tolerated the                        procedure well. The quality of the bowel preparation                        was excellent. The ileocecal valve, appendiceal                        orifice, and rectum were photographed. Findings:      The perianal and digital rectal examinations were normal. Pertinent       negatives include normal sphincter tone and no palpable rectal lesions.      A circumferential scar was found in the rectum. Suggesting colo-colo       anastomosis.      The exam was otherwise without abnormality.      Biopsies were taken with a cold forceps in the rectum, in the descending       colon and in the ascending colon for histology.      The exam was otherwise without abnormality on direct and retroflexion       views. Impression:           - Scar in the rectum.                       - The examination was otherwise normal.                       - The examination was otherwise normal on direct and                        retroflexion views.                       - Biopsies were taken with a cold forceps for histology                        in the rectum, in the descending colon and in the                        ascending colon. Recommendation:       - Patient has a contact number available for                        emergencies. The signs and symptoms of potential                        delayed complications were discussed with the patient.                        Return  to normal activities tomorrow. Written discharge                        instructions were provided to the patient.                       - Resume previous diet.                       - Continue present medications.                       - Await pathology results.                       - Repeat colonoscopy in 10 years for screening purposes.                       - Return to GI office PRN.                       - The findings and recommendations were discussed with                         the patient and their spouse. Procedure Code(s):    --- Professional ---                       [phone], Colonoscopy, flexible; with biopsy, single or                        multiple Diagnosis Code(s):    --- Professional ---                       K59.1, Functional diarrhea                       K62.89, Other specified diseases of anus and rectum CPT copyright 2018 [employer]. All rights reserved. The codes documented in this report are preliminary and upon coder review may  be revised to meet current compliance requirements. Stanton Kidney MD, MD 06/12/2018 1:27:07 PM This report has been signed electronically. Number of Addenda: 0 Note Initiated On: 06/12/2018 1:00 PM Scope Withdrawal Time: 0 hours 5 minutes 13 seconds  Total Procedure Duration: 0 hours 14 minutes 44 seconds       [facility]

## 2018-06-12 NOTE — Transfer of Care (Signed)
Immediate Anesthesia Transfer of Care Note  Patient: Donna Marsh  Procedure(s) Performed: COLONOSCOPY WITH PROPOFOL (N/A )  Patient Location: PACU  Anesthesia Type:General  Level of Consciousness: awake  Airway & Oxygen Therapy: Patient Spontanous Breathing  Post-op Assessment: Report given to RN  Post vital signs: stable  Last Vitals:  Vitals Value Taken Time  BP 107/68 06/12/2018  1:23 PM  Temp 36.1 C 06/12/2018  1:23 PM  Pulse 74 06/12/2018  1:23 PM  Resp 19 06/12/2018  1:23 PM  SpO2 98 % 06/12/2018  1:23 PM    Last Pain:  Vitals:   06/12/18 1323  TempSrc: Tympanic  PainSc: 0-No pain         Complications: No apparent anesthesia complications

## 2018-06-12 NOTE — Interval H&P Note (Signed)
History and Physical Interval Note:  06/12/2018 12:26 PM  Donna Marsh  has presented today for surgery, with the diagnosis of ABNORMAL CT DIARRHEA  The various methods of treatment have been discussed with the patient and family. After consideration of risks, benefits and other options for treatment, the patient has consented to  Procedure(s): COLONOSCOPY WITH PROPOFOL (N/A) as a surgical intervention .  The patient's history has been reviewed, patient examined, no change in status, stable for surgery.  I have reviewed the patient's chart and labs.  Questions were answered to the patient's satisfaction.     [place], [place]

## 2018-06-12 NOTE — Anesthesia Preprocedure Evaluation (Addendum)
Anesthesia Evaluation  Patient identified by MRN, date of birth, ID band Patient awake    Reviewed: Allergy & Precautions, H&P , NPO status , Patient's Chart, lab work & pertinent test results  History of Anesthesia Complications (+) PONV and history of anesthetic complications  Airway Mallampati: II       Dental  (+) Poor Dentition   Pulmonary neg pulmonary ROS,           Cardiovascular + angina      Neuro/Psych  Headaches, PSYCHIATRIC DISORDERS Anxiety Depression TIACVA (>1 year ago), No Residual Symptoms    GI/Hepatic Neg liver ROS, GERD  ,  Endo/Other  negative endocrine ROS  Renal/GU negative Renal ROS  negative genitourinary   Musculoskeletal  (+) Arthritis ,   Abdominal   Peds  Hematology  (+) Blood dyscrasia, anemia ,   Anesthesia Other Findings Past Medical History: No date: Alopecia universalis No date: Anemia     Comment:  in 1979 No date: Anginal pain (HCC)     Comment:  hx of CP in 2013, went to the hospital, everything was               negative No date: Anxiety No date: Arthritis     Comment:  RA AND OA .   1979: Blood transfusion     Comment:  WITH PREGNANCY No date: Chronic cystitis 04/02/2018: Colitis No date: Depression No date: Edema     Comment:  HX OF SWELLING OF FEET, LEGS, HANDS AND FACE--TAKES               TRIAMTERENE/HCTZ FOR PREVENTION--DOES NOT HAVE               HYPERTENSION AND HAS BEEN TOLD B/P USUALLY LOW No date: GERD (gastroesophageal reflux disease)     Comment:  OCCAS--OTC MED IF ANYTHING NEEDED No date: Headache(784.0)     Comment:  HX OF MIGRAINES No date: Hypercholesterolemia No date: PONV (postoperative nausea and vomiting) No date: Stroke (HCC) No date: TIA (transient ischemic attack) No date: URI (upper respiratory infection)     Comment:  URI IN OCT 2012--PT FEELS CLEAR NOW--DOES NOT USUALLY               HAVE ANY PROBLEMS WITH URI No date: UTI (lower  urinary tract infection)     Comment:  FREQUENTLY--JUST FINISHED ANTIBIOTIC SAT 06/25/11 FOR UTI No date: Varicose veins     Comment:  LEFT LEG  Past Surgical History: No date: ABDOMINAL HYSTERECTOMY 2014: BACK SURGERY     Comment:  [place] 2011: CHOLECYSTECTOMY JAN 2006: JOINT REPLACEMENT     Comment:  LEFT TOTAL KNEE ARTHROPLASTY  A JAN 2012: JOINT REPLACEMENT     Comment:  RIGHT TOTAL KNEE ARTHROPLASTY 07/04/2011: TOTAL KNEE REVISION     Comment:  Procedure: bilateral TOTAL KNEE REVISION;  Surgeon:               Shelda Pal;  Location: [facility];  Service: Orthopedics;              Laterality: Right;  Right Total Knee Revision with Scar               Debridement/Patella Revision with Poly Exchange  BMI    Body Mass Index:  27.26 kg/m      Reproductive/Obstetrics negative OB ROS  Anesthesia Physical Anesthesia Plan  ASA: III  Anesthesia Plan: General   Post-op Pain Management:    Induction:   PONV Risk Score and Plan: Propofol infusion and TIVA  Airway Management Planned: Natural Airway and Nasal Cannula  Additional Equipment:   Intra-op Plan:   Post-operative Plan:   Informed Consent: I have reviewed the patients History and Physical, chart, labs and discussed the procedure including the risks, benefits and alternatives for the proposed anesthesia with the patient or authorized representative who has indicated his/her understanding and acceptance.   Dental Advisory Given  Plan Discussed with: Anesthesiologist  Anesthesia Plan Comments:        Anesthesia Quick Evaluation

## 2018-06-12 NOTE — Anesthesia Post-op Follow-up Note (Signed)
Anesthesia QCDR form completed.        

## 2018-06-12 NOTE — H&P (Signed)
Outpatient short stay form Pre-procedure 06/12/2018 11:22 AM Donna Marsh, M.D.  Primary Physician: Joen Laura  Reason for visit:  Follow up colitis, diarrhea, abnormal CT scan of Gastrointestinal tract.  History of present illness:  As above. Patient c/o 3-5 loose stools daily without blood. Underwent course of Cipro/Flagyl in the hospital during admission in Sep 2019.    No current facility-administered medications for this encounter.   Current Outpatient Medications:  .  acetaminophen-codeine (TYLENOL #3) 300-30 MG tablet, Take 1 tablet by mouth every 6 (six) hours as needed for moderate pain or severe pain. Take 1 tablet every 6 to 8 hours as needed for pain., Disp: , Rfl:  .  ALPRAZolam (XANAX) 1 MG tablet, Take 1 tablet (1 mg total) by mouth 2 (two) times daily., Disp: 30 tablet, Rfl: 0 .  aspirin EC 81 MG tablet, Take 1 tablet (81 mg total) by mouth daily., Disp: 30 tablet, Rfl: 2 .  baclofen (LIORESAL) 20 MG tablet, Take 1 tablet (20 mg total) by mouth 2 (two) times daily as needed for muscle spasms. (Patient taking differently: Take 20 mg by mouth 3 (three) times daily. ), Disp: 30 each, Rfl: 0 .  escitalopram (LEXAPRO) 20 MG tablet, Take 20 mg by mouth daily. , Disp: , Rfl:  .  solifenacin (VESICARE) 5 MG tablet, Take 5 mg by mouth daily., Disp: , Rfl: 11 .  spironolactone (ALDACTONE) 50 MG tablet, Take 150-200 mg by mouth daily. , Disp: , Rfl:  .  traMADol (ULTRAM) 50 MG tablet, Take 1 tablet (50 mg total) by mouth every 6 (six) hours as needed for moderate pain (headache). (Patient not taking: Reported on 03/30/2018), Disp: 12 tablet, Rfl: 0  No medications prior to admission.     No Known Allergies   Past Medical History:  Diagnosis Date  . Alopecia universalis   . Anemia    in 1979  . Anginal pain (HCC)    hx of CP in 2013, went to the hospital, everything was negative  . Anxiety   . Arthritis    RA AND OA .    Marland Kitchen Blood transfusion 1979   WITH PREGNANCY  .  Chronic cystitis   . Colitis 04/02/2018  . Depression   . Edema    HX OF SWELLING OF FEET, LEGS, HANDS AND FACE--TAKES TRIAMTERENE/HCTZ FOR PREVENTION--DOES NOT HAVE HYPERTENSION AND HAS BEEN TOLD B/P USUALLY LOW  . GERD (gastroesophageal reflux disease)    OCCAS--OTC MED IF ANYTHING NEEDED  . Headache(784.0)    HX OF MIGRAINES  . Hypercholesterolemia   . PONV (postoperative nausea and vomiting)   . Stroke (HCC)   . TIA (transient ischemic attack)   . URI (upper respiratory infection)    URI IN OCT 2012--PT FEELS CLEAR NOW--DOES NOT USUALLY HAVE ANY PROBLEMS WITH URI  . UTI (lower urinary tract infection)    FREQUENTLY--JUST FINISHED ANTIBIOTIC SAT 06/25/11 FOR UTI  . Varicose veins    LEFT LEG    Review of systems:  Otherwise negative.    Physical Exam  Gen: Alert, oriented. Appears stated age.  HEENT: [place]/AT. PERRLA. Lungs: CTA, no wheezes. CV: RR nl S1, S2. Abd: soft, benign, no masses. BS+ Ext: No edema. Pulses 2+    Planned procedures: Proceed with colonoscopy. The patient understands the nature of the planned procedure, indications, risks, alternatives and potential complications including but not limited to bleeding, infection, perforation, damage to internal organs and possible oversedation/side effects from anesthesia. The patient agrees and  gives consent to proceed.  Please refer to procedure notes for findings, recommendations and patient disposition/instructions.     Fartun Paradiso K. Norma Marsh, M.D. Gastroenterology 06/12/2018  11:22 AM

## 2018-06-13 LAB — SURGICAL PATHOLOGY

## 2018-06-13 NOTE — Anesthesia Postprocedure Evaluation (Signed)
Anesthesia Post Note  Patient: Donna Marsh  Procedure(s) Performed: COLONOSCOPY WITH PROPOFOL (N/A )  Patient location during evaluation: Endoscopy Anesthesia Type: General Level of consciousness: awake and alert Pain management: pain level controlled Vital Signs Assessment: post-procedure vital signs reviewed and stable Respiratory status: spontaneous breathing, nonlabored ventilation, respiratory function stable and patient connected to nasal cannula oxygen Cardiovascular status: blood pressure returned to baseline and stable Postop Assessment: no apparent nausea or vomiting Anesthetic complications: no     Last Vitals:  Vitals:   06/12/18 1333 06/12/18 1343  BP: 90/66 106/65  Pulse: 69 65  Resp: 11 13  Temp:    SpO2: 99% 100%    Last Pain:  Vitals:   06/13/18 0733  TempSrc:   PainSc: 0-No pain                 Cleda Mccreedy Piscitello

## 2018-06-14 ENCOUNTER — Encounter: Payer: Self-pay | Admitting: Internal Medicine

## 2019-01-22 ENCOUNTER — Telehealth: Payer: Self-pay | Admitting: *Deleted

## 2019-01-22 ENCOUNTER — Other Ambulatory Visit: Payer: Self-pay

## 2019-01-22 DIAGNOSIS — Z20822 Contact with and (suspected) exposure to covid-19: Secondary | ICD-10-CM

## 2019-01-22 NOTE — Telephone Encounter (Signed)
[facility]- requesting COVID testing  Exposure and symptomatic

## 2019-01-29 ENCOUNTER — Telehealth: Payer: Self-pay

## 2019-01-29 NOTE — Telephone Encounter (Signed)
Pt. Checking on COVID 19 results - not available yet. 

## 2019-01-30 ENCOUNTER — Telehealth: Payer: Self-pay | Admitting: General Practice

## 2019-01-30 LAB — NOVEL CORONAVIRUS, NAA: SARS-CoV-2, NAA: NOT DETECTED

## 2019-01-30 NOTE — Telephone Encounter (Signed)
Pt called in and gave them Neg results , pt expressed understanding

## 2019-03-18 ENCOUNTER — Encounter: Payer: Self-pay | Admitting: Nurse Practitioner

## 2019-03-18 ENCOUNTER — Other Ambulatory Visit: Payer: Self-pay

## 2019-03-18 ENCOUNTER — Ambulatory Visit (INDEPENDENT_AMBULATORY_CARE_PROVIDER_SITE_OTHER): Payer: Medicare Other | Admitting: Nurse Practitioner

## 2019-03-18 VITALS — Ht 62.0 in | Wt 137.0 lb

## 2019-03-18 DIAGNOSIS — M545 Low back pain: Secondary | ICD-10-CM

## 2019-03-18 DIAGNOSIS — Z7689 Persons encountering health services in other specified circumstances: Secondary | ICD-10-CM | POA: Diagnosis not present

## 2019-03-18 DIAGNOSIS — F419 Anxiety disorder, unspecified: Secondary | ICD-10-CM | POA: Diagnosis not present

## 2019-03-18 DIAGNOSIS — F329 Major depressive disorder, single episode, unspecified: Secondary | ICD-10-CM

## 2019-03-18 DIAGNOSIS — G8929 Other chronic pain: Secondary | ICD-10-CM

## 2019-03-18 DIAGNOSIS — F32A Depression, unspecified: Secondary | ICD-10-CM

## 2019-03-18 MED ORDER — ALPRAZOLAM 1 MG PO TABS: 1.0000 mg | ORAL_TABLET | Freq: Two times a day (BID) | ORAL | 1 refills | Status: AC

## 2019-03-18 MED ORDER — DULOXETINE HCL 20 MG PO CPEP: 20.0000 mg | ORAL_CAPSULE | Freq: Every day | ORAL | 1 refills | Status: DC

## 2019-03-18 NOTE — Progress Notes (Signed)
Subjective:    Patient ID: Donna Marsh, female    DOB: 1956/07/06, 63 y.o.   MRN: [id number]  Donna Marsh is a 63 y.o. female presenting on 03/18/2019 for Establish Care (depression, anxiety)  HPI Establish Care New Provider Pt last seen by PCP Dr. Bernie Covey approximately 4 months ago.  Obtain records.    Depression and Anxiety Currently on 20 mg escitalopram and alprazolam 1 mg bid (previously prescribed by Dr. Bernie Covey).   - Doesn't feel like escitalpram is helping any more.   - Patient ist taking alprazolam 2 mg bid.  Previously, patient took 3 mg per day.  Takes one in am and one at bedtime. Has been asked to reduce alprazolam, stop either benzo or opioid. - Takes Tylenol#3 in evening - needs new prescriber (previously Dr. Clemmie Krill)  Things going "not so well" currently.  Anxiety,  Constantly feeling anxious.  Mind racing, physically tense,( toes curled).  Cannot relax.  Tries to relax and cannot stop racing thoughts.  Feels herself tense in bed, can mentally create relaxation, but is not able to maintain.  Bladder leakage Recurrent kidney infections, bladder incontinence, s/p "kidney tuck." Overactive bladder. Is seen at [facility] in [place] - is having Urgent PC treatments done now and is on 10 of 12 treatments tomorrow.  Chronic Pain Patient has been through procedural pain management in [place] in past many years ago.  Occasionally injections helped, sometimes not.   Patient has been managed on Tylenol #3 for several years.  Patient is not willing to stop this therapy at this time.  Depression screen PHQ 2/9 03/18/2019  Decreased Interest 1  Down, Depressed, Hopeless 1  PHQ - 2 Score 2  Altered sleeping 1  Tired, decreased energy 3  Change in appetite 0  Feeling bad or failure about yourself  0  Trouble concentrating 1  Moving slowly or fidgety/restless 1  Suicidal thoughts 0  PHQ-9 Score 8  Difficult doing work/chores Not difficult  at all    GAD 7 : Generalized Anxiety Score 03/18/2019  Nervous, Anxious, on Edge 1  Control/stop worrying 0  Worry too much - different things 3  Trouble relaxing 3  Restless 0  Easily annoyed or irritable 1  Afraid - awful might happen 1  Total GAD 7 Score 9  Anxiety Difficulty Somewhat difficult    Past Medical History:  Diagnosis Date  . Alopecia universalis   . Anemia    in 1979  . Anginal pain ([place])    hx of CP in 2013, went to the hospital, everything was negative  . Anxiety   . Arthritis    RA AND OA .    Marland Kitchen Blood transfusion 1979   WITH PREGNANCY  . Chronic cystitis   . Colitis 04/02/2018  . Depression   . Edema    HX OF SWELLING OF FEET, LEGS, HANDS AND FACE--TAKES TRIAMTERENE/HCTZ FOR PREVENTION--DOES NOT HAVE HYPERTENSION AND HAS BEEN TOLD B/P USUALLY LOW  . GERD (gastroesophageal reflux disease)    OCCAS--OTC MED IF ANYTHING NEEDED  . Headache(784.0)    HX OF MIGRAINES  . Hypercholesterolemia   . PONV (postoperative nausea and vomiting)   . Stroke ([place])   . TIA (transient ischemic attack)   . URI (upper respiratory infection)    URI IN OCT 2012--PT FEELS CLEAR NOW--DOES NOT USUALLY HAVE ANY PROBLEMS WITH URI  . UTI (lower urinary tract infection)    FREQUENTLY--JUST FINISHED ANTIBIOTIC SAT 06/25/11 FOR UTI  .  Varicose veins    LEFT LEG   Past Surgical History:  Procedure Laterality Date  . ABDOMINAL HYSTERECTOMY    . BACK SURGERY  2014   D[place]ki  . CHOLECYSTECTOMY  2011  . COLONOSCOPY WITH PROPOFOL N/A 06/12/2018   Procedure: COLONOSCOPY WITH PROPOFOL;  Surgeon: Toledo, Boykin Nearingeodoro K, MD;  Location: [facility];  Service: Gastroenterology;  Laterality: N/A;  . JOINT REPLACEMENT  JAN 2006   LEFT TOTAL KNEE ARTHROPLASTY  A  . JOINT REPLACEMENT  JAN 2012   RIGHT TOTAL KNEE ARTHROPLASTY  . TOTAL KNEE REVISION  07/04/2011   Procedure: bilateral TOTAL KNEE REVISION;  Surgeon: Shelda PalMatthew D Olin;  Location: [facility];  Service: Orthopedics;  Laterality: Right;   Right Total Knee Revision with Scar Debridement/Patella Revision with Poly Exchange   Social History   Socioeconomic History  . Marital status: Married    Spouse name: Not on file  . Number of children: Not on file  . Years of education: Not on file  . Highest education level: Not on file  Occupational History  . Not on file  Social Needs  . Financial resource strain: Not on file  . Food insecurity    Worry: Not on file    Inability: Not on file  . Transportation needs    Medical: Not on file    Non-medical: Not on file  Tobacco Use  . Smoking status: Never Smoker  . Smokeless tobacco: Never Used  Substance and Sexual Activity  . Alcohol use: Yes    Comment: RARELY  . Drug use: No  . Sexual activity: Yes  Lifestyle  . Physical activity    Days per week: Not on file    Minutes per session: Not on file  . Stress: Not on file  Relationships  . Social Musicianconnections    Talks on phone: Not on file    Gets together: Not on file    Attends religious service: Not on file    Active member of club or organization: Not on file    Attends meetings of clubs or organizations: Not on file    Relationship status: Not on file  . Intimate partner violence    Fear of current or ex partner: Not on file    Emotionally abused: Not on file    Physically abused: Not on file    Forced sexual activity: Not on file  Other Topics Concern  . Not on file  Social History Narrative  . Not on file   Family History  Problem Relation Age of Onset  . Breast cancer Mother   . CAD Father   . Stroke Paternal Grandfather    Current Outpatient Medications on File Prior to Visit  Medication Sig  . acetaminophen-codeine (TYLENOL #3) 300-30 MG tablet Take 1 tablet by mouth every 6 (six) hours as needed for moderate pain or severe pain. Take 1 tablet every 6 to 8 hours as needed for pain.  Marland Kitchen. ALPRAZolam (XANAX) 1 MG tablet Take 1 tablet (1 mg total) by mouth 2 (two) times daily.  Marland Kitchen. aspirin EC 81 MG  tablet Take 1 tablet (81 mg total) by mouth daily.  . baclofen (LIORESAL) 20 MG tablet Take 1 tablet (20 mg total) by mouth 2 (two) times daily as needed for muscle spasms. (Patient taking differently: Take 20 mg by mouth 3 (three) times daily. )  . Biotin 1 MG CAPS Take by mouth.  . escitalopram (LEXAPRO) 20 MG tablet Take 20 mg by mouth  daily.   . solifenacin (VESICARE) 5 MG tablet Take 5 mg by mouth daily.  Marland Kitchen spironolactone (ALDACTONE) 50 MG tablet Take 150-200 mg by mouth daily.    No current facility-administered medications on file prior to visit.     Review of Systems  Constitutional: Negative for chills and fever.  HENT: Negative for congestion and sore throat.   Eyes: Negative for pain.  Respiratory: Negative for cough, shortness of breath and wheezing.   Cardiovascular: Negative for chest pain, palpitations and leg swelling.  Gastrointestinal: Negative for abdominal pain, blood in stool, constipation, diarrhea, nausea and vomiting.  Endocrine: Negative for polydipsia.  Genitourinary: Negative for dysuria, frequency, hematuria and urgency.  Musculoskeletal: Positive for arthralgias and back pain. Negative for myalgias and neck pain.  Skin: Negative.  Negative for rash.  Allergic/Immunologic: Negative for environmental allergies.  Neurological: Negative for dizziness, weakness and headaches.  Hematological: Does not bruise/bleed easily.  Psychiatric/Behavioral: Positive for sleep disturbance. Negative for dysphoric mood and suicidal ideas. The patient is nervous/anxious.    Per HPI unless specifically indicated above     Objective:    Ht 5\' 2"  (1.575 m)   Wt 137 lb (62.1 kg)   BMI 25.06 kg/m   Wt Readings from Last 3 Encounters:  03/18/19 137 lb (62.1 kg)  06/12/18 153 lb 14.1 oz (69.8 kg)  03/30/18 153 lb 14.4 oz (69.8 kg)    Physical Exam Vitals signs reviewed.  Constitutional:      General: She is not in acute distress.    Appearance: She is well-developed.   HENT:     Head: Normocephalic and atraumatic.  Cardiovascular:     Rate and Rhythm: Normal rate and regular rhythm.     Pulses:          Radial pulses are 2+ on the right side and 2+ on the left side.       Posterior tibial pulses are 1+ on the right side and 1+ on the left side.     Heart sounds: Normal heart sounds, S1 normal and S2 normal.  Pulmonary:     Effort: Pulmonary effort is normal. No respiratory distress.     Breath sounds: Normal breath sounds and air entry.  Musculoskeletal:     Right lower leg: No edema.     Left lower leg: No edema.  Skin:    General: Skin is warm and dry.     Capillary Refill: Capillary refill takes less than 2 seconds.  Neurological:     Mental Status: She is alert and oriented to person, place, and time.  Psychiatric:        Attention and Perception: Attention and perception normal.        Mood and Affect: Affect normal. Mood is anxious.        Behavior: Behavior normal. Behavior is cooperative.        Thought Content: Thought content normal. Thought content does not include homicidal or suicidal ideation. Thought content does not include homicidal or suicidal plan.        Cognition and Memory: Cognition and memory normal.        Judgment: Judgment normal.    Results for orders placed or performed in visit on 01/22/19  Novel Coronavirus, NAA ([facility])  Result Value Ref Range   SARS-CoV-2, NAA Not Detected Not Detected      Assessment & Plan:   Problem List Items Addressed This Visit    None    Visit Diagnoses  Anxiety and depression    -  Primary Currently uncontrolled.  Anxiety overwhelming and no longer improved as well with alprazolam  Plan: 1. High dose alprazolam - discussed with patient need to reduce dose in future.  Provided schedule in AVS.  Will have reduced pill count each visit.  May not be able to eliminate bedtime dose, but should try 2. START duloxetine 20 mg once daily for anxiety and pain management. 3. Reviewed  non-pharm treatment options 4. follow-up 6 weeks   Relevant Medications   DULoxetine (CYMBALTA) 20 MG capsule   ALPRAZolam (XANAX) 1 MG tablet   Chronic midline low back pain without sciatica     Chronic, stable.  Needs new opioid prescriber.  Discussed with patient we will not be prescriber of chronic opioids  Also discussed need to reduce / eliminate opioid and/or benzo in future. - Pain management referral placed - Start duloxetine - Follow-up prn.   Relevant Orders   Ambulatory referral to Pain Clinic   Encounter to establish care     Previous PCP was at Dr. Clayborn Bigness.  Records will be requested.  Past medical, family, and surgical history reviewed w/ pt.        Meds ordered this encounter  Medications  . DULoxetine (CYMBALTA) 20 MG capsule    Sig: Take 1 capsule (20 mg total) by mouth daily.    Dispense:  90 capsule    Refill:  1    Order Specific Question:   Supervising Provider    Answer:   Smitty Cords [[id number]]  . ALPRAZolam (XANAX) 1 MG tablet    Sig: Take 1 tablet (1 mg total) by mouth 2 (two) times daily.    Dispense:  55 tablet    Refill:  1    Order Specific Question:   Supervising Provider    Answer:   Smitty Cords [[id number]]   Follow up plan: Return in about 6 weeks (around 04/29/2019) for anxiety, depression.  Wilhelmina Mcardle, DNP, AGPCNP-BC Adult Gerontology Primary Care Nurse Practitioner [facility] 03/18/2019, 2:19 PM

## 2019-03-18 NOTE — Patient Instructions (Addendum)
Donna Marsh,   Thank you for coming in to clinic today.  1. Referral to Pain Management for Tylenol#3 and other pain management discussion options.  2. For anxiety and depression - Continue alprazolam without change for next 3-4 weeks while duloxetine is building up to help you.  You will reduce by 0.5 mg two mornings per week.    1 mg MWFSat Sun 0.5 mg Tu, Th  1 mg MFSun 0.5 mg Tu, Wed, Th, Sat  Repeat with 0.5 mg and 0 mg until every morning has no need for alprazolam dose.  Then, we may consider night reduction.    3. START duloxetine 20 mg once daily for anxiety and pain control. - take with escitalopram and monitor for any side effects that are new.  Call if you have any questions about this.  Please schedule a follow-up appointment with Wilhelmina Mcardle, AGNP. Return in about 6 weeks (around 04/29/2019) for anxiety, depression.  If you have any other questions or concerns, please feel free to call the clinic or send a message through MyChart. You may also schedule an earlier appointment if necessary.  You will receive a survey after today's visit either digitally by e-mail or paper by [employer]. Your experiences and feedback matter to [place].  Please respond so we know how we are doing as we provide care for you.   Wilhelmina Mcardle, DNP, AGNP-BC Adult Gerontology Nurse Practitioner [facility]   Serotonin Syndrome Serotonin is a chemical in your body (neurotransmitter) that helps to control several functions, such as:  Brain and nerve cell function.  Mood and emotions.  Memory.  Eating.  Sleeping.  Sexual activity.  Stress response. Having too much serotonin in your body can cause serotonin syndrome. This condition can be harmful to your brain and nerve cells. This can be a life-threatening condition. What are the causes? This condition may be caused by taking medicines or drugs that increase the level of serotonin in your body, such as:   Antidepressant medicines.  Migraine medicines.  Certain pain medicines.  Certain drugs, including ecstasy, LSD, cocaine, and amphetamines.  Over-the-counter cough or cold medicines that contain dextromethorphan.  Certain herbal supplements, including St. John's wort, ginseng, and nutmeg. This condition usually occurs when you take these medicines or drugs in combination, but it can also happen with a high dose of a single medicine or drug. What increases the risk? You are more likely to develop this condition if:  You just started taking a medicine or drug that increases the level of serotonin in the body.  You recently increased the dose of a medicine or drug that increases the level of serotonin in the body.  You take more than one medicine or drug that increases the level of serotonin in the body. What are the signs or symptoms? Symptoms of this condition usually start within several hours of taking a medicine or drug. Symptoms may be mild or severe. Mild symptoms include:  Sweating.  Restlessness or agitation.  Muscle twitching or stiffness.  Rapid heart rate.  Nausea and vomiting.  Diarrhea.  Headache.  Shivering or goose bumps.  Confusion. Severe symptoms include:  Irregular heartbeat.  Seizures.  Loss of consciousness.  High fever. How is this diagnosed? This condition may be diagnosed based on:  Your medical history.  A physical exam.  Your prior use of drugs and medicines.  Blood or urine tests. These may be used to rule out other causes of your  symptoms. How is this treated? The treatment for this condition depends on the severity of your symptoms.  For mild cases, stopping the medicine or drug that caused your condition is usually all that is needed.  For moderate to severe cases, treatment in a hospital may be needed to prevent or manage life-threatening symptoms. This may include medicines to control your symptoms, IV fluids,  interventions to support your breathing, and treatments to control your body temperature. Follow these instructions at home: Medicines   Take over-the-counter and prescription medicines only as told by your health care provider. This is important.  Check with your health care provider before you start taking any new prescriptions, over-the-counter medicines, herbs, or supplements.  Avoid combining any medicines that can cause this condition to occur. Lifestyle   Maintain a healthy lifestyle. ? Eat a healthy diet that includes plenty of vegetables, fruits, whole grains, low-fat dairy products, and lean protein. Do not eat a lot of foods that are high in fat, added sugars, or salt. ? Get the right amount and quality of sleep. Most adults need 7-9 hours of sleep each night. ? Make time to exercise, even if it is only for short periods of time. Most adults should exercise for at least 150 minutes each week. ? Do not drink alcohol. ? Do not use illegal drugs, and do not take medicines for reasons other than they are prescribed. General instructions  Do not use any products that contain nicotine or tobacco, such as cigarettes and e-cigarettes. If you need help quitting, ask your health care provider.  Keep all follow-up visits as told by your health care provider. This is important. Contact a health care provider if:  Your symptoms do not improve or they get worse. Get help right away if you:  Have worsening confusion, severe headache, chest pain, high fever, seizures, or loss of consciousness.  Experience serious side effects of medicine, such as swelling of your face, lips, tongue, or throat.  Have serious thoughts about hurting yourself or others. These symptoms may represent a serious problem that is an emergency. Do not wait to see if the symptoms will go away. Get medical help right away. Call your local emergency services (911 in the [place] Do not drive yourself to the hospital.  If you ever feel like you may hurt yourself or others, or have thoughts about taking your own life, get help right away. You can go to your nearest emergency department or call:  Your local emergency services (911 in the [place]  A suicide crisis helpline, such as the [place] at (763) 167-4512. This is open 24 hours a day. Summary  Serotonin is a brain chemical that helps to regulate the nervous system. High levels of serotonin in the body can cause serotonin syndrome, which is a very dangerous condition.  This condition may be caused by taking medicines or drugs that increase the level of serotonin in your body.  Treatment depends on the severity of your symptoms. For mild cases, stopping the medicine or drug that caused your condition is usually all that is needed.  Check with your health care provider before you start taking any new prescriptions, over-the-counter medicines, herbs, or supplements. This information is not intended to replace advice given to you by your health care provider. Make sure you discuss any questions you have with your health care provider. Document Released: 08/18/2004 Document Revised: 08/18/2017 Document Reviewed: 08/18/2017 Elsevier Patient Education  2020 [employer].

## 2019-03-24 ENCOUNTER — Encounter: Payer: Self-pay | Admitting: Nurse Practitioner

## 2019-04-29 ENCOUNTER — Ambulatory Visit: Payer: Medicare Other | Admitting: Nurse Practitioner

## 2019-09-29 ENCOUNTER — Ambulatory Visit: Payer: Medicare Other | Attending: Internal Medicine

## 2019-09-29 DIAGNOSIS — Z23 Encounter for immunization: Secondary | ICD-10-CM

## 2019-09-29 NOTE — Progress Notes (Signed)
   Covid-19 Vaccination Clinic  Name:  Donna Marsh    MRN: [id number] DOB: [birth year]  09/29/2019  [name] was observed post Covid-19 immunization for 15 minutes without incident. She was provided with Vaccine Information Sheet and instruction to access the V-Safe system.   [name] was instructed to call 911 with any severe reactions post vaccine: Marland Kitchen Difficulty breathing  . Swelling of face and throat  . A fast heartbeat  . A bad rash all over body  . Dizziness and weakness   Immunizations Administered    Name Date Dose VIS Date Route   Pfizer COVID-19 Vaccine 09/29/2019 12:27 PM 0.3 mL 07/05/2019 Intramuscular   Manufacturer: [employer], [employer]   Lot: [id number]   NDC: [address]-1

## 2019-10-22 ENCOUNTER — Ambulatory Visit: Payer: Medicare Other | Attending: Internal Medicine

## 2019-10-22 DIAGNOSIS — Z23 Encounter for immunization: Secondary | ICD-10-CM

## 2019-10-22 NOTE — Progress Notes (Signed)
   Covid-19 Vaccination Clinic  Name:  Donna Marsh    MRN: [id number] DOB: Jul 16, 1956  10/22/2019  [name] was observed post Covid-19 immunization for 15 minutes without incident. She was provided with Vaccine Information Sheet and instruction to access the V-Safe system.   [name] was instructed to call 911 with any severe reactions post vaccine: Marland Kitchen Difficulty breathing  . Swelling of face and throat  . A fast heartbeat  . A bad rash all over body  . Dizziness and weakness   Immunizations Administered    Name Date Dose VIS Date Route   Pfizer COVID-19 Vaccine 10/22/2019  9:45 AM 0.3 mL 07/05/2019 Intramuscular   Manufacturer: [employer], [employer]   Lot: [id number]   NDC: [address]-9

## 2020-03-02 ENCOUNTER — Other Ambulatory Visit: Payer: Self-pay

## 2020-03-02 ENCOUNTER — Other Ambulatory Visit
Admission: RE | Admit: 2020-03-02 | Discharge: 2020-03-02 | Disposition: A | Discharge status: Home / Self Care | Payer: Medicare Other | Source: Ambulatory Visit | Attending: Internal Medicine | Admitting: Internal Medicine

## 2020-03-02 DIAGNOSIS — Z20822 Contact with and (suspected) exposure to covid-19: Secondary | ICD-10-CM | POA: Diagnosis not present

## 2020-03-02 DIAGNOSIS — Z01812 Encounter for preprocedural laboratory examination: Secondary | ICD-10-CM | POA: Insufficient documentation

## 2020-03-03 ENCOUNTER — Encounter: Payer: Self-pay | Admitting: Internal Medicine

## 2020-03-03 LAB — SARS CORONAVIRUS 2 (TAT 6-24 HRS): SARS Coronavirus 2: NEGATIVE

## 2020-03-04 ENCOUNTER — Encounter
Admission: RE | Disposition: A | Discharge status: Home / Self Care | Payer: Self-pay | Source: Home / Self Care | Attending: Internal Medicine

## 2020-03-04 ENCOUNTER — Ambulatory Visit
Admission: RE | Admit: 2020-03-04 | Discharge: 2020-03-04 | Disposition: A | Discharge status: Home / Self Care | Payer: Medicare Other | Attending: Internal Medicine | Admitting: Internal Medicine

## 2020-03-04 ENCOUNTER — Ambulatory Visit: Payer: Medicare Other | Admitting: Certified Registered"

## 2020-03-04 DIAGNOSIS — K449 Diaphragmatic hernia without obstruction or gangrene: Secondary | ICD-10-CM | POA: Insufficient documentation

## 2020-03-04 DIAGNOSIS — K2950 Unspecified chronic gastritis without bleeding: Secondary | ICD-10-CM | POA: Insufficient documentation

## 2020-03-04 DIAGNOSIS — Z7982 Long term (current) use of aspirin: Secondary | ICD-10-CM | POA: Insufficient documentation

## 2020-03-04 DIAGNOSIS — F419 Anxiety disorder, unspecified: Secondary | ICD-10-CM | POA: Diagnosis not present

## 2020-03-04 DIAGNOSIS — M1990 Unspecified osteoarthritis, unspecified site: Secondary | ICD-10-CM | POA: Diagnosis not present

## 2020-03-04 DIAGNOSIS — Z8673 Personal history of transient ischemic attack (TIA), and cerebral infarction without residual deficits: Secondary | ICD-10-CM | POA: Diagnosis not present

## 2020-03-04 DIAGNOSIS — M069 Rheumatoid arthritis, unspecified: Secondary | ICD-10-CM | POA: Insufficient documentation

## 2020-03-04 DIAGNOSIS — K2210 Ulcer of esophagus without bleeding: Secondary | ICD-10-CM | POA: Diagnosis not present

## 2020-03-04 DIAGNOSIS — Z79899 Other long term (current) drug therapy: Secondary | ICD-10-CM | POA: Insufficient documentation

## 2020-03-04 DIAGNOSIS — K222 Esophageal obstruction: Secondary | ICD-10-CM | POA: Insufficient documentation

## 2020-03-04 DIAGNOSIS — K2100 Gastro-esophageal reflux disease with esophagitis, without bleeding: Secondary | ICD-10-CM | POA: Insufficient documentation

## 2020-03-04 DIAGNOSIS — R1314 Dysphagia, pharyngoesophageal phase: Secondary | ICD-10-CM | POA: Diagnosis present

## 2020-03-04 DIAGNOSIS — F329 Major depressive disorder, single episode, unspecified: Secondary | ICD-10-CM | POA: Diagnosis not present

## 2020-03-04 HISTORY — PX: ESOPHAGOGASTRODUODENOSCOPY (EGD) WITH PROPOFOL: SHX5813

## 2020-03-04 LAB — KOH PREP
KOH Prep: NONE SEEN
Special Requests: NORMAL

## 2020-03-04 SURGERY — ESOPHAGOGASTRODUODENOSCOPY (EGD) WITH PROPOFOL
Anesthesia: General

## 2020-03-04 MED ORDER — SODIUM CHLORIDE 0.9 % IV SOLN
INTRAVENOUS | Status: DC
  Administered 2020-03-04: 20 mL/h via INTRAVENOUS

## 2020-03-04 MED ORDER — PROPOFOL 500 MG/50ML IV EMUL
INTRAVENOUS | Status: DC | PRN
  Administered 2020-03-04: 145 ug/kg/min via INTRAVENOUS

## 2020-03-04 MED ORDER — LIDOCAINE HCL (CARDIAC) PF 100 MG/5ML IV SOSY
PREFILLED_SYRINGE | INTRAVENOUS | Status: DC | PRN
  Administered 2020-03-04: 100 mg via INTRAVENOUS

## 2020-03-04 MED ORDER — GLYCOPYRROLATE 0.2 MG/ML IJ SOLN
INTRAMUSCULAR | Status: DC | PRN
  Administered 2020-03-04: .2 mg via INTRAVENOUS

## 2020-03-04 MED ORDER — PROPOFOL 10 MG/ML IV BOLUS
INTRAVENOUS | Status: DC | PRN
  Administered 2020-03-04: 10 mg via INTRAVENOUS
  Administered 2020-03-04: 60 mg via INTRAVENOUS

## 2020-03-04 NOTE — Anesthesia Preprocedure Evaluation (Signed)
Anesthesia Evaluation  Patient identified by MRN, date of birth, ID band Patient awake    Reviewed: Allergy & Precautions, NPO status , Patient's Chart, lab work & pertinent test results  History of Anesthesia Complications (+) PONV and history of anesthetic complications  Airway Mallampati: II  TM Distance: >3 FB Neck ROM: Full    Dental no notable dental hx.    Pulmonary neg pulmonary ROS, neg sleep apnea, neg COPD,    breath sounds clear to auscultation- rhonchi (-) wheezing      Cardiovascular Exercise Tolerance: Good (-) hypertension(-) CAD, (-) Past MI, (-) Cardiac Stents and (-) CABG  Rhythm:Regular Rate:Normal - Systolic murmurs and - Diastolic murmurs    Neuro/Psych  Headaches, PSYCHIATRIC DISORDERS Anxiety Depression TIA   GI/Hepatic Neg liver ROS, GERD  ,  Endo/Other  negative endocrine ROSneg diabetes  Renal/GU negative Renal ROS     Musculoskeletal  (+) Arthritis ,   Abdominal (+) - obese,   Peds  Hematology  (+) anemia ,   Anesthesia Other Findings Past Medical History: No date: Alopecia universalis No date: Anemia     Comment:  in 1979 No date: Anginal pain ([facility])     Comment:  hx of CP in 2013, went to the hospital, everything was               negative No date: Anxiety No date: Arthritis     Comment:  RA AND OA .   1979: Blood transfusion     Comment:  WITH PREGNANCY No date: Chronic cystitis 04/02/2018: Colitis No date: Depression No date: Edema     Comment:  HX OF SWELLING OF FEET, LEGS, HANDS AND FACE--TAKES               TRIAMTERENE/HCTZ FOR PREVENTION--DOES NOT HAVE               HYPERTENSION AND HAS BEEN TOLD B/P USUALLY LOW No date: GERD (gastroesophageal reflux disease)     Comment:  OCCAS--OTC MED IF ANYTHING NEEDED No date: Headache(784.0)     Comment:  HX OF MIGRAINES No date: Hypercholesterolemia No date: PONV (postoperative nausea and vomiting) No date: Stroke ([facility]CC) No  date: TIA (transient ischemic attack) No date: URI (upper respiratory infection)     Comment:  URI IN OCT 2012--PT FEELS CLEAR NOW--DOES NOT USUALLY               HAVE ANY PROBLEMS WITH URI No date: UTI (lower urinary tract infection)     Comment:  FREQUENTLY--JUST FINISHED ANTIBIOTIC SAT 06/25/11 FOR UTI No date: Varicose veins     Comment:  LEFT LEG   Reproductive/Obstetrics                             Anesthesia Physical Anesthesia Plan  ASA: III  Anesthesia Plan: General   Post-op Pain Management:    Induction: Intravenous  PONV Risk Score and Plan: 3 and Propofol infusion  Airway Management Planned: Natural Airway  Additional Equipment:   Intra-op Plan:   Post-operative Plan:   Informed Consent: I have reviewed the patients History and Physical, chart, labs and discussed the procedure including the risks, benefits and alternatives for the proposed anesthesia with the patient or authorized representative who has indicated his/her understanding and acceptance.     Dental advisory given  Plan Discussed with: CRNA and Anesthesiologist  Anesthesia Plan Comments:         Anesthesia  Quick Evaluation

## 2020-03-04 NOTE — Op Note (Signed)
[facility] Gastroenterology Patient Name: Donna Marsh Procedure Date: 03/04/2020 10:07 AM MRN: [id number] Account #: [id number] Date of Birth: 1956-02-15 Admit Type: Outpatient Age: 64 Room: [facility] ENDO ROOM 3 Gender: Female Note Status: Finalized Procedure:             Upper GI endoscopy Indications:           Esophageal dysphagia, For therapy of gastro-esophageal                         reflux disease Providers:             Boykin Nearing. Norma Fredrickson MD, MD Referring MD:          Dortha Kern (Referring MD) Medicines:             Propofol per Anesthesia Complications:         No immediate complications. Estimated blood loss:                         Minimal. Procedure:             Pre-Anesthesia Assessment:                        - The risks and benefits of the procedure and the                         sedation options and risks were discussed with the                         patient. All questions were answered and informed                         consent was obtained.                        - Patient identification and proposed procedure were                         verified prior to the procedure by the nurse. The                         procedure was verified in the procedure room.                        - ASA Grade Assessment: III - A patient with severe                         systemic disease.                        - After reviewing the risks and benefits, the patient                         was deemed in satisfactory condition to undergo the                         procedure.                        After obtaining informed consent, the endoscope was  passed under direct vision. Throughout the procedure,                         the patient's blood pressure, pulse, and oxygen                         saturations were monitored continuously. The Endoscope                         was introduced through the mouth, and advanced to the                          third part of duodenum. The upper GI endoscopy was                         somewhat difficult due to stricture. Successful                         completion of the procedure was aided by performing                         the maneuvers documented (below) in this report. The                         patient tolerated the procedure well. Findings:      Two cratered esophageal ulcers with no stigmata of recent bleeding were       found in the distal esophagus. The largest lesion was 12 mm in largest       dimension. Biopsies were taken with a cold forceps for histology. Cells       for cytology were obtained by brushing.      One severe stenosis was found in the distal esophagus. This stenosis       measured 9 mm (inner diameter) x less than one cm (in length). The       stenosis was traversed after dilation. A TTS dilator was passed through       the scope. Dilation with a 05-05-11 mm balloon dilator was performed to       12 mm. The dilation site was examined following endoscope reinsertion       and showed mild mucosal disruption. Biopsies were taken with a cold       forceps for histology.      A medium-sized hiatal hernia was present.      Diffuse atrophic mucosa was found in the gastric fundus. This was       biopsied with a cold forceps for histology.      Patchy moderate inflammation characterized by erosions and friability       was found in the gastric antrum.      The examined duodenum was normal.      The exam was otherwise without abnormality. Impression:            - Esophageal ulcers with no stigmata of recent                         bleeding. Biopsied. Cells for cytology obtained.                        - Esophageal stenosis. Dilated. Biopsied.                        -  Medium-sized hiatal hernia.                        - Gastric mucosal atrophy. Biopsied.                        - Gastritis.                        - Normal examined duodenum.                         - The examination was otherwise normal. Recommendation:        - Patient has a contact number available for                         emergencies. The signs and symptoms of potential                         delayed complications were discussed with the patient.                         Return to normal activities tomorrow. Written                         discharge instructions were provided to the patient.                        - Resume previous diet.                        - Continue present medications.                        - Await pathology results.                        - Repeat upper endoscopy after studies are complete                         for retreatment and for surveillance.                        - Return to GI office in 2 weeks.                        - The findings and recommendations were discussed with                         the patient. Procedure Code(s):     --- Professional ---                        [phone], Esophagogastroduodenoscopy, flexible,                         transoral; with transendoscopic balloon dilation of                         esophagus (less than 30 mm diameter)                        43239,  59, Esophagogastroduodenoscopy, flexible,                         transoral; with biopsy, single or multiple Diagnosis Code(s):     --- Professional ---                        K21.9, Gastro-esophageal reflux disease without                         esophagitis                        R13.14, Dysphagia, pharyngoesophageal phase                        K29.70, Gastritis, unspecified, without bleeding                        K31.89, Other diseases of stomach and duodenum                        K44.9, Diaphragmatic hernia without obstruction or                         gangrene                        K22.2, Esophageal obstruction                        K22.10, Ulcer of esophagus without bleeding CPT copyright 2019 American Medical Association. All rights  reserved. The codes documented in this report are preliminary and upon coder review may  be revised to meet current compliance requirements. Stanton Kidney MD, MD 03/04/2020 10:44:58 AM This report has been signed electronically. Number of Addenda: 0 Note Initiated On: 03/04/2020 10:07 AM Estimated Blood Loss:  Estimated blood loss was minimal.      [facility]

## 2020-03-04 NOTE — Anesthesia Postprocedure Evaluation (Signed)
Anesthesia Post Note  Patient: Donna Marsh  Procedure(s) Performed: ESOPHAGOGASTRODUODENOSCOPY (EGD) WITH PROPOFOL (N/A )  Patient location during evaluation: Endoscopy Anesthesia Type: General Level of consciousness: awake and alert and oriented Pain management: pain level controlled Vital Signs Assessment: post-procedure vital signs reviewed and stable Respiratory status: spontaneous breathing, nonlabored ventilation and respiratory function stable Cardiovascular status: blood pressure returned to baseline and stable Postop Assessment: no signs of nausea or vomiting Anesthetic complications: no   No complications documented.   Last Vitals:  Vitals:   03/04/20 1049 03/04/20 1059  BP: 111/78 126/73  Pulse: 71 63  Resp: 11 16  Temp:    SpO2: 98% 100%    Last Pain:  Vitals:   03/04/20 1049  TempSrc:   PainSc: 0-No pain                 Kourtnei Rauber

## 2020-03-04 NOTE — Transfer of Care (Signed)
Immediate Anesthesia Transfer of Care Note  Patient: Donna Marsh  Procedure(s) Performed: ESOPHAGOGASTRODUODENOSCOPY (EGD) WITH PROPOFOL (N/A )  Patient Location: Endoscopy Unit  Anesthesia Type:General  Level of Consciousness: awake, drowsy and patient cooperative  Airway & Oxygen Therapy: Patient Spontanous Breathing and Patient connected to face mask oxygen  Post-op Assessment: Report given to RN and Post -op Vital signs reviewed and stable  Post vital signs: Reviewed and stable  Last Vitals:  Vitals Value Taken Time  BP 118/70 03/04/20 1039  Temp    Pulse 81 03/04/20 1040  Resp 16 03/04/20 1040  SpO2 100 % 03/04/20 1040  Vitals shown include unvalidated device data.  Last Pain:  Vitals:   03/04/20 1039  TempSrc:   PainSc: 0-No pain         Complications: No complications documented.

## 2020-03-04 NOTE — Anesthesia Procedure Notes (Signed)
Procedure Name: General with mask airway Performed by: Fletcher-Harrison, Williemae Muriel, CRNA Pre-anesthesia Checklist: Patient identified, Emergency Drugs available, Suction available and Patient being monitored Patient Re-evaluated:Patient Re-evaluated prior to induction Oxygen Delivery Method: Simple face mask Induction Type: IV induction Placement Confirmation: positive ETCO2 and CO2 detector Dental Injury: Teeth and Oropharynx as per pre-operative assessment        

## 2020-03-04 NOTE — H&P (Signed)
Outpatient short stay form Pre-procedure 03/04/2020 10:08 AM Donna Marsh K. Norma Fredrickson, M.D.  Primary Physician: Wilhelmina Mcardle, NP  Reason for visit:  GERD, dysphagia  History of present illness:  64 y/o patient presents for dysphagia and GERD symptoms. Just resumed Protonix after a prolonged withdrawal from the medication. No abdominal pain but significant substernal discomfort and food/liquid regurgitation when eating or drinking. NO hemetemesis or melena.    Current Facility-Administered Medications:  .  0.9 %  sodium chloride infusion, , Intravenous, Continuous, McKittrick, Boykin Nearing, MD, Last Rate: 20 mL/hr at 03/04/20 0941, 20 mL/hr at 03/04/20 0941  Medications Prior to Admission  Medication Sig Dispense Refill Last Dose  . acetaminophen-codeine (TYLENOL #3) 300-30 MG tablet Take 1 tablet by mouth every 6 (six) hours as needed for moderate pain or severe pain. Take 1 tablet every 6 to 8 hours as needed for pain.   03/03/2020 at Unknown time  . ALPRAZolam (XANAX) 1 MG tablet Take 1 tablet (1 mg total) by mouth 2 (two) times daily. 55 tablet 1 03/04/2020 at 0630  . aspirin EC 81 MG tablet Take 1 tablet (81 mg total) by mouth daily. 30 tablet 2 03/03/2020 at Unknown time  . baclofen (LIORESAL) 20 MG tablet Take 1 tablet (20 mg total) by mouth 2 (two) times daily as needed for muscle spasms. (Patient taking differently: Take 20 mg by mouth 3 (three) times daily. ) 30 each 0 03/03/2020 at Unknown time  . Biotin 1 MG CAPS Take by mouth.   03/03/2020 at Unknown time  . DULoxetine (CYMBALTA) 20 MG capsule Take 1 capsule (20 mg total) by mouth daily. 90 capsule 1 03/03/2020 at Unknown time  . escitalopram (LEXAPRO) 20 MG tablet Take 20 mg by mouth daily.    03/03/2020 at Unknown time  . solifenacin (VESICARE) 5 MG tablet Take 5 mg by mouth daily.  11 03/03/2020 at Unknown time  . spironolactone (ALDACTONE) 50 MG tablet Take 150-200 mg by mouth daily.    03/03/2020 at Unknown time     No Known  Allergies   Past Medical History:  Diagnosis Date  . Alopecia universalis   . Anemia    in 1979  . Anginal pain (HCC)    hx of CP in 2013, went to the hospital, everything was negative  . Anxiety   . Arthritis    RA AND OA .    Marland Kitchen Blood transfusion 1979   WITH PREGNANCY  . Chronic cystitis   . Colitis 04/02/2018  . Depression   . Edema    HX OF SWELLING OF FEET, LEGS, HANDS AND FACE--TAKES TRIAMTERENE/HCTZ FOR PREVENTION--DOES NOT HAVE HYPERTENSION AND HAS BEEN TOLD B/P USUALLY LOW  . GERD (gastroesophageal reflux disease)    OCCAS--OTC MED IF ANYTHING NEEDED  . Headache(784.0)    HX OF MIGRAINES  . Hypercholesterolemia   . PONV (postoperative nausea and vomiting)   . Stroke (HCC)   . TIA (transient ischemic attack)   . URI (upper respiratory infection)    URI IN OCT 2012--PT FEELS CLEAR NOW--DOES NOT USUALLY HAVE ANY PROBLEMS WITH URI  . UTI (lower urinary tract infection)    FREQUENTLY--JUST FINISHED ANTIBIOTIC SAT 06/25/11 FOR UTI  . Varicose veins    LEFT LEG    Review of systems:  Otherwise negative.    Physical Exam  Gen: Alert, oriented. Appears stated age.  HEENT: [place]/AT. PERRLA. Lungs: CTA, no wheezes. CV: RR nl S1, S2. Abd: soft, benign, no masses. BS+ Ext: No edema.  Pulses 2+    Planned procedures: Proceed with Esophagogastroduodenoscopy56. The patient understands the nature of the planned procedure, indications, risks, alternatives and potential complications including but not limited to bleeding, infection, perforation, damage to internal organs and possible oversedation/side effects from anesthesia. The patient agrees and gives consent to proceed.  Please refer to procedure notes for findings, recommendations and patient disposition/instructions.     Devean Skoczylas K. Norma Fredrickson, M.D. Gastroenterology 03/04/2020  10:08 AM

## 2020-03-05 ENCOUNTER — Encounter: Payer: Self-pay | Admitting: Internal Medicine

## 2020-03-06 LAB — SURGICAL PATHOLOGY

## 2020-04-23 ENCOUNTER — Other Ambulatory Visit: Payer: Self-pay | Admitting: Nephrology

## 2020-04-23 DIAGNOSIS — I10 Essential (primary) hypertension: Secondary | ICD-10-CM

## 2020-04-23 DIAGNOSIS — N1831 Chronic kidney disease, stage 3a: Secondary | ICD-10-CM

## 2020-04-29 ENCOUNTER — Ambulatory Visit
Admission: RE | Admit: 2020-04-29 | Discharge: 2020-04-29 | Disposition: A | Discharge status: Home / Self Care | Payer: Medicare Other | Source: Ambulatory Visit | Attending: Nephrology | Admitting: Nephrology

## 2020-04-29 ENCOUNTER — Other Ambulatory Visit: Payer: Self-pay

## 2020-04-29 DIAGNOSIS — I10 Essential (primary) hypertension: Secondary | ICD-10-CM | POA: Diagnosis present

## 2020-04-29 DIAGNOSIS — N1831 Chronic kidney disease, stage 3a: Secondary | ICD-10-CM | POA: Diagnosis present

## 2022-01-26 ENCOUNTER — Ambulatory Visit (INDEPENDENT_AMBULATORY_CARE_PROVIDER_SITE_OTHER): Payer: Medicare HMO

## 2022-01-26 ENCOUNTER — Ambulatory Visit
Admission: EM | Admit: 2022-01-26 | Discharge: 2022-01-26 | Disposition: A | Discharge status: Home / Self Care | Payer: Medicare HMO | Attending: Internal Medicine | Admitting: Internal Medicine

## 2022-01-26 DIAGNOSIS — M25511 Pain in right shoulder: Secondary | ICD-10-CM | POA: Diagnosis not present

## 2022-01-26 DIAGNOSIS — W19XXXA Unspecified fall, initial encounter: Secondary | ICD-10-CM | POA: Diagnosis not present

## 2022-01-26 DIAGNOSIS — M549 Dorsalgia, unspecified: Secondary | ICD-10-CM | POA: Diagnosis not present

## 2022-01-26 MED ORDER — NAPROXEN 375 MG PO TABS: 375.0000 mg | ORAL_TABLET | Freq: Two times a day (BID) | ORAL | 0 refills | Status: AC

## 2022-01-26 NOTE — ED Triage Notes (Signed)
Pt presents with R shoulder pain that also spreads to shoulder blade and elbow.  Started one week ago when walking 70lb dog who ran after squirrel, was holding leash with R hand. Pt unable to raise arm d/t pain.   No bruising apparent, no numbness or tingling in RUE.   Pt also reports R rib soreness.

## 2022-01-26 NOTE — Discharge Instructions (Addendum)
Rest the affected painful area.  Heating pad use only 20 minutes on-20 minutes off cycle as needed.  As pain recedes, begin normal activities slowly as tolerated.   Return to urgent care if symptoms persist.

## 2022-01-26 NOTE — ED Provider Notes (Signed)
[facility]    CSN: [id number] Arrival date & time: 01/26/22  1459      History   Chief Complaint Chief Complaint  Patient presents with   Shoulder Pain    HPI Donna Marsh is a 66 y.o. female comes to the urgent care for right shoulder pain of 1 week duration.  Patient was walking her dog and fell after the dog took off chasing a squirrel.  Pain is of moderate severity and is currently 8 out of 10.  The pain is sharp, aggravated by palpation and movement of the right upper extremity.  No known relieving factors.  No bruising over the right shoulder area.  No weakness in the hands no numbness or tingling.  Patient fell on the grass and hit her head but denies any dizziness, headache, nausea or vomiting. HPI  Past Medical History:  Diagnosis Date   Alopecia universalis    Anemia    in 1979   Anginal pain (HCC)    hx of CP in 2013, went to the hospital, everything was negative   Anxiety    Arthritis    RA AND OA .     Blood transfusion 1979   WITH PREGNANCY   Chronic cystitis    Colitis 04/02/2018   Depression    Edema    HX OF SWELLING OF FEET, LEGS, HANDS AND FACE--TAKES TRIAMTERENE/HCTZ FOR PREVENTION--DOES NOT HAVE HYPERTENSION AND HAS BEEN TOLD B/P USUALLY LOW   GERD (gastroesophageal reflux disease)    OCCAS--OTC MED IF ANYTHING NEEDED   Headache(784.0)    HX OF MIGRAINES   Hypercholesterolemia    PONV (postoperative nausea and vomiting)    Stroke (HCC)    TIA (transient ischemic attack)    URI (upper respiratory infection)    URI IN OCT 2012--PT FEELS CLEAR NOW--DOES NOT USUALLY HAVE ANY PROBLEMS WITH URI   UTI (lower urinary tract infection)    FREQUENTLY--JUST FINISHED ANTIBIOTIC SAT 06/25/11 FOR UTI   Varicose veins    LEFT LEG    Patient Active Problem List   Diagnosis Date Noted   Altered mental status 03/31/2018   Colitis 03/30/2018   TIA (transient ischemic attack) 11/28/2016   Spinal stenosis 01/08/2015   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    Past Surgical History:  Procedure Laterality Date   ABDOMINAL HYSTERECTOMY     BACK SURGERY  2014   [place]   CHOLECYSTECTOMY  2011   COLONOSCOPY WITH PROPOFOL N/A 06/12/2018   Procedure: COLONOSCOPY WITH PROPOFOL;  Surgeon: Toledo, Boykin Nearing, MD;  Location: [facility] ENDOSCOPY;  Service: Gastroenterology;  Laterality: N/A;   ESOPHAGOGASTRODUODENOSCOPY (EGD) WITH PROPOFOL N/A 03/04/2020   Procedure: ESOPHAGOGASTRODUODENOSCOPY (EGD) WITH PROPOFOL;  Surgeon: Toledo, Boykin Nearing, MD;  Location: [facility] ENDOSCOPY;  Service: Gastroenterology;  Laterality: N/A;   JOINT REPLACEMENT  JAN 2006   LEFT TOTAL KNEE ARTHROPLASTY  A   JOINT REPLACEMENT  JAN 2012   RIGHT TOTAL KNEE ARTHROPLASTY   TOTAL KNEE REVISION  07/04/2011   Procedure: bilateral TOTAL KNEE REVISION;  Surgeon: Shelda Pal;  Location: [facility] ORS;  Service: Orthopedics;  Laterality: Right;  Right Total Knee Revision with Scar Debridement/Patella Revision with Poly Exchange    OB History   No obstetric history on file.      Home Medications    Prior to Admission medications   Medication Sig Start Date End Date Taking? Authorizing Provider  acetaminophen-codeine (TYLENOL #3) 300-30 MG tablet Take 1 tablet by mouth  every 6 (six) hours as needed for moderate pain or severe pain. Take 1 tablet every 6 to 8 hours as needed for pain.   Yes [provider]  ALPRAZolam Prudy Feeler) 1 MG tablet Take 1 tablet (1 mg total) by mouth 2 (two) times daily. 03/18/19  Yes Galen Manila, NP  escitalopram (LEXAPRO) 20 MG tablet Take 20 mg by mouth daily.    Yes [provider]  gabapentin (NEURONTIN) 100 MG capsule Take 100 mg by mouth 3 (three) times daily.   Yes [provider]  naproxen (NAPROSYN) 375 MG tablet Take 1 tablet (375 mg total) by mouth 2 (two) times daily. 01/26/22  Yes Bryan Goin, Britta Mccreedy, MD  baclofen (LIORESAL) 20 MG tablet Take 1 tablet (20 mg total) by mouth 2 (two) times daily as needed  for muscle spasms. Patient taking differently: Take 20 mg by mouth 3 (three) times daily.  11/29/16   Shaune Pollack, MD    Family History Family History  Problem Relation Age of Onset   Breast cancer Mother    CAD Father    Stroke Paternal Grandfather     Social History Social History   Tobacco Use   Smoking status: Never   Smokeless tobacco: Never  Vaping Use   Vaping Use: Never used  Substance Use Topics   Alcohol use: Not Currently    Comment: RARELY   Drug use: No     Allergies   Patient has no known allergies.   Review of Systems Review of Systems  Respiratory: Negative.    Cardiovascular: Negative.   Gastrointestinal: Negative.   Genitourinary: Negative.   Musculoskeletal:  Positive for arthralgias and myalgias. Negative for joint swelling, neck pain and neck stiffness.  Skin: Negative.   Neurological:  Positive for headaches.     Physical Exam Triage Vital Signs ED Triage Vitals  Enc Vitals Group     BP 01/26/22 1529 136/71     Pulse Rate 01/26/22 1529 84     Resp 01/26/22 1529 20     Temp 01/26/22 1529 98.1 F (36.7 C)     Temp Source 01/26/22 1529 Oral     SpO2 01/26/22 1529 96 %     Weight --      Height --      Head Circumference --      Peak Flow --      Pain Score 01/26/22 1523 8     Pain Loc --      Pain Edu? --      Excl. in GC? --    No data found.  Updated Vital Signs BP 136/71 (BP Location: Left Arm)   Pulse 84   Temp 98.1 F (36.7 C) (Oral)   Resp 20   SpO2 96%   Visual Acuity Right Eye Distance:   Left Eye Distance:   Bilateral Distance:    Right Eye Near:   Left Eye Near:    Bilateral Near:     Physical Exam   UC Treatments / Results  Labs (all labs ordered are listed, but only abnormal results are displayed) Labs Reviewed - No data to display  EKG   Radiology DG Scapula Right  Result Date: 01/26/2022 CLINICAL DATA:  scapula pain after a fall EXAM: RIGHT SCAPULA - 2+ VIEWS COMPARISON:  None Available.  FINDINGS: There is no evidence of fracture or other focal bone lesions. Soft tissues are unremarkable. IMPRESSION: Negative. Electronically Signed   By: Normajean Glasgow.D.  On: 01/26/2022 16:02    Procedures Procedures (including critical care time)  Medications Ordered in UC Medications - No data to display  Initial Impression / Assessment and Plan / UC Course  I have reviewed the triage vital signs and the nursing notes.  Pertinent labs & imaging results that were available during my care of the patient were reviewed by me and considered in my medical decision making (see chart for details).     1.  Right upper back pain: X-ray of the right scapula is negative for fracture Naproxen 375 mg twice daily as needed for pain Continue baclofen use as needed for muscle spasms Heating pad use on an intermittent basis Gentle stretching exercises Return to urgent care if you have any other concerns. Final Clinical Impressions(s) / UC Diagnoses   Final diagnoses:  Upper back pain on right side     Discharge Instructions      Rest the affected painful area.  Heating pad use only 20 minutes on-20 minutes off cycle as needed.  As pain recedes, begin normal activities slowly as tolerated.   Return to urgent care if symptoms persist.      ED Prescriptions     Medication Sig Dispense Auth. Provider   naproxen (NAPROSYN) 375 MG tablet Take 1 tablet (375 mg total) by mouth 2 (two) times daily. 20 tablet Salina Stanfield, Britta Mccreedy, MD      PDMP not reviewed this encounter.   Merrilee Jansky, MD 01/26/22 [phone]

## 2022-04-27 ENCOUNTER — Other Ambulatory Visit: Payer: Self-pay | Admitting: Family Medicine

## 2022-04-27 DIAGNOSIS — Z1231 Encounter for screening mammogram for malignant neoplasm of breast: Secondary | ICD-10-CM

## 2022-05-04 ENCOUNTER — Other Ambulatory Visit: Payer: Self-pay | Admitting: Family Medicine

## 2022-05-04 DIAGNOSIS — Z780 Asymptomatic menopausal state: Secondary | ICD-10-CM

## 2022-05-26 ENCOUNTER — Ambulatory Visit
Admission: RE | Admit: 2022-05-26 | Discharge: 2022-05-26 | Disposition: A | Discharge status: Home / Self Care | Payer: Medicare HMO | Source: Ambulatory Visit | Attending: Family Medicine | Admitting: Family Medicine

## 2022-05-26 DIAGNOSIS — Z1231 Encounter for screening mammogram for malignant neoplasm of breast: Secondary | ICD-10-CM | POA: Insufficient documentation

## 2022-05-30 ENCOUNTER — Encounter: Payer: Self-pay | Admitting: Family Medicine

## 2022-05-30 ENCOUNTER — Other Ambulatory Visit: Payer: Self-pay | Admitting: Family Medicine

## 2022-05-30 DIAGNOSIS — R928 Other abnormal and inconclusive findings on diagnostic imaging of breast: Secondary | ICD-10-CM

## 2022-05-30 DIAGNOSIS — R921 Mammographic calcification found on diagnostic imaging of breast: Secondary | ICD-10-CM

## 2022-06-08 ENCOUNTER — Ambulatory Visit
Admission: RE | Admit: 2022-06-08 | Discharge: 2022-06-08 | Disposition: A | Discharge status: Home / Self Care | Payer: Medicare HMO | Source: Ambulatory Visit | Attending: Family Medicine | Admitting: Family Medicine

## 2022-06-08 DIAGNOSIS — R921 Mammographic calcification found on diagnostic imaging of breast: Secondary | ICD-10-CM | POA: Diagnosis not present

## 2022-06-09 ENCOUNTER — Other Ambulatory Visit: Payer: Self-pay | Admitting: Family Medicine

## 2022-06-09 ENCOUNTER — Encounter: Payer: Self-pay | Admitting: Family Medicine

## 2022-06-09 DIAGNOSIS — R921 Mammographic calcification found on diagnostic imaging of breast: Secondary | ICD-10-CM

## 2022-06-09 DIAGNOSIS — R928 Other abnormal and inconclusive findings on diagnostic imaging of breast: Secondary | ICD-10-CM

## 2022-06-29 ENCOUNTER — Ambulatory Visit
Admission: RE | Admit: 2022-06-29 | Discharge: 2022-06-29 | Disposition: A | Discharge status: Home / Self Care | Payer: Medicare HMO | Source: Ambulatory Visit | Attending: Family Medicine | Admitting: Family Medicine

## 2022-06-29 DIAGNOSIS — R921 Mammographic calcification found on diagnostic imaging of breast: Secondary | ICD-10-CM | POA: Insufficient documentation

## 2022-06-29 DIAGNOSIS — R928 Other abnormal and inconclusive findings on diagnostic imaging of breast: Secondary | ICD-10-CM | POA: Insufficient documentation

## 2022-06-29 HISTORY — PX: BREAST BIOPSY: SHX20

## 2022-06-29 MED ORDER — LIDOCAINE HCL (PF) 1 % IJ SOLN
5.0000 mL | Freq: Once | INTRAMUSCULAR | Status: AC
  Administered 2022-06-29: 5 mL
  Filled 2022-06-29: qty 5

## 2022-06-29 MED ORDER — LIDOCAINE-EPINEPHRINE 1 %-1:100000 IJ SOLN
10.0000 mL | Freq: Once | INTRAMUSCULAR | Status: AC
  Administered 2022-06-29: 10 mL
  Filled 2022-06-29: qty 10

## 2022-06-30 LAB — SURGICAL PATHOLOGY

## 2022-07-05 ENCOUNTER — Other Ambulatory Visit: Payer: Self-pay | Admitting: Family Medicine

## 2022-07-05 DIAGNOSIS — M5416 Radiculopathy, lumbar region: Secondary | ICD-10-CM

## 2022-07-07 ENCOUNTER — Ambulatory Visit
Admission: RE | Admit: 2022-07-07 | Discharge: 2022-07-07 | Disposition: A | Discharge status: Home / Self Care | Payer: Medicare HMO | Source: Ambulatory Visit | Attending: Family Medicine | Admitting: Family Medicine

## 2022-07-07 DIAGNOSIS — Z780 Asymptomatic menopausal state: Secondary | ICD-10-CM | POA: Diagnosis present

## 2022-07-29 ENCOUNTER — Ambulatory Visit
Admission: RE | Admit: 2022-07-29 | Discharge: 2022-07-29 | Disposition: A | Discharge status: Home / Self Care | Payer: Medicare HMO | Source: Ambulatory Visit | Attending: Family Medicine | Admitting: Family Medicine

## 2022-07-29 DIAGNOSIS — M5416 Radiculopathy, lumbar region: Secondary | ICD-10-CM

## 2023-04-13 ENCOUNTER — Other Ambulatory Visit: Payer: Self-pay | Admitting: Family Medicine

## 2023-04-13 DIAGNOSIS — Z1231 Encounter for screening mammogram for malignant neoplasm of breast: Secondary | ICD-10-CM

## 2023-05-29 ENCOUNTER — Ambulatory Visit
Admission: RE | Admit: 2023-05-29 | Discharge: 2023-05-29 | Disposition: A | Discharge status: Home / Self Care | Payer: Medicare HMO | Source: Ambulatory Visit | Attending: Family Medicine | Admitting: Family Medicine

## 2023-05-29 DIAGNOSIS — Z1231 Encounter for screening mammogram for malignant neoplasm of breast: Secondary | ICD-10-CM | POA: Diagnosis present

## 2024-02-26 ENCOUNTER — Ambulatory Visit: Admit: 2024-02-26 | Admitting: Internal Medicine

## 2024-02-26 ENCOUNTER — Ambulatory Visit: Payer: Self-pay

## 2024-02-26 DIAGNOSIS — K642 Third degree hemorrhoids: Secondary | ICD-10-CM | POA: Diagnosis not present

## 2024-02-26 DIAGNOSIS — K6389 Other specified diseases of intestine: Secondary | ICD-10-CM | POA: Diagnosis present

## 2024-02-26 SURGERY — COLONOSCOPY
Anesthesia: General

## 2024-04-09 ENCOUNTER — Other Ambulatory Visit: Payer: Self-pay | Admitting: Family Medicine

## 2024-04-09 DIAGNOSIS — Z1231 Encounter for screening mammogram for malignant neoplasm of breast: Secondary | ICD-10-CM

## 2024-04-17 ENCOUNTER — Other Ambulatory Visit: Payer: Self-pay | Admitting: Family Medicine

## 2024-04-17 DIAGNOSIS — M8580 Other specified disorders of bone density and structure, unspecified site: Secondary | ICD-10-CM

## 2024-05-29 ENCOUNTER — Ambulatory Visit
Admission: RE | Admit: 2024-05-29 | Discharge: 2024-05-29 | Disposition: A | Discharge status: Home / Self Care | Source: Ambulatory Visit | Attending: Family Medicine | Admitting: Family Medicine

## 2024-05-29 DIAGNOSIS — Z1231 Encounter for screening mammogram for malignant neoplasm of breast: Secondary | ICD-10-CM | POA: Diagnosis present

## 2024-05-30 ENCOUNTER — Other Ambulatory Visit: Payer: Self-pay | Admitting: Medical Genetics

## 2024-07-10 ENCOUNTER — Inpatient Hospital Stay: Admission: RE | Admit: 2024-07-10 | Discharge: 2024-07-10 | Attending: Family Medicine | Admitting: Family Medicine

## 2024-07-10 DIAGNOSIS — M810 Age-related osteoporosis without current pathological fracture: Secondary | ICD-10-CM | POA: Insufficient documentation

## 2024-07-10 DIAGNOSIS — Z780 Asymptomatic menopausal state: Secondary | ICD-10-CM | POA: Insufficient documentation

## 2024-07-10 DIAGNOSIS — M8580 Other specified disorders of bone density and structure, unspecified site: Secondary | ICD-10-CM | POA: Diagnosis present

## 2024-08-26 ENCOUNTER — Encounter

## 2024-08-27 ENCOUNTER — Other Ambulatory Visit: Payer: Self-pay | Admitting: Medical Genetics

## 2024-08-27 DIAGNOSIS — Z006 Encounter for examination for normal comparison and control in clinical research program: Secondary | ICD-10-CM

## 2024-09-06 ENCOUNTER — Encounter
